# Patient Record
Sex: Female | Born: 1985 | Race: White | Hispanic: No | Marital: Married | Smoking: Never smoker
Health system: Southern US, Community
[De-identification: ages and names within clinical notes are randomized; demographics above are authoritative.]

## PROBLEM LIST (undated history)

## (undated) HISTORY — PX: URETHRA SURGERY: SHX824

## (undated) HISTORY — PX: WISDOM TOOTH EXTRACTION: SHX21

## (undated) HISTORY — PX: OVARY SURGERY: SHX727

---
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
## 2008-01-03 ENCOUNTER — Emergency Department (HOSPITAL_COMMUNITY): Admission: EM | Admit: 2008-01-03 | Discharge: 2008-01-04 | Payer: Self-pay | Admitting: Emergency Medicine

## 2010-05-11 ENCOUNTER — Ambulatory Visit (HOSPITAL_COMMUNITY): Admission: RE | Admit: 2010-05-11 | Discharge: 2010-05-11 | Payer: Self-pay | Admitting: Orthopedic Surgery

## 2010-08-19 ENCOUNTER — Other Ambulatory Visit: Payer: Self-pay | Admitting: Obstetrics & Gynecology

## 2010-08-19 DIAGNOSIS — N9489 Other specified conditions associated with female genital organs and menstrual cycle: Secondary | ICD-10-CM

## 2010-08-19 DIAGNOSIS — R102 Pelvic and perineal pain: Secondary | ICD-10-CM

## 2010-08-20 ENCOUNTER — Ambulatory Visit
Admission: RE | Admit: 2010-08-20 | Discharge: 2010-08-20 | Disposition: A | Discharge status: Home / Self Care | Payer: BC Managed Care – PPO | Source: Ambulatory Visit | Attending: Obstetrics & Gynecology | Admitting: Obstetrics & Gynecology

## 2010-08-20 DIAGNOSIS — R102 Pelvic and perineal pain: Secondary | ICD-10-CM

## 2010-08-20 DIAGNOSIS — N9489 Other specified conditions associated with female genital organs and menstrual cycle: Secondary | ICD-10-CM

## 2010-08-20 MED ORDER — GADOBENATE DIMEGLUMINE 529 MG/ML IV SOLN
20.0000 mL | Freq: Once | INTRAVENOUS | Status: AC | PRN
  Administered 2010-08-20: 20 mL via INTRAVENOUS

## 2010-09-17 ENCOUNTER — Other Ambulatory Visit (HOSPITAL_COMMUNITY): Payer: Self-pay | Admitting: Anesthesiology

## 2010-09-17 ENCOUNTER — Encounter (HOSPITAL_COMMUNITY)
Admission: RE | Admit: 2010-09-17 | Discharge: 2010-09-17 | Disposition: A | Discharge status: Home / Self Care | Payer: BC Managed Care – PPO | Source: Ambulatory Visit | Attending: Obstetrics & Gynecology | Admitting: Obstetrics & Gynecology

## 2010-09-17 DIAGNOSIS — N838 Other noninflammatory disorders of ovary, fallopian tube and broad ligament: Secondary | ICD-10-CM

## 2010-09-17 LAB — SURGICAL PCR SCREEN
MRSA, PCR: NEGATIVE
Staphylococcus aureus: NEGATIVE

## 2010-09-22 ENCOUNTER — Ambulatory Visit (HOSPITAL_COMMUNITY)
Admission: RE | Admit: 2010-09-22 | Discharge: 2010-09-22 | Disposition: A | Discharge status: Home / Self Care | Payer: BC Managed Care – PPO | Source: Ambulatory Visit | Attending: Anesthesiology | Admitting: Anesthesiology

## 2010-09-22 ENCOUNTER — Other Ambulatory Visit (HOSPITAL_COMMUNITY): Payer: Self-pay | Admitting: Anesthesiology

## 2010-09-22 DIAGNOSIS — Z01818 Encounter for other preprocedural examination: Secondary | ICD-10-CM | POA: Insufficient documentation

## 2010-09-22 DIAGNOSIS — N838 Other noninflammatory disorders of ovary, fallopian tube and broad ligament: Secondary | ICD-10-CM

## 2010-09-22 DIAGNOSIS — N839 Noninflammatory disorder of ovary, fallopian tube and broad ligament, unspecified: Secondary | ICD-10-CM | POA: Insufficient documentation

## 2010-09-24 ENCOUNTER — Other Ambulatory Visit: Payer: Self-pay | Admitting: Obstetrics & Gynecology

## 2010-09-24 ENCOUNTER — Observation Stay (HOSPITAL_COMMUNITY)
Admission: RE | Admit: 2010-09-24 | Discharge: 2010-09-24 | Disposition: A | Discharge status: Home / Self Care | Payer: BC Managed Care – PPO | Source: Ambulatory Visit | Attending: Obstetrics & Gynecology | Admitting: Obstetrics & Gynecology

## 2010-09-24 DIAGNOSIS — D279 Benign neoplasm of unspecified ovary: Principal | ICD-10-CM | POA: Insufficient documentation

## 2010-09-24 LAB — CBC
HCT: 41.5 % (ref 36.0–46.0)
Hemoglobin: 13.7 g/dL (ref 12.0–15.0)
MCV: 83.5 fL (ref 78.0–100.0)
RDW: 12.9 % (ref 11.5–15.5)
WBC: 7.8 10*3/uL (ref 4.0–10.5)

## 2010-09-24 LAB — TYPE AND SCREEN
ABO/RH(D): B POS
Antibody Screen: NEGATIVE

## 2010-09-24 LAB — ABO/RH: ABO/RH(D): B POS

## 2010-09-26 ENCOUNTER — Inpatient Hospital Stay (HOSPITAL_COMMUNITY)
Admission: AD | Admit: 2010-09-26 | Discharge: 2010-09-26 | Disposition: A | Discharge status: Home / Self Care | Payer: BC Managed Care – PPO | Source: Ambulatory Visit | Attending: Obstetrics & Gynecology | Admitting: Obstetrics & Gynecology

## 2010-09-26 DIAGNOSIS — J069 Acute upper respiratory infection, unspecified: Secondary | ICD-10-CM | POA: Insufficient documentation

## 2010-09-26 DIAGNOSIS — R5082 Postprocedural fever: Secondary | ICD-10-CM | POA: Insufficient documentation

## 2010-09-26 LAB — URINALYSIS, ROUTINE W REFLEX MICROSCOPIC
Ketones, ur: NEGATIVE mg/dL
Leukocytes, UA: NEGATIVE
Nitrite: NEGATIVE
Protein, ur: NEGATIVE mg/dL
Urobilinogen, UA: 0.2 mg/dL (ref 0.0–1.0)

## 2010-09-26 LAB — CBC
MCH: 27.6 pg (ref 26.0–34.0)
MCHC: 32.2 g/dL (ref 30.0–36.0)
MCV: 85.7 fL (ref 78.0–100.0)
Platelets: 374 10*3/uL (ref 150–400)
RBC: 4.56 MIL/uL (ref 3.87–5.11)

## 2010-09-26 LAB — DIFFERENTIAL
Basophils Relative: 1 % (ref 0–1)
Eosinophils Absolute: 0.5 10*3/uL (ref 0.0–0.7)
Lymphs Abs: 4.2 10*3/uL — ABNORMAL HIGH (ref 0.7–4.0)
Monocytes Absolute: 0.9 10*3/uL (ref 0.1–1.0)
Monocytes Relative: 9 % (ref 3–12)
Neutrophils Relative %: 46 % (ref 43–77)

## 2010-09-26 LAB — URINE MICROSCOPIC-ADD ON

## 2010-10-05 ENCOUNTER — Other Ambulatory Visit: Payer: Self-pay | Admitting: Family Medicine

## 2010-10-05 DIAGNOSIS — R1900 Intra-abdominal and pelvic swelling, mass and lump, unspecified site: Secondary | ICD-10-CM

## 2010-10-06 ENCOUNTER — Ambulatory Visit
Admission: RE | Admit: 2010-10-06 | Discharge: 2010-10-06 | Disposition: A | Discharge status: Home / Self Care | Payer: BC Managed Care – PPO | Source: Ambulatory Visit | Attending: Family Medicine | Admitting: Family Medicine

## 2010-10-06 DIAGNOSIS — R1900 Intra-abdominal and pelvic swelling, mass and lump, unspecified site: Secondary | ICD-10-CM

## 2010-10-06 MED ORDER — GADOBENATE DIMEGLUMINE 529 MG/ML IV SOLN
17.0000 mL | Freq: Once | INTRAVENOUS | Status: AC | PRN
  Administered 2010-10-06: 17 mL via INTRAVENOUS

## 2010-11-11 NOTE — Op Note (Signed)
[name], [name]              ACCOUNT NO.:  [id number]  MEDICAL RECORD NO.:  [id number]           PATIENT TYPE:  O  LOCATION:  9316                          FACILITY:  [facility]  PHYSICIAN:  Genia Del, M.D.DATE OF BIRTH:  09-21-1985  DATE OF PROCEDURE:  09/24/2010 DATE OF DISCHARGE:  09/24/2010                              OPERATIVE REPORT   PREOPERATIVE DIAGNOSIS:  Left solid ovarian mass by MRI (2.9 cm).  POSTOPERATIVE DIAGNOSIS:  Absent left ovary and tube, pelvic adhesions between uterus and bladder and left colon and left abdominopelvic wall and right small bowel and right abdominal wall.  No left pelvic mass found.  PROCEDURE:  Laparoscopy with lysis of adhesions, right ovarian biopsy, left pelvic wall peritoneal biopsy assisted with da Vinci robot.  SURGEON:  Genia Del, MD  ASSISTANT:  Arlan Organ, MD  ANESTHESIOLOGIST:  Dana Allan, MD  PROCEDURE:  Under general anesthesia with endotracheal intubation, the patient was in lithotomy position.  She was prepped with Betadine on the abdominal, suprapubic, vulvar, and vaginal areas and draped as usual. The Foley was put in place in the bladder.  We did a vaginal exam under general anesthesia.  The uterus was anteverted, normal volume, no adnexal mass felt.  We did a hysterometry which is at 7 cm.  We used a #6 Rumi with a small coring.  This was inserted in the uterus without difficulty.  We then went to the abdomen.  We infiltrated the subcutaneous tissue with Marcaine 0.25 plain at the supraumbilical location.  We made a 1.5-cm incision with a scalpel at that level.  We then opened the aponeurosis with Mayo scissors under direct vision using the Kochers to grasp the aponeurosis.  We then opened the parietal peritoneum bluntly with the finger.  We put a pursestring stitch of Vicryl 0 at the aponeurosis.  We then inserted the Hasson and the camera at that level.  Pneumoperitoneum was created with CO2.  We  visualized the pelvic cavity.  The uterus appeared normal.  The adnexa were not visible at this point.  We saw no adhesion with the anterior wall.  We started with the left upper robotic port.  We marked the skin.  We infiltrated Marcaine 0.25 plain and made an 8-mm incision with the scalpel.  We inserted the robotic trocars under direct vision.  We inserted the Nezhat at that level to irrigate, suction, and do washings. This was sent to cytology.  We then put the right upper robotic port in. We marked the skin, infiltrated with the Marcaine 0.25 plain and made an 8-mm incision with a scalpel and inserted the robotic trocars under direct vision.  A probe was used at that level.  We inspected the pelvic cavity.  We noted a normal uterus, although the anterior wall of the uterus was adherent to the bladder over the whole width of the uterus. On the left side, the round ligament was present.  The left colon was adherent to the left abdominopelvic wall.  We were not able to see the left ovary and tube.  On the right side, the right ovary was slightly  prominent, but no lesion was seen.  No evidence of cyst.  No adhesion was present.  We also noted a normal right tube with fimbria.  Some adhesions were present on the right side between the small bowel and the right lateral abdominal wall.  At this point, it was decided to add a 5- mm assistant port and proceed with lysis of adhesions assisted with da Vinci robot and further evaluate the left pelvic area.  We infiltrated the subcutaneous tissue before the lower right assistant ports.  We made a 5-mm incision with the scalpel and inserted the 5-mm assistant ports under direct vision.  We then docked the robot on the left side as usual without difficulty.  We put the EndoShear scissor in the first arm and the PK in the second arm.  We went to the console.  We started with lysis of adhesions between the anterior uterus and the bladder.  We released  those adhesions completely.  We then started lysis of adhesion on the left pelvic wall and left abdominal wall between the colon and the wall.  We completed that lysis of adhesion without difficulty and completely freed the left pelvis even reaching above the pelvic rim and to our surprise no left adnexa was present.  Both the left ovary and the left tube were absent.  No mass was present in the left pelvic area. After putting that in evidence, we decided to call the radiologist at [facility] to review the pelvic MRI which concluded to left ovarian mass of 2.9 cm that was solid.  That MRI was reviewed and the radiologist confirmed a normal, but prominent right ovary and a 2.9 solid, fibrous mass on the left pelvic area.  Given that this description does not correspond to the anatomy found during surgery, we decided to take a biopsy of the right ovary.  This was done using the EndoShear scissors and the P K.  The biopsy was sent to pathology.  We then took a biopsy on the left abdominopelvic wall at site where a scar which could represent an old adhesion of endometriosis was present.  We took that biopsy and sent it to pathology.  We then further explored the right abdominal wall and noted that the small bowel was adherent on that side.  Lysis of adhesion was performed to release the small bowel.  The appendix was not visualized.  The liver appeared normal.  No adhesion was present at that level.  Irrigation was done.  Hemostasis was is adequate at all levels.  We therefore removed all instruments.  We undocked the robot and go by laparoscopy.  We irrigated and suctioned the abdominopelvic cavities.  We did note very small bleeders where the adhesions were released on the anterior uterus.  We used the Kleppinger to control those successfully.  We then used Interceed to prevent recurrence of adhesions on the left abdominopelvic wall.  We also used Interceed between the anterior uterus  and the bladder.  Hemostasis was adequate at all levels.  The instruments were therefore removed.  The CO2 was evacuated.  We closed the supraumbilical incision by attaching the pursestring stitch.  We then put 2 figure-of-eights with Vicryl 0 at the level of the adipose tissue at the supraumbilical incision to complete hemostasis.  We used electrocautery to complete hemostasis on all incisions.  We then closed the skin on all incisions with a subcuticular stitch of Vicryl 4-0 and used Dermabond on top.  The coring and Rumi  were removed.  The estimated blood loss was minimal.  No complications occurred and the patient was brought to recovery room in good stable status.     Genia Del, M.D.     ML/MEDQ  D:  09/24/2010  T:  09/25/2010  Job:  [id number]  Electronically Signed by Genia Del M.D. on 11/11/2010 05:03:14 PM

## 2011-03-12 LAB — PREGNANCY, URINE: Preg Test, Ur: NEGATIVE

## 2011-03-12 LAB — URINALYSIS, ROUTINE W REFLEX MICROSCOPIC
Bilirubin Urine: NEGATIVE
Glucose, UA: NEGATIVE
Nitrite: NEGATIVE
Protein, ur: NEGATIVE
pH: 6

## 2011-03-12 LAB — URINE MICROSCOPIC-ADD ON

## 2012-05-16 ENCOUNTER — Encounter (HOSPITAL_COMMUNITY): Payer: Self-pay | Admitting: Emergency Medicine

## 2012-05-16 ENCOUNTER — Emergency Department (HOSPITAL_COMMUNITY)
Admission: EM | Admit: 2012-05-16 | Discharge: 2012-05-16 | Disposition: A | Discharge status: Home / Self Care | Payer: BC Managed Care – PPO | Attending: Emergency Medicine | Admitting: Emergency Medicine

## 2012-05-16 DIAGNOSIS — Z77098 Contact with and (suspected) exposure to other hazardous, chiefly nonmedicinal, chemicals: Secondary | ICD-10-CM | POA: Insufficient documentation

## 2012-05-16 DIAGNOSIS — Z79899 Other long term (current) drug therapy: Secondary | ICD-10-CM | POA: Insufficient documentation

## 2012-05-16 DIAGNOSIS — R51 Headache: Secondary | ICD-10-CM | POA: Insufficient documentation

## 2012-05-16 DIAGNOSIS — R5381 Other malaise: Secondary | ICD-10-CM | POA: Insufficient documentation

## 2012-05-16 DIAGNOSIS — J00 Acute nasopharyngitis [common cold]: Secondary | ICD-10-CM | POA: Insufficient documentation

## 2012-05-16 DIAGNOSIS — J3489 Other specified disorders of nose and nasal sinuses: Secondary | ICD-10-CM | POA: Insufficient documentation

## 2012-05-16 DIAGNOSIS — R110 Nausea: Secondary | ICD-10-CM | POA: Insufficient documentation

## 2012-05-16 MED ORDER — ACETAMINOPHEN 325 MG PO TABS
650.0000 mg | ORAL_TABLET | Freq: Once | ORAL | Status: AC
  Administered 2012-05-16: 650 mg via ORAL
  Filled 2012-05-16: qty 2

## 2012-05-16 MED ORDER — ONDANSETRON 4 MG PO TBDP
4.0000 mg | ORAL_TABLET | Freq: Once | ORAL | Status: AC
  Administered 2012-05-16: 4 mg via ORAL
  Filled 2012-05-16: qty 1

## 2012-05-16 NOTE — ED Provider Notes (Signed)
History     CSN: [id number]  Arrival date & time 05/16/12  1639   First MD Initiated Contact with Patient 05/16/12 1955      Chief Complaint  Patient presents with  . Chemical Exposure    (Consider location/radiation/quality/duration/timing/severity/associated sxs/prior treatment) HPI Comments: Patient had exposure to Meth while cleaning out a home where it was "cooked" about 1500 today since than has had HA, runny nose, nausea and generally felling weak.   The history is provided by the patient.    History reviewed. No pertinent past medical history.  Past Surgical History  Procedure Date  . Urethra surgery   . Wisdom tooth extraction   . Ovary surgery     History reviewed. No pertinent family history.  History  Substance Use Topics  . Smoking status: Never Smoker   . Smokeless tobacco: Not on file  . Alcohol Use: No    OB History    Grav Para Term Preterm Abortions TAB SAB Ect Mult Living                  Review of Systems  Constitutional: Negative for fever and chills.  HENT: Positive for rhinorrhea.   Eyes: Negative for visual disturbance.  Gastrointestinal: Positive for nausea. Negative for vomiting.  Skin: Negative for rash.  Neurological: Positive for weakness and headaches. Negative for dizziness.    Allergies  Keflex; Prednisone; Codeine; Percocet; and Vicodin  Home Medications   Current Outpatient Rx  Name  Route  Sig  Dispense  Refill  . BUPROPION HCL ER (XL) 300 MG PO TB24   Oral   Take 300 mg by mouth daily.         Marland Kitchen FLUOXETINE HCL 20 MG PO CAPS   Oral   Take 20 mg by mouth daily.           BP 146/85  Pulse 97  Temp 99.2 F (37.3 C) (Oral)  Resp 18  SpO2 100%  LMP 05/09/2012  Physical Exam  Constitutional: She is oriented to person, place, and time. She appears well-developed and well-nourished.  HENT:  Head: Normocephalic.  Eyes: Pupils are equal, round, and reactive to light.  Neck: Normal range of motion.   Cardiovascular: Normal rate.   Pulmonary/Chest: Effort normal and breath sounds normal.  Abdominal: Soft.  Musculoskeletal: Normal range of motion.  Neurological: She is alert and oriented to person, place, and time.  Skin: Skin is warm. No rash noted.       Cheeks flushed    ED Course  Procedures (including critical care time)  Labs Reviewed - No data to display No results found.   1. Exposure to chemical inhalation       MDM   Will treat nausea   After Zofran for nausea, tylenol for the Ha and time patient back to base line       Arman Filter, NP 05/16/12 2238

## 2012-05-16 NOTE — ED Notes (Signed)
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  States that she was exposed to the fumes and now she is feeling nauseous, has a headache, runny nose, and feels weak.  Came into contact with the fumes at around 1500 today.

## 2012-05-18 NOTE — ED Provider Notes (Signed)
Medical screening examination/treatment/procedure(s) were performed by non-physician practitioner and as supervising physician I was immediately available for consultation/collaboration.   Marija Calamari M Shanquita Ronning, DO 05/18/12 1536 

## 2014-07-30 DIAGNOSIS — G5751 Tarsal tunnel syndrome, right lower limb: Secondary | ICD-10-CM | POA: Insufficient documentation

## 2014-09-17 DIAGNOSIS — K839 Disease of biliary tract, unspecified: Secondary | ICD-10-CM | POA: Insufficient documentation

## 2014-09-17 DIAGNOSIS — R1013 Epigastric pain: Secondary | ICD-10-CM | POA: Insufficient documentation

## 2015-01-06 DIAGNOSIS — M2550 Pain in unspecified joint: Secondary | ICD-10-CM | POA: Insufficient documentation

## 2015-01-06 DIAGNOSIS — E559 Vitamin D deficiency, unspecified: Secondary | ICD-10-CM | POA: Insufficient documentation

## 2015-01-06 DIAGNOSIS — D509 Iron deficiency anemia, unspecified: Secondary | ICD-10-CM | POA: Insufficient documentation

## 2015-01-06 DIAGNOSIS — M25572 Pain in left ankle and joints of left foot: Secondary | ICD-10-CM | POA: Insufficient documentation

## 2015-06-06 DIAGNOSIS — H6192 Disorder of left external ear, unspecified: Secondary | ICD-10-CM | POA: Insufficient documentation

## 2015-06-24 DIAGNOSIS — M654 Radial styloid tenosynovitis [de Quervain]: Secondary | ICD-10-CM | POA: Insufficient documentation

## 2015-06-24 DIAGNOSIS — G5621 Lesion of ulnar nerve, right upper limb: Secondary | ICD-10-CM | POA: Insufficient documentation

## 2016-07-07 DIAGNOSIS — F419 Anxiety disorder, unspecified: Secondary | ICD-10-CM | POA: Insufficient documentation

## 2017-03-28 DIAGNOSIS — Z8249 Family history of ischemic heart disease and other diseases of the circulatory system: Secondary | ICD-10-CM | POA: Insufficient documentation

## 2017-12-19 DIAGNOSIS — Z91030 Bee allergy status: Secondary | ICD-10-CM | POA: Insufficient documentation

## 2017-12-19 DIAGNOSIS — R7989 Other specified abnormal findings of blood chemistry: Secondary | ICD-10-CM | POA: Insufficient documentation

## 2018-05-15 DIAGNOSIS — M9904 Segmental and somatic dysfunction of sacral region: Secondary | ICD-10-CM | POA: Diagnosis not present

## 2018-05-15 DIAGNOSIS — M9903 Segmental and somatic dysfunction of lumbar region: Secondary | ICD-10-CM | POA: Diagnosis not present

## 2018-05-15 DIAGNOSIS — M545 Low back pain: Secondary | ICD-10-CM | POA: Diagnosis not present

## 2018-05-15 DIAGNOSIS — M9902 Segmental and somatic dysfunction of thoracic region: Secondary | ICD-10-CM | POA: Diagnosis not present

## 2018-05-15 DIAGNOSIS — M5417 Radiculopathy, lumbosacral region: Secondary | ICD-10-CM | POA: Diagnosis not present

## 2018-05-15 DIAGNOSIS — S336XXA Sprain of sacroiliac joint, initial encounter: Secondary | ICD-10-CM | POA: Diagnosis not present

## 2018-05-16 DIAGNOSIS — K7689 Other specified diseases of liver: Secondary | ICD-10-CM | POA: Diagnosis not present

## 2018-05-16 DIAGNOSIS — R932 Abnormal findings on diagnostic imaging of liver and biliary tract: Secondary | ICD-10-CM | POA: Diagnosis not present

## 2018-05-16 DIAGNOSIS — K769 Liver disease, unspecified: Secondary | ICD-10-CM | POA: Diagnosis not present

## 2018-05-17 DIAGNOSIS — E611 Iron deficiency: Secondary | ICD-10-CM | POA: Diagnosis not present

## 2018-05-17 DIAGNOSIS — D473 Essential (hemorrhagic) thrombocythemia: Secondary | ICD-10-CM | POA: Diagnosis not present

## 2018-05-19 DIAGNOSIS — N83201 Unspecified ovarian cyst, right side: Secondary | ICD-10-CM | POA: Diagnosis not present

## 2018-05-19 DIAGNOSIS — Z8742 Personal history of other diseases of the female genital tract: Secondary | ICD-10-CM | POA: Diagnosis not present

## 2018-05-22 DIAGNOSIS — Z8379 Family history of other diseases of the digestive system: Secondary | ICD-10-CM | POA: Diagnosis not present

## 2018-05-22 DIAGNOSIS — Z8742 Personal history of other diseases of the female genital tract: Secondary | ICD-10-CM | POA: Diagnosis not present

## 2018-05-22 DIAGNOSIS — K769 Liver disease, unspecified: Secondary | ICD-10-CM | POA: Diagnosis not present

## 2018-05-22 DIAGNOSIS — R932 Abnormal findings on diagnostic imaging of liver and biliary tract: Secondary | ICD-10-CM | POA: Diagnosis not present

## 2018-05-24 DIAGNOSIS — D473 Essential (hemorrhagic) thrombocythemia: Secondary | ICD-10-CM | POA: Diagnosis not present

## 2018-05-24 DIAGNOSIS — E611 Iron deficiency: Secondary | ICD-10-CM | POA: Diagnosis not present

## 2018-05-29 DIAGNOSIS — R110 Nausea: Secondary | ICD-10-CM | POA: Diagnosis not present

## 2018-05-29 DIAGNOSIS — Z885 Allergy status to narcotic agent status: Secondary | ICD-10-CM | POA: Diagnosis not present

## 2018-05-29 DIAGNOSIS — F419 Anxiety disorder, unspecified: Secondary | ICD-10-CM | POA: Diagnosis not present

## 2018-05-29 DIAGNOSIS — K219 Gastro-esophageal reflux disease without esophagitis: Secondary | ICD-10-CM | POA: Diagnosis not present

## 2018-05-29 DIAGNOSIS — Z791 Long term (current) use of non-steroidal anti-inflammatories (NSAID): Secondary | ICD-10-CM | POA: Diagnosis not present

## 2018-05-29 DIAGNOSIS — R140 Abdominal distension (gaseous): Secondary | ICD-10-CM | POA: Diagnosis not present

## 2018-05-29 DIAGNOSIS — Z881 Allergy status to other antibiotic agents status: Secondary | ICD-10-CM | POA: Diagnosis not present

## 2018-05-29 DIAGNOSIS — Z888 Allergy status to other drugs, medicaments and biological substances status: Secondary | ICD-10-CM | POA: Diagnosis not present

## 2018-05-29 DIAGNOSIS — Z79899 Other long term (current) drug therapy: Secondary | ICD-10-CM | POA: Diagnosis not present

## 2018-06-09 DIAGNOSIS — M9903 Segmental and somatic dysfunction of lumbar region: Secondary | ICD-10-CM | POA: Diagnosis not present

## 2018-06-09 DIAGNOSIS — M5417 Radiculopathy, lumbosacral region: Secondary | ICD-10-CM | POA: Diagnosis not present

## 2018-06-09 DIAGNOSIS — S336XXA Sprain of sacroiliac joint, initial encounter: Secondary | ICD-10-CM | POA: Diagnosis not present

## 2018-06-09 DIAGNOSIS — M9904 Segmental and somatic dysfunction of sacral region: Secondary | ICD-10-CM | POA: Diagnosis not present

## 2018-06-09 DIAGNOSIS — M9902 Segmental and somatic dysfunction of thoracic region: Secondary | ICD-10-CM | POA: Diagnosis not present

## 2018-06-09 DIAGNOSIS — M545 Low back pain: Secondary | ICD-10-CM | POA: Diagnosis not present

## 2018-06-12 DIAGNOSIS — R6881 Early satiety: Secondary | ICD-10-CM | POA: Diagnosis not present

## 2018-06-12 DIAGNOSIS — R140 Abdominal distension (gaseous): Secondary | ICD-10-CM | POA: Diagnosis not present

## 2018-06-12 DIAGNOSIS — R110 Nausea: Secondary | ICD-10-CM | POA: Diagnosis not present

## 2018-06-13 DIAGNOSIS — M5417 Radiculopathy, lumbosacral region: Secondary | ICD-10-CM | POA: Diagnosis not present

## 2018-06-13 DIAGNOSIS — M25571 Pain in right ankle and joints of right foot: Secondary | ICD-10-CM | POA: Diagnosis not present

## 2018-06-13 DIAGNOSIS — M545 Low back pain: Secondary | ICD-10-CM | POA: Diagnosis not present

## 2018-06-13 DIAGNOSIS — M9903 Segmental and somatic dysfunction of lumbar region: Secondary | ICD-10-CM | POA: Diagnosis not present

## 2018-06-13 DIAGNOSIS — S336XXA Sprain of sacroiliac joint, initial encounter: Secondary | ICD-10-CM | POA: Diagnosis not present

## 2018-06-13 DIAGNOSIS — M9902 Segmental and somatic dysfunction of thoracic region: Secondary | ICD-10-CM | POA: Diagnosis not present

## 2018-06-13 DIAGNOSIS — M9904 Segmental and somatic dysfunction of sacral region: Secondary | ICD-10-CM | POA: Diagnosis not present

## 2018-06-13 DIAGNOSIS — M25572 Pain in left ankle and joints of left foot: Secondary | ICD-10-CM | POA: Diagnosis not present

## 2018-06-23 DIAGNOSIS — M545 Low back pain: Secondary | ICD-10-CM | POA: Diagnosis not present

## 2018-06-23 DIAGNOSIS — M5417 Radiculopathy, lumbosacral region: Secondary | ICD-10-CM | POA: Diagnosis not present

## 2018-06-23 DIAGNOSIS — M9904 Segmental and somatic dysfunction of sacral region: Secondary | ICD-10-CM | POA: Diagnosis not present

## 2018-06-23 DIAGNOSIS — M9903 Segmental and somatic dysfunction of lumbar region: Secondary | ICD-10-CM | POA: Diagnosis not present

## 2018-06-23 DIAGNOSIS — M25572 Pain in left ankle and joints of left foot: Secondary | ICD-10-CM | POA: Diagnosis not present

## 2018-06-23 DIAGNOSIS — S336XXA Sprain of sacroiliac joint, initial encounter: Secondary | ICD-10-CM | POA: Diagnosis not present

## 2018-06-23 DIAGNOSIS — M25571 Pain in right ankle and joints of right foot: Secondary | ICD-10-CM | POA: Diagnosis not present

## 2018-06-23 DIAGNOSIS — M9902 Segmental and somatic dysfunction of thoracic region: Secondary | ICD-10-CM | POA: Diagnosis not present

## 2018-06-30 DIAGNOSIS — M5417 Radiculopathy, lumbosacral region: Secondary | ICD-10-CM | POA: Diagnosis not present

## 2018-06-30 DIAGNOSIS — M79671 Pain in right foot: Secondary | ICD-10-CM | POA: Diagnosis not present

## 2018-06-30 DIAGNOSIS — M9903 Segmental and somatic dysfunction of lumbar region: Secondary | ICD-10-CM | POA: Diagnosis not present

## 2018-06-30 DIAGNOSIS — M722 Plantar fascial fibromatosis: Secondary | ICD-10-CM | POA: Diagnosis not present

## 2018-06-30 DIAGNOSIS — M9901 Segmental and somatic dysfunction of cervical region: Secondary | ICD-10-CM | POA: Diagnosis not present

## 2018-06-30 DIAGNOSIS — M545 Low back pain: Secondary | ICD-10-CM | POA: Diagnosis not present

## 2018-06-30 DIAGNOSIS — M79672 Pain in left foot: Secondary | ICD-10-CM | POA: Diagnosis not present

## 2018-06-30 DIAGNOSIS — M9906 Segmental and somatic dysfunction of lower extremity: Secondary | ICD-10-CM | POA: Diagnosis not present

## 2018-07-06 DIAGNOSIS — M9906 Segmental and somatic dysfunction of lower extremity: Secondary | ICD-10-CM | POA: Diagnosis not present

## 2018-07-06 DIAGNOSIS — M79671 Pain in right foot: Secondary | ICD-10-CM | POA: Diagnosis not present

## 2018-07-06 DIAGNOSIS — M25571 Pain in right ankle and joints of right foot: Secondary | ICD-10-CM | POA: Diagnosis not present

## 2018-07-06 DIAGNOSIS — M9904 Segmental and somatic dysfunction of sacral region: Secondary | ICD-10-CM | POA: Diagnosis not present

## 2018-07-06 DIAGNOSIS — M5417 Radiculopathy, lumbosacral region: Secondary | ICD-10-CM | POA: Diagnosis not present

## 2018-07-06 DIAGNOSIS — M9901 Segmental and somatic dysfunction of cervical region: Secondary | ICD-10-CM | POA: Diagnosis not present

## 2018-07-06 DIAGNOSIS — M545 Low back pain: Secondary | ICD-10-CM | POA: Diagnosis not present

## 2018-07-06 DIAGNOSIS — M722 Plantar fascial fibromatosis: Secondary | ICD-10-CM | POA: Diagnosis not present

## 2018-07-06 DIAGNOSIS — M9903 Segmental and somatic dysfunction of lumbar region: Secondary | ICD-10-CM | POA: Diagnosis not present

## 2018-07-06 DIAGNOSIS — M25572 Pain in left ankle and joints of left foot: Secondary | ICD-10-CM | POA: Diagnosis not present

## 2018-07-12 DIAGNOSIS — M5417 Radiculopathy, lumbosacral region: Secondary | ICD-10-CM | POA: Diagnosis not present

## 2018-07-12 DIAGNOSIS — M545 Low back pain: Secondary | ICD-10-CM | POA: Diagnosis not present

## 2018-07-12 DIAGNOSIS — M79671 Pain in right foot: Secondary | ICD-10-CM | POA: Diagnosis not present

## 2018-07-12 DIAGNOSIS — M722 Plantar fascial fibromatosis: Secondary | ICD-10-CM | POA: Diagnosis not present

## 2018-07-12 DIAGNOSIS — M9901 Segmental and somatic dysfunction of cervical region: Secondary | ICD-10-CM | POA: Diagnosis not present

## 2018-07-12 DIAGNOSIS — M9906 Segmental and somatic dysfunction of lower extremity: Secondary | ICD-10-CM | POA: Diagnosis not present

## 2018-07-12 DIAGNOSIS — M9903 Segmental and somatic dysfunction of lumbar region: Secondary | ICD-10-CM | POA: Diagnosis not present

## 2018-07-14 DIAGNOSIS — M9901 Segmental and somatic dysfunction of cervical region: Secondary | ICD-10-CM | POA: Diagnosis not present

## 2018-07-14 DIAGNOSIS — M545 Low back pain: Secondary | ICD-10-CM | POA: Diagnosis not present

## 2018-07-14 DIAGNOSIS — M9906 Segmental and somatic dysfunction of lower extremity: Secondary | ICD-10-CM | POA: Diagnosis not present

## 2018-07-14 DIAGNOSIS — M9903 Segmental and somatic dysfunction of lumbar region: Secondary | ICD-10-CM | POA: Diagnosis not present

## 2018-07-14 DIAGNOSIS — M722 Plantar fascial fibromatosis: Secondary | ICD-10-CM | POA: Diagnosis not present

## 2018-07-14 DIAGNOSIS — M79671 Pain in right foot: Secondary | ICD-10-CM | POA: Diagnosis not present

## 2018-07-14 DIAGNOSIS — M5417 Radiculopathy, lumbosacral region: Secondary | ICD-10-CM | POA: Diagnosis not present

## 2018-07-21 DIAGNOSIS — M9901 Segmental and somatic dysfunction of cervical region: Secondary | ICD-10-CM | POA: Diagnosis not present

## 2018-07-21 DIAGNOSIS — M722 Plantar fascial fibromatosis: Secondary | ICD-10-CM | POA: Diagnosis not present

## 2018-07-21 DIAGNOSIS — M9903 Segmental and somatic dysfunction of lumbar region: Secondary | ICD-10-CM | POA: Diagnosis not present

## 2018-07-21 DIAGNOSIS — M9906 Segmental and somatic dysfunction of lower extremity: Secondary | ICD-10-CM | POA: Diagnosis not present

## 2018-07-21 DIAGNOSIS — M545 Low back pain: Secondary | ICD-10-CM | POA: Diagnosis not present

## 2018-07-21 DIAGNOSIS — M79671 Pain in right foot: Secondary | ICD-10-CM | POA: Diagnosis not present

## 2018-07-21 DIAGNOSIS — M5417 Radiculopathy, lumbosacral region: Secondary | ICD-10-CM | POA: Diagnosis not present

## 2018-07-26 DIAGNOSIS — M722 Plantar fascial fibromatosis: Secondary | ICD-10-CM | POA: Diagnosis not present

## 2018-07-26 DIAGNOSIS — M9903 Segmental and somatic dysfunction of lumbar region: Secondary | ICD-10-CM | POA: Diagnosis not present

## 2018-07-26 DIAGNOSIS — M9901 Segmental and somatic dysfunction of cervical region: Secondary | ICD-10-CM | POA: Diagnosis not present

## 2018-07-26 DIAGNOSIS — M9906 Segmental and somatic dysfunction of lower extremity: Secondary | ICD-10-CM | POA: Diagnosis not present

## 2018-07-26 DIAGNOSIS — M545 Low back pain: Secondary | ICD-10-CM | POA: Diagnosis not present

## 2018-07-26 DIAGNOSIS — M79671 Pain in right foot: Secondary | ICD-10-CM | POA: Diagnosis not present

## 2018-07-26 DIAGNOSIS — M5417 Radiculopathy, lumbosacral region: Secondary | ICD-10-CM | POA: Diagnosis not present

## 2018-08-04 DIAGNOSIS — L989 Disorder of the skin and subcutaneous tissue, unspecified: Secondary | ICD-10-CM | POA: Diagnosis not present

## 2018-08-04 DIAGNOSIS — D539 Nutritional anemia, unspecified: Secondary | ICD-10-CM | POA: Diagnosis not present

## 2018-08-04 DIAGNOSIS — L821 Other seborrheic keratosis: Secondary | ICD-10-CM | POA: Diagnosis not present

## 2018-08-04 DIAGNOSIS — D509 Iron deficiency anemia, unspecified: Secondary | ICD-10-CM | POA: Diagnosis not present

## 2018-08-04 DIAGNOSIS — E559 Vitamin D deficiency, unspecified: Secondary | ICD-10-CM | POA: Diagnosis not present

## 2018-08-04 DIAGNOSIS — D473 Essential (hemorrhagic) thrombocythemia: Secondary | ICD-10-CM | POA: Diagnosis not present

## 2018-08-04 DIAGNOSIS — L820 Inflamed seborrheic keratosis: Secondary | ICD-10-CM | POA: Diagnosis not present

## 2018-08-11 DIAGNOSIS — M26623 Arthralgia of bilateral temporomandibular joint: Secondary | ICD-10-CM | POA: Diagnosis not present

## 2018-08-11 DIAGNOSIS — M9903 Segmental and somatic dysfunction of lumbar region: Secondary | ICD-10-CM | POA: Diagnosis not present

## 2018-08-11 DIAGNOSIS — M9907 Segmental and somatic dysfunction of upper extremity: Secondary | ICD-10-CM | POA: Diagnosis not present

## 2018-08-11 DIAGNOSIS — M5417 Radiculopathy, lumbosacral region: Secondary | ICD-10-CM | POA: Diagnosis not present

## 2018-08-11 DIAGNOSIS — M9901 Segmental and somatic dysfunction of cervical region: Secondary | ICD-10-CM | POA: Diagnosis not present

## 2018-08-11 DIAGNOSIS — M9902 Segmental and somatic dysfunction of thoracic region: Secondary | ICD-10-CM | POA: Diagnosis not present

## 2018-08-11 DIAGNOSIS — M545 Low back pain: Secondary | ICD-10-CM | POA: Diagnosis not present

## 2018-08-18 DIAGNOSIS — D72829 Elevated white blood cell count, unspecified: Secondary | ICD-10-CM | POA: Diagnosis not present

## 2018-08-19 DIAGNOSIS — M9901 Segmental and somatic dysfunction of cervical region: Secondary | ICD-10-CM | POA: Diagnosis not present

## 2018-08-19 DIAGNOSIS — M9904 Segmental and somatic dysfunction of sacral region: Secondary | ICD-10-CM | POA: Diagnosis not present

## 2018-08-19 DIAGNOSIS — M545 Low back pain: Secondary | ICD-10-CM | POA: Diagnosis not present

## 2018-08-19 DIAGNOSIS — M5417 Radiculopathy, lumbosacral region: Secondary | ICD-10-CM | POA: Diagnosis not present

## 2018-08-19 DIAGNOSIS — M9903 Segmental and somatic dysfunction of lumbar region: Secondary | ICD-10-CM | POA: Diagnosis not present

## 2018-08-19 DIAGNOSIS — M26623 Arthralgia of bilateral temporomandibular joint: Secondary | ICD-10-CM | POA: Diagnosis not present

## 2018-08-19 DIAGNOSIS — M9907 Segmental and somatic dysfunction of upper extremity: Secondary | ICD-10-CM | POA: Diagnosis not present

## 2018-09-20 DIAGNOSIS — L255 Unspecified contact dermatitis due to plants, except food: Secondary | ICD-10-CM | POA: Diagnosis not present

## 2018-09-29 DIAGNOSIS — M5417 Radiculopathy, lumbosacral region: Secondary | ICD-10-CM | POA: Diagnosis not present

## 2018-09-29 DIAGNOSIS — M9906 Segmental and somatic dysfunction of lower extremity: Secondary | ICD-10-CM | POA: Diagnosis not present

## 2018-09-29 DIAGNOSIS — M722 Plantar fascial fibromatosis: Secondary | ICD-10-CM | POA: Diagnosis not present

## 2018-09-29 DIAGNOSIS — M79671 Pain in right foot: Secondary | ICD-10-CM | POA: Diagnosis not present

## 2018-09-29 DIAGNOSIS — M9903 Segmental and somatic dysfunction of lumbar region: Secondary | ICD-10-CM | POA: Diagnosis not present

## 2018-09-29 DIAGNOSIS — M545 Low back pain: Secondary | ICD-10-CM | POA: Diagnosis not present

## 2018-09-29 DIAGNOSIS — M9901 Segmental and somatic dysfunction of cervical region: Secondary | ICD-10-CM | POA: Diagnosis not present

## 2018-10-20 DIAGNOSIS — M9901 Segmental and somatic dysfunction of cervical region: Secondary | ICD-10-CM | POA: Diagnosis not present

## 2018-10-20 DIAGNOSIS — M5417 Radiculopathy, lumbosacral region: Secondary | ICD-10-CM | POA: Diagnosis not present

## 2018-10-20 DIAGNOSIS — M722 Plantar fascial fibromatosis: Secondary | ICD-10-CM | POA: Diagnosis not present

## 2018-10-20 DIAGNOSIS — M9903 Segmental and somatic dysfunction of lumbar region: Secondary | ICD-10-CM | POA: Diagnosis not present

## 2018-10-20 DIAGNOSIS — M79671 Pain in right foot: Secondary | ICD-10-CM | POA: Diagnosis not present

## 2018-10-20 DIAGNOSIS — M545 Low back pain: Secondary | ICD-10-CM | POA: Diagnosis not present

## 2018-10-24 DIAGNOSIS — M5417 Radiculopathy, lumbosacral region: Secondary | ICD-10-CM | POA: Diagnosis not present

## 2018-10-24 DIAGNOSIS — M9901 Segmental and somatic dysfunction of cervical region: Secondary | ICD-10-CM | POA: Diagnosis not present

## 2018-10-24 DIAGNOSIS — M722 Plantar fascial fibromatosis: Secondary | ICD-10-CM | POA: Diagnosis not present

## 2018-10-24 DIAGNOSIS — M9903 Segmental and somatic dysfunction of lumbar region: Secondary | ICD-10-CM | POA: Diagnosis not present

## 2018-10-24 DIAGNOSIS — M545 Low back pain: Secondary | ICD-10-CM | POA: Diagnosis not present

## 2018-10-24 DIAGNOSIS — M79671 Pain in right foot: Secondary | ICD-10-CM | POA: Diagnosis not present

## 2018-10-27 DIAGNOSIS — M9903 Segmental and somatic dysfunction of lumbar region: Secondary | ICD-10-CM | POA: Diagnosis not present

## 2018-10-27 DIAGNOSIS — M9906 Segmental and somatic dysfunction of lower extremity: Secondary | ICD-10-CM | POA: Diagnosis not present

## 2018-10-27 DIAGNOSIS — M545 Low back pain: Secondary | ICD-10-CM | POA: Diagnosis not present

## 2018-10-27 DIAGNOSIS — M722 Plantar fascial fibromatosis: Secondary | ICD-10-CM | POA: Diagnosis not present

## 2018-10-27 DIAGNOSIS — M79671 Pain in right foot: Secondary | ICD-10-CM | POA: Diagnosis not present

## 2018-10-27 DIAGNOSIS — M5417 Radiculopathy, lumbosacral region: Secondary | ICD-10-CM | POA: Diagnosis not present

## 2018-10-27 DIAGNOSIS — M9901 Segmental and somatic dysfunction of cervical region: Secondary | ICD-10-CM | POA: Diagnosis not present

## 2018-10-30 DIAGNOSIS — J209 Acute bronchitis, unspecified: Secondary | ICD-10-CM | POA: Diagnosis not present

## 2018-10-30 DIAGNOSIS — R062 Wheezing: Secondary | ICD-10-CM | POA: Diagnosis not present

## 2018-11-08 DIAGNOSIS — M9903 Segmental and somatic dysfunction of lumbar region: Secondary | ICD-10-CM | POA: Diagnosis not present

## 2018-11-08 DIAGNOSIS — M9901 Segmental and somatic dysfunction of cervical region: Secondary | ICD-10-CM | POA: Diagnosis not present

## 2018-11-08 DIAGNOSIS — M9906 Segmental and somatic dysfunction of lower extremity: Secondary | ICD-10-CM | POA: Diagnosis not present

## 2018-11-08 DIAGNOSIS — M79671 Pain in right foot: Secondary | ICD-10-CM | POA: Diagnosis not present

## 2018-11-08 DIAGNOSIS — M5417 Radiculopathy, lumbosacral region: Secondary | ICD-10-CM | POA: Diagnosis not present

## 2018-11-08 DIAGNOSIS — M545 Low back pain: Secondary | ICD-10-CM | POA: Diagnosis not present

## 2018-11-08 DIAGNOSIS — M722 Plantar fascial fibromatosis: Secondary | ICD-10-CM | POA: Diagnosis not present

## 2018-11-22 DIAGNOSIS — M5417 Radiculopathy, lumbosacral region: Secondary | ICD-10-CM | POA: Diagnosis not present

## 2018-11-22 DIAGNOSIS — M9904 Segmental and somatic dysfunction of sacral region: Secondary | ICD-10-CM | POA: Diagnosis not present

## 2018-11-22 DIAGNOSIS — M545 Low back pain: Secondary | ICD-10-CM | POA: Diagnosis not present

## 2018-11-22 DIAGNOSIS — M9903 Segmental and somatic dysfunction of lumbar region: Secondary | ICD-10-CM | POA: Diagnosis not present

## 2018-11-22 DIAGNOSIS — W57XXXA Bitten or stung by nonvenomous insect and other nonvenomous arthropods, initial encounter: Secondary | ICD-10-CM | POA: Insufficient documentation

## 2018-11-22 DIAGNOSIS — M25511 Pain in right shoulder: Secondary | ICD-10-CM | POA: Diagnosis not present

## 2018-11-22 DIAGNOSIS — M9907 Segmental and somatic dysfunction of upper extremity: Secondary | ICD-10-CM | POA: Diagnosis not present

## 2018-11-22 DIAGNOSIS — M9901 Segmental and somatic dysfunction of cervical region: Secondary | ICD-10-CM | POA: Diagnosis not present

## 2018-11-24 DIAGNOSIS — M5417 Radiculopathy, lumbosacral region: Secondary | ICD-10-CM | POA: Diagnosis not present

## 2018-11-24 DIAGNOSIS — M25511 Pain in right shoulder: Secondary | ICD-10-CM | POA: Diagnosis not present

## 2018-11-24 DIAGNOSIS — M545 Low back pain: Secondary | ICD-10-CM | POA: Diagnosis not present

## 2018-11-24 DIAGNOSIS — M9907 Segmental and somatic dysfunction of upper extremity: Secondary | ICD-10-CM | POA: Diagnosis not present

## 2018-11-24 DIAGNOSIS — M9903 Segmental and somatic dysfunction of lumbar region: Secondary | ICD-10-CM | POA: Diagnosis not present

## 2018-11-24 DIAGNOSIS — M9901 Segmental and somatic dysfunction of cervical region: Secondary | ICD-10-CM | POA: Diagnosis not present

## 2018-12-06 DIAGNOSIS — E559 Vitamin D deficiency, unspecified: Secondary | ICD-10-CM | POA: Diagnosis not present

## 2018-12-06 DIAGNOSIS — D473 Essential (hemorrhagic) thrombocythemia: Secondary | ICD-10-CM | POA: Diagnosis not present

## 2018-12-08 DIAGNOSIS — M25511 Pain in right shoulder: Secondary | ICD-10-CM | POA: Diagnosis not present

## 2018-12-08 DIAGNOSIS — M5417 Radiculopathy, lumbosacral region: Secondary | ICD-10-CM | POA: Diagnosis not present

## 2018-12-08 DIAGNOSIS — M79671 Pain in right foot: Secondary | ICD-10-CM | POA: Diagnosis not present

## 2018-12-08 DIAGNOSIS — M545 Low back pain: Secondary | ICD-10-CM | POA: Diagnosis not present

## 2018-12-08 DIAGNOSIS — M9901 Segmental and somatic dysfunction of cervical region: Secondary | ICD-10-CM | POA: Diagnosis not present

## 2018-12-08 DIAGNOSIS — M722 Plantar fascial fibromatosis: Secondary | ICD-10-CM | POA: Diagnosis not present

## 2018-12-08 DIAGNOSIS — M9903 Segmental and somatic dysfunction of lumbar region: Secondary | ICD-10-CM | POA: Diagnosis not present

## 2018-12-08 DIAGNOSIS — M9906 Segmental and somatic dysfunction of lower extremity: Secondary | ICD-10-CM | POA: Diagnosis not present

## 2018-12-08 DIAGNOSIS — M9907 Segmental and somatic dysfunction of upper extremity: Secondary | ICD-10-CM | POA: Diagnosis not present

## 2018-12-19 DIAGNOSIS — M5417 Radiculopathy, lumbosacral region: Secondary | ICD-10-CM | POA: Diagnosis not present

## 2018-12-19 DIAGNOSIS — M9906 Segmental and somatic dysfunction of lower extremity: Secondary | ICD-10-CM | POA: Diagnosis not present

## 2018-12-19 DIAGNOSIS — M79671 Pain in right foot: Secondary | ICD-10-CM | POA: Diagnosis not present

## 2018-12-19 DIAGNOSIS — M25511 Pain in right shoulder: Secondary | ICD-10-CM | POA: Diagnosis not present

## 2018-12-19 DIAGNOSIS — M9901 Segmental and somatic dysfunction of cervical region: Secondary | ICD-10-CM | POA: Diagnosis not present

## 2018-12-19 DIAGNOSIS — M545 Low back pain: Secondary | ICD-10-CM | POA: Diagnosis not present

## 2018-12-19 DIAGNOSIS — M9907 Segmental and somatic dysfunction of upper extremity: Secondary | ICD-10-CM | POA: Diagnosis not present

## 2018-12-19 DIAGNOSIS — M9903 Segmental and somatic dysfunction of lumbar region: Secondary | ICD-10-CM | POA: Diagnosis not present

## 2018-12-29 DIAGNOSIS — M9901 Segmental and somatic dysfunction of cervical region: Secondary | ICD-10-CM | POA: Diagnosis not present

## 2018-12-29 DIAGNOSIS — M545 Low back pain: Secondary | ICD-10-CM | POA: Diagnosis not present

## 2018-12-29 DIAGNOSIS — M5417 Radiculopathy, lumbosacral region: Secondary | ICD-10-CM | POA: Diagnosis not present

## 2018-12-29 DIAGNOSIS — M79671 Pain in right foot: Secondary | ICD-10-CM | POA: Diagnosis not present

## 2018-12-29 DIAGNOSIS — M9907 Segmental and somatic dysfunction of upper extremity: Secondary | ICD-10-CM | POA: Diagnosis not present

## 2018-12-29 DIAGNOSIS — M940 Chondrocostal junction syndrome [Tietze]: Secondary | ICD-10-CM | POA: Diagnosis not present

## 2018-12-29 DIAGNOSIS — M9903 Segmental and somatic dysfunction of lumbar region: Secondary | ICD-10-CM | POA: Diagnosis not present

## 2019-01-02 DIAGNOSIS — M722 Plantar fascial fibromatosis: Secondary | ICD-10-CM | POA: Diagnosis not present

## 2019-01-02 DIAGNOSIS — M9903 Segmental and somatic dysfunction of lumbar region: Secondary | ICD-10-CM | POA: Diagnosis not present

## 2019-01-02 DIAGNOSIS — N390 Urinary tract infection, site not specified: Secondary | ICD-10-CM | POA: Diagnosis not present

## 2019-01-02 DIAGNOSIS — M545 Low back pain: Secondary | ICD-10-CM | POA: Diagnosis not present

## 2019-01-02 DIAGNOSIS — M9901 Segmental and somatic dysfunction of cervical region: Secondary | ICD-10-CM | POA: Diagnosis not present

## 2019-01-02 DIAGNOSIS — M5417 Radiculopathy, lumbosacral region: Secondary | ICD-10-CM | POA: Diagnosis not present

## 2019-01-02 DIAGNOSIS — M79671 Pain in right foot: Secondary | ICD-10-CM | POA: Diagnosis not present

## 2019-01-02 DIAGNOSIS — M9906 Segmental and somatic dysfunction of lower extremity: Secondary | ICD-10-CM | POA: Diagnosis not present

## 2019-01-03 DIAGNOSIS — Z90721 Acquired absence of ovaries, unilateral: Secondary | ICD-10-CM | POA: Diagnosis not present

## 2019-01-03 DIAGNOSIS — N83201 Unspecified ovarian cyst, right side: Secondary | ICD-10-CM | POA: Diagnosis not present

## 2019-01-12 DIAGNOSIS — M940 Chondrocostal junction syndrome [Tietze]: Secondary | ICD-10-CM | POA: Diagnosis not present

## 2019-01-12 DIAGNOSIS — M9903 Segmental and somatic dysfunction of lumbar region: Secondary | ICD-10-CM | POA: Diagnosis not present

## 2019-01-12 DIAGNOSIS — M5417 Radiculopathy, lumbosacral region: Secondary | ICD-10-CM | POA: Diagnosis not present

## 2019-01-12 DIAGNOSIS — M79671 Pain in right foot: Secondary | ICD-10-CM | POA: Diagnosis not present

## 2019-01-12 DIAGNOSIS — M722 Plantar fascial fibromatosis: Secondary | ICD-10-CM | POA: Diagnosis not present

## 2019-01-12 DIAGNOSIS — M9901 Segmental and somatic dysfunction of cervical region: Secondary | ICD-10-CM | POA: Diagnosis not present

## 2019-01-12 DIAGNOSIS — M9907 Segmental and somatic dysfunction of upper extremity: Secondary | ICD-10-CM | POA: Diagnosis not present

## 2019-01-12 DIAGNOSIS — M545 Low back pain: Secondary | ICD-10-CM | POA: Diagnosis not present

## 2019-01-16 DIAGNOSIS — M9901 Segmental and somatic dysfunction of cervical region: Secondary | ICD-10-CM | POA: Diagnosis not present

## 2019-01-16 DIAGNOSIS — M545 Low back pain: Secondary | ICD-10-CM | POA: Diagnosis not present

## 2019-01-16 DIAGNOSIS — M9903 Segmental and somatic dysfunction of lumbar region: Secondary | ICD-10-CM | POA: Diagnosis not present

## 2019-01-16 DIAGNOSIS — M9907 Segmental and somatic dysfunction of upper extremity: Secondary | ICD-10-CM | POA: Diagnosis not present

## 2019-01-16 DIAGNOSIS — M5417 Radiculopathy, lumbosacral region: Secondary | ICD-10-CM | POA: Diagnosis not present

## 2019-01-16 DIAGNOSIS — M940 Chondrocostal junction syndrome [Tietze]: Secondary | ICD-10-CM | POA: Diagnosis not present

## 2019-01-19 DIAGNOSIS — M9901 Segmental and somatic dysfunction of cervical region: Secondary | ICD-10-CM | POA: Diagnosis not present

## 2019-01-19 DIAGNOSIS — M722 Plantar fascial fibromatosis: Secondary | ICD-10-CM | POA: Diagnosis not present

## 2019-01-19 DIAGNOSIS — M5417 Radiculopathy, lumbosacral region: Secondary | ICD-10-CM | POA: Diagnosis not present

## 2019-01-19 DIAGNOSIS — M79671 Pain in right foot: Secondary | ICD-10-CM | POA: Diagnosis not present

## 2019-01-19 DIAGNOSIS — M9903 Segmental and somatic dysfunction of lumbar region: Secondary | ICD-10-CM | POA: Diagnosis not present

## 2019-01-19 DIAGNOSIS — M9906 Segmental and somatic dysfunction of lower extremity: Secondary | ICD-10-CM | POA: Diagnosis not present

## 2019-01-19 DIAGNOSIS — M545 Low back pain: Secondary | ICD-10-CM | POA: Diagnosis not present

## 2019-02-07 DIAGNOSIS — M79671 Pain in right foot: Secondary | ICD-10-CM | POA: Diagnosis not present

## 2019-02-07 DIAGNOSIS — M9902 Segmental and somatic dysfunction of thoracic region: Secondary | ICD-10-CM | POA: Diagnosis not present

## 2019-02-07 DIAGNOSIS — M9901 Segmental and somatic dysfunction of cervical region: Secondary | ICD-10-CM | POA: Diagnosis not present

## 2019-02-07 DIAGNOSIS — M25511 Pain in right shoulder: Secondary | ICD-10-CM | POA: Diagnosis not present

## 2019-02-07 DIAGNOSIS — M722 Plantar fascial fibromatosis: Secondary | ICD-10-CM | POA: Diagnosis not present

## 2019-02-07 DIAGNOSIS — M9904 Segmental and somatic dysfunction of sacral region: Secondary | ICD-10-CM | POA: Diagnosis not present

## 2019-02-14 DIAGNOSIS — M79672 Pain in left foot: Secondary | ICD-10-CM | POA: Diagnosis not present

## 2019-02-14 DIAGNOSIS — M9902 Segmental and somatic dysfunction of thoracic region: Secondary | ICD-10-CM | POA: Diagnosis not present

## 2019-02-14 DIAGNOSIS — M5417 Radiculopathy, lumbosacral region: Secondary | ICD-10-CM | POA: Diagnosis not present

## 2019-02-14 DIAGNOSIS — M25511 Pain in right shoulder: Secondary | ICD-10-CM | POA: Diagnosis not present

## 2019-02-14 DIAGNOSIS — M9901 Segmental and somatic dysfunction of cervical region: Secondary | ICD-10-CM | POA: Diagnosis not present

## 2019-02-14 DIAGNOSIS — M79671 Pain in right foot: Secondary | ICD-10-CM | POA: Diagnosis not present

## 2019-02-14 DIAGNOSIS — M722 Plantar fascial fibromatosis: Secondary | ICD-10-CM | POA: Diagnosis not present

## 2019-03-05 DIAGNOSIS — Z20828 Contact with and (suspected) exposure to other viral communicable diseases: Secondary | ICD-10-CM | POA: Diagnosis not present

## 2019-03-06 DIAGNOSIS — D539 Nutritional anemia, unspecified: Secondary | ICD-10-CM | POA: Diagnosis not present

## 2019-03-06 DIAGNOSIS — E559 Vitamin D deficiency, unspecified: Secondary | ICD-10-CM | POA: Diagnosis not present

## 2019-03-07 DIAGNOSIS — M9904 Segmental and somatic dysfunction of sacral region: Secondary | ICD-10-CM | POA: Diagnosis not present

## 2019-03-07 DIAGNOSIS — M9902 Segmental and somatic dysfunction of thoracic region: Secondary | ICD-10-CM | POA: Diagnosis not present

## 2019-03-07 DIAGNOSIS — M9903 Segmental and somatic dysfunction of lumbar region: Secondary | ICD-10-CM | POA: Diagnosis not present

## 2019-03-07 DIAGNOSIS — M9901 Segmental and somatic dysfunction of cervical region: Secondary | ICD-10-CM | POA: Diagnosis not present

## 2019-03-07 DIAGNOSIS — M545 Low back pain: Secondary | ICD-10-CM | POA: Diagnosis not present

## 2019-03-07 DIAGNOSIS — M62830 Muscle spasm of back: Secondary | ICD-10-CM | POA: Diagnosis not present

## 2019-03-16 DIAGNOSIS — M79671 Pain in right foot: Secondary | ICD-10-CM | POA: Diagnosis not present

## 2019-03-16 DIAGNOSIS — M9901 Segmental and somatic dysfunction of cervical region: Secondary | ICD-10-CM | POA: Diagnosis not present

## 2019-03-16 DIAGNOSIS — M79672 Pain in left foot: Secondary | ICD-10-CM | POA: Diagnosis not present

## 2019-03-16 DIAGNOSIS — M5417 Radiculopathy, lumbosacral region: Secondary | ICD-10-CM | POA: Diagnosis not present

## 2019-03-16 DIAGNOSIS — M9902 Segmental and somatic dysfunction of thoracic region: Secondary | ICD-10-CM | POA: Diagnosis not present

## 2019-03-16 DIAGNOSIS — M722 Plantar fascial fibromatosis: Secondary | ICD-10-CM | POA: Diagnosis not present

## 2019-03-16 DIAGNOSIS — M25511 Pain in right shoulder: Secondary | ICD-10-CM | POA: Diagnosis not present

## 2019-04-13 DIAGNOSIS — M722 Plantar fascial fibromatosis: Secondary | ICD-10-CM | POA: Diagnosis not present

## 2019-04-13 DIAGNOSIS — M79671 Pain in right foot: Secondary | ICD-10-CM | POA: Diagnosis not present

## 2019-04-13 DIAGNOSIS — M5417 Radiculopathy, lumbosacral region: Secondary | ICD-10-CM | POA: Diagnosis not present

## 2019-04-13 DIAGNOSIS — M79672 Pain in left foot: Secondary | ICD-10-CM | POA: Diagnosis not present

## 2019-04-13 DIAGNOSIS — M25511 Pain in right shoulder: Secondary | ICD-10-CM | POA: Diagnosis not present

## 2019-04-13 DIAGNOSIS — M9901 Segmental and somatic dysfunction of cervical region: Secondary | ICD-10-CM | POA: Diagnosis not present

## 2019-04-13 DIAGNOSIS — M9902 Segmental and somatic dysfunction of thoracic region: Secondary | ICD-10-CM | POA: Diagnosis not present

## 2019-04-16 DIAGNOSIS — M79672 Pain in left foot: Secondary | ICD-10-CM | POA: Diagnosis not present

## 2019-04-16 DIAGNOSIS — M79671 Pain in right foot: Secondary | ICD-10-CM | POA: Diagnosis not present

## 2019-04-16 DIAGNOSIS — M722 Plantar fascial fibromatosis: Secondary | ICD-10-CM | POA: Diagnosis not present

## 2019-04-16 DIAGNOSIS — M25511 Pain in right shoulder: Secondary | ICD-10-CM | POA: Diagnosis not present

## 2019-04-30 DIAGNOSIS — H52203 Unspecified astigmatism, bilateral: Secondary | ICD-10-CM | POA: Diagnosis not present

## 2019-04-30 DIAGNOSIS — Z83511 Family history of glaucoma: Secondary | ICD-10-CM | POA: Diagnosis not present

## 2019-04-30 DIAGNOSIS — Z83518 Family history of other specified eye disorder: Secondary | ICD-10-CM | POA: Diagnosis not present

## 2019-05-04 DIAGNOSIS — M545 Low back pain: Secondary | ICD-10-CM | POA: Diagnosis not present

## 2019-05-04 DIAGNOSIS — M9904 Segmental and somatic dysfunction of sacral region: Secondary | ICD-10-CM | POA: Diagnosis not present

## 2019-05-04 DIAGNOSIS — M9907 Segmental and somatic dysfunction of upper extremity: Secondary | ICD-10-CM | POA: Diagnosis not present

## 2019-05-04 DIAGNOSIS — M25511 Pain in right shoulder: Secondary | ICD-10-CM | POA: Diagnosis not present

## 2019-05-04 DIAGNOSIS — M5417 Radiculopathy, lumbosacral region: Secondary | ICD-10-CM | POA: Diagnosis not present

## 2019-05-04 DIAGNOSIS — M9903 Segmental and somatic dysfunction of lumbar region: Secondary | ICD-10-CM | POA: Diagnosis not present

## 2019-05-29 DIAGNOSIS — M9904 Segmental and somatic dysfunction of sacral region: Secondary | ICD-10-CM | POA: Diagnosis not present

## 2019-05-29 DIAGNOSIS — M62830 Muscle spasm of back: Secondary | ICD-10-CM | POA: Diagnosis not present

## 2019-05-29 DIAGNOSIS — M9903 Segmental and somatic dysfunction of lumbar region: Secondary | ICD-10-CM | POA: Diagnosis not present

## 2019-05-29 DIAGNOSIS — M9907 Segmental and somatic dysfunction of upper extremity: Secondary | ICD-10-CM | POA: Diagnosis not present

## 2019-05-29 DIAGNOSIS — M5417 Radiculopathy, lumbosacral region: Secondary | ICD-10-CM | POA: Diagnosis not present

## 2019-05-29 DIAGNOSIS — M545 Low back pain: Secondary | ICD-10-CM | POA: Diagnosis not present

## 2019-05-29 DIAGNOSIS — M9902 Segmental and somatic dysfunction of thoracic region: Secondary | ICD-10-CM | POA: Diagnosis not present

## 2019-06-05 DIAGNOSIS — M9903 Segmental and somatic dysfunction of lumbar region: Secondary | ICD-10-CM | POA: Diagnosis not present

## 2019-06-05 DIAGNOSIS — M9904 Segmental and somatic dysfunction of sacral region: Secondary | ICD-10-CM | POA: Diagnosis not present

## 2019-06-05 DIAGNOSIS — M62830 Muscle spasm of back: Secondary | ICD-10-CM | POA: Diagnosis not present

## 2019-06-05 DIAGNOSIS — M5417 Radiculopathy, lumbosacral region: Secondary | ICD-10-CM | POA: Diagnosis not present

## 2019-06-05 DIAGNOSIS — M9907 Segmental and somatic dysfunction of upper extremity: Secondary | ICD-10-CM | POA: Diagnosis not present

## 2019-06-05 DIAGNOSIS — M545 Low back pain: Secondary | ICD-10-CM | POA: Diagnosis not present

## 2019-06-05 DIAGNOSIS — M9902 Segmental and somatic dysfunction of thoracic region: Secondary | ICD-10-CM | POA: Diagnosis not present

## 2019-06-13 DIAGNOSIS — M62830 Muscle spasm of back: Secondary | ICD-10-CM | POA: Diagnosis not present

## 2019-06-13 DIAGNOSIS — M9907 Segmental and somatic dysfunction of upper extremity: Secondary | ICD-10-CM | POA: Diagnosis not present

## 2019-06-13 DIAGNOSIS — M9903 Segmental and somatic dysfunction of lumbar region: Secondary | ICD-10-CM | POA: Diagnosis not present

## 2019-06-13 DIAGNOSIS — M9904 Segmental and somatic dysfunction of sacral region: Secondary | ICD-10-CM | POA: Diagnosis not present

## 2019-06-13 DIAGNOSIS — M545 Low back pain: Secondary | ICD-10-CM | POA: Diagnosis not present

## 2019-06-13 DIAGNOSIS — M9902 Segmental and somatic dysfunction of thoracic region: Secondary | ICD-10-CM | POA: Diagnosis not present

## 2019-06-13 DIAGNOSIS — M5417 Radiculopathy, lumbosacral region: Secondary | ICD-10-CM | POA: Diagnosis not present

## 2019-06-22 DIAGNOSIS — M5417 Radiculopathy, lumbosacral region: Secondary | ICD-10-CM | POA: Diagnosis not present

## 2019-06-22 DIAGNOSIS — M545 Low back pain: Secondary | ICD-10-CM | POA: Diagnosis not present

## 2019-06-22 DIAGNOSIS — M9904 Segmental and somatic dysfunction of sacral region: Secondary | ICD-10-CM | POA: Diagnosis not present

## 2019-06-22 DIAGNOSIS — M9902 Segmental and somatic dysfunction of thoracic region: Secondary | ICD-10-CM | POA: Diagnosis not present

## 2019-06-22 DIAGNOSIS — M546 Pain in thoracic spine: Secondary | ICD-10-CM | POA: Diagnosis not present

## 2019-06-22 DIAGNOSIS — M9908 Segmental and somatic dysfunction of rib cage: Secondary | ICD-10-CM | POA: Diagnosis not present

## 2019-06-22 DIAGNOSIS — M62830 Muscle spasm of back: Secondary | ICD-10-CM | POA: Diagnosis not present

## 2019-06-22 DIAGNOSIS — M9903 Segmental and somatic dysfunction of lumbar region: Secondary | ICD-10-CM | POA: Diagnosis not present

## 2019-06-26 DIAGNOSIS — M62830 Muscle spasm of back: Secondary | ICD-10-CM | POA: Diagnosis not present

## 2019-06-26 DIAGNOSIS — M9902 Segmental and somatic dysfunction of thoracic region: Secondary | ICD-10-CM | POA: Diagnosis not present

## 2019-06-26 DIAGNOSIS — M9904 Segmental and somatic dysfunction of sacral region: Secondary | ICD-10-CM | POA: Diagnosis not present

## 2019-06-26 DIAGNOSIS — R110 Nausea: Secondary | ICD-10-CM | POA: Insufficient documentation

## 2019-06-26 DIAGNOSIS — M5417 Radiculopathy, lumbosacral region: Secondary | ICD-10-CM | POA: Diagnosis not present

## 2019-06-26 DIAGNOSIS — M9903 Segmental and somatic dysfunction of lumbar region: Secondary | ICD-10-CM | POA: Diagnosis not present

## 2019-06-26 DIAGNOSIS — M9907 Segmental and somatic dysfunction of upper extremity: Secondary | ICD-10-CM | POA: Diagnosis not present

## 2019-06-26 DIAGNOSIS — M545 Low back pain: Secondary | ICD-10-CM | POA: Diagnosis not present

## 2019-06-26 DIAGNOSIS — R112 Nausea with vomiting, unspecified: Secondary | ICD-10-CM | POA: Insufficient documentation

## 2019-06-26 DIAGNOSIS — R1011 Right upper quadrant pain: Secondary | ICD-10-CM | POA: Diagnosis not present

## 2019-06-26 DIAGNOSIS — D508 Other iron deficiency anemias: Secondary | ICD-10-CM | POA: Diagnosis not present

## 2019-06-28 DIAGNOSIS — R1011 Right upper quadrant pain: Secondary | ICD-10-CM | POA: Diagnosis not present

## 2019-06-28 DIAGNOSIS — D508 Other iron deficiency anemias: Secondary | ICD-10-CM | POA: Diagnosis not present

## 2019-06-28 DIAGNOSIS — R110 Nausea: Secondary | ICD-10-CM | POA: Diagnosis not present

## 2019-07-03 DIAGNOSIS — Z8742 Personal history of other diseases of the female genital tract: Secondary | ICD-10-CM | POA: Diagnosis not present

## 2019-07-03 DIAGNOSIS — R110 Nausea: Secondary | ICD-10-CM | POA: Diagnosis not present

## 2019-07-03 DIAGNOSIS — R1011 Right upper quadrant pain: Secondary | ICD-10-CM | POA: Diagnosis not present

## 2019-07-03 DIAGNOSIS — N6310 Unspecified lump in the right breast, unspecified quadrant: Secondary | ICD-10-CM | POA: Diagnosis not present

## 2019-07-09 DIAGNOSIS — R1011 Right upper quadrant pain: Secondary | ICD-10-CM | POA: Diagnosis not present

## 2019-07-20 DIAGNOSIS — Z8742 Personal history of other diseases of the female genital tract: Secondary | ICD-10-CM | POA: Diagnosis not present

## 2019-07-20 DIAGNOSIS — N83201 Unspecified ovarian cyst, right side: Secondary | ICD-10-CM | POA: Diagnosis not present

## 2019-07-20 DIAGNOSIS — R1011 Right upper quadrant pain: Secondary | ICD-10-CM | POA: Diagnosis not present

## 2019-07-20 DIAGNOSIS — K219 Gastro-esophageal reflux disease without esophagitis: Secondary | ICD-10-CM | POA: Diagnosis not present

## 2019-07-23 DIAGNOSIS — R110 Nausea: Secondary | ICD-10-CM | POA: Diagnosis not present

## 2019-07-23 DIAGNOSIS — R1011 Right upper quadrant pain: Secondary | ICD-10-CM | POA: Diagnosis not present

## 2019-07-25 DIAGNOSIS — M5417 Radiculopathy, lumbosacral region: Secondary | ICD-10-CM | POA: Diagnosis not present

## 2019-07-25 DIAGNOSIS — M9904 Segmental and somatic dysfunction of sacral region: Secondary | ICD-10-CM | POA: Diagnosis not present

## 2019-07-25 DIAGNOSIS — M62830 Muscle spasm of back: Secondary | ICD-10-CM | POA: Diagnosis not present

## 2019-07-25 DIAGNOSIS — M9907 Segmental and somatic dysfunction of upper extremity: Secondary | ICD-10-CM | POA: Diagnosis not present

## 2019-07-25 DIAGNOSIS — R0782 Intercostal pain: Secondary | ICD-10-CM | POA: Diagnosis not present

## 2019-07-25 DIAGNOSIS — M9903 Segmental and somatic dysfunction of lumbar region: Secondary | ICD-10-CM | POA: Diagnosis not present

## 2019-07-25 DIAGNOSIS — M545 Low back pain: Secondary | ICD-10-CM | POA: Diagnosis not present

## 2019-08-01 DIAGNOSIS — M62830 Muscle spasm of back: Secondary | ICD-10-CM | POA: Diagnosis not present

## 2019-08-01 DIAGNOSIS — M545 Low back pain: Secondary | ICD-10-CM | POA: Diagnosis not present

## 2019-08-01 DIAGNOSIS — M9907 Segmental and somatic dysfunction of upper extremity: Secondary | ICD-10-CM | POA: Diagnosis not present

## 2019-08-01 DIAGNOSIS — M9903 Segmental and somatic dysfunction of lumbar region: Secondary | ICD-10-CM | POA: Diagnosis not present

## 2019-08-01 DIAGNOSIS — M9904 Segmental and somatic dysfunction of sacral region: Secondary | ICD-10-CM | POA: Diagnosis not present

## 2019-08-01 DIAGNOSIS — R0782 Intercostal pain: Secondary | ICD-10-CM | POA: Diagnosis not present

## 2019-08-01 DIAGNOSIS — M5417 Radiculopathy, lumbosacral region: Secondary | ICD-10-CM | POA: Diagnosis not present

## 2019-08-29 DIAGNOSIS — R0782 Intercostal pain: Secondary | ICD-10-CM | POA: Diagnosis not present

## 2019-08-29 DIAGNOSIS — M545 Low back pain: Secondary | ICD-10-CM | POA: Diagnosis not present

## 2019-08-29 DIAGNOSIS — M79671 Pain in right foot: Secondary | ICD-10-CM | POA: Diagnosis not present

## 2019-08-29 DIAGNOSIS — M722 Plantar fascial fibromatosis: Secondary | ICD-10-CM | POA: Diagnosis not present

## 2019-08-29 DIAGNOSIS — M62830 Muscle spasm of back: Secondary | ICD-10-CM | POA: Diagnosis not present

## 2019-08-29 DIAGNOSIS — M9907 Segmental and somatic dysfunction of upper extremity: Secondary | ICD-10-CM | POA: Diagnosis not present

## 2019-08-29 DIAGNOSIS — M9904 Segmental and somatic dysfunction of sacral region: Secondary | ICD-10-CM | POA: Diagnosis not present

## 2019-08-29 DIAGNOSIS — M9903 Segmental and somatic dysfunction of lumbar region: Secondary | ICD-10-CM | POA: Diagnosis not present

## 2019-08-29 DIAGNOSIS — M5417 Radiculopathy, lumbosacral region: Secondary | ICD-10-CM | POA: Diagnosis not present

## 2019-09-12 DIAGNOSIS — M545 Low back pain: Secondary | ICD-10-CM | POA: Diagnosis not present

## 2019-09-12 DIAGNOSIS — M9907 Segmental and somatic dysfunction of upper extremity: Secondary | ICD-10-CM | POA: Diagnosis not present

## 2019-09-12 DIAGNOSIS — M722 Plantar fascial fibromatosis: Secondary | ICD-10-CM | POA: Diagnosis not present

## 2019-09-12 DIAGNOSIS — R0782 Intercostal pain: Secondary | ICD-10-CM | POA: Diagnosis not present

## 2019-09-12 DIAGNOSIS — M9903 Segmental and somatic dysfunction of lumbar region: Secondary | ICD-10-CM | POA: Diagnosis not present

## 2019-09-12 DIAGNOSIS — M62830 Muscle spasm of back: Secondary | ICD-10-CM | POA: Diagnosis not present

## 2019-09-12 DIAGNOSIS — M9904 Segmental and somatic dysfunction of sacral region: Secondary | ICD-10-CM | POA: Diagnosis not present

## 2019-09-12 DIAGNOSIS — M5417 Radiculopathy, lumbosacral region: Secondary | ICD-10-CM | POA: Diagnosis not present

## 2019-09-12 DIAGNOSIS — M79671 Pain in right foot: Secondary | ICD-10-CM | POA: Diagnosis not present

## 2019-09-18 DIAGNOSIS — M9908 Segmental and somatic dysfunction of rib cage: Secondary | ICD-10-CM | POA: Diagnosis not present

## 2019-09-18 DIAGNOSIS — M9904 Segmental and somatic dysfunction of sacral region: Secondary | ICD-10-CM | POA: Diagnosis not present

## 2019-09-18 DIAGNOSIS — M9903 Segmental and somatic dysfunction of lumbar region: Secondary | ICD-10-CM | POA: Diagnosis not present

## 2019-09-18 DIAGNOSIS — M5417 Radiculopathy, lumbosacral region: Secondary | ICD-10-CM | POA: Diagnosis not present

## 2019-09-18 DIAGNOSIS — M62830 Muscle spasm of back: Secondary | ICD-10-CM | POA: Diagnosis not present

## 2019-09-18 DIAGNOSIS — M545 Low back pain: Secondary | ICD-10-CM | POA: Diagnosis not present

## 2019-09-18 DIAGNOSIS — R0782 Intercostal pain: Secondary | ICD-10-CM | POA: Diagnosis not present

## 2019-10-12 ENCOUNTER — Other Ambulatory Visit: Payer: Self-pay

## 2019-10-12 ENCOUNTER — Encounter: Payer: Self-pay | Admitting: Adult Health

## 2019-10-12 ENCOUNTER — Ambulatory Visit (INDEPENDENT_AMBULATORY_CARE_PROVIDER_SITE_OTHER): Payer: BC Managed Care – PPO | Admitting: Adult Health

## 2019-10-12 DIAGNOSIS — F419 Anxiety disorder, unspecified: Secondary | ICD-10-CM

## 2019-10-12 DIAGNOSIS — R1011 Right upper quadrant pain: Secondary | ICD-10-CM

## 2019-10-12 DIAGNOSIS — Z862 Personal history of diseases of the blood and blood-forming organs and certain disorders involving the immune mechanism: Secondary | ICD-10-CM

## 2019-10-12 DIAGNOSIS — Z8249 Family history of ischemic heart disease and other diseases of the circulatory system: Secondary | ICD-10-CM

## 2019-10-12 DIAGNOSIS — N6310 Unspecified lump in the right breast, unspecified quadrant: Secondary | ICD-10-CM

## 2019-10-12 DIAGNOSIS — Z1329 Encounter for screening for other suspected endocrine disorder: Secondary | ICD-10-CM

## 2019-10-12 DIAGNOSIS — R0782 Intercostal pain: Secondary | ICD-10-CM

## 2019-10-12 DIAGNOSIS — R140 Abdominal distension (gaseous): Secondary | ICD-10-CM

## 2019-10-12 DIAGNOSIS — R1013 Epigastric pain: Secondary | ICD-10-CM

## 2019-10-12 DIAGNOSIS — R0789 Other chest pain: Secondary | ICD-10-CM | POA: Diagnosis not present

## 2019-10-12 DIAGNOSIS — Z1389 Encounter for screening for other disorder: Secondary | ICD-10-CM | POA: Insufficient documentation

## 2019-10-12 DIAGNOSIS — R110 Nausea: Secondary | ICD-10-CM

## 2019-10-12 DIAGNOSIS — D473 Essential (hemorrhagic) thrombocythemia: Secondary | ICD-10-CM

## 2019-10-12 DIAGNOSIS — Z13220 Encounter for screening for lipoid disorders: Secondary | ICD-10-CM

## 2019-10-12 DIAGNOSIS — R0602 Shortness of breath: Secondary | ICD-10-CM

## 2019-10-12 DIAGNOSIS — E678 Other specified hyperalimentation: Secondary | ICD-10-CM | POA: Insufficient documentation

## 2019-10-12 DIAGNOSIS — R935 Abnormal findings on diagnostic imaging of other abdominal regions, including retroperitoneum: Secondary | ICD-10-CM

## 2019-10-12 DIAGNOSIS — E559 Vitamin D deficiency, unspecified: Secondary | ICD-10-CM

## 2019-10-12 DIAGNOSIS — K219 Gastro-esophageal reflux disease without esophagitis: Secondary | ICD-10-CM

## 2019-10-12 DIAGNOSIS — R062 Wheezing: Secondary | ICD-10-CM

## 2019-10-12 DIAGNOSIS — D75839 Thrombocytosis, unspecified: Secondary | ICD-10-CM

## 2019-10-12 DIAGNOSIS — R112 Nausea with vomiting, unspecified: Secondary | ICD-10-CM

## 2019-10-12 DIAGNOSIS — G4700 Insomnia, unspecified: Secondary | ICD-10-CM | POA: Insufficient documentation

## 2019-10-12 DIAGNOSIS — Z77120 Contact with and (suspected) exposure to mold (toxic): Secondary | ICD-10-CM

## 2019-10-12 MED ORDER — ALPRAZOLAM 0.5 MG PO TABS: ORAL_TABLET | ORAL | 0 refills | Status: DC

## 2019-10-12 NOTE — Patient Instructions (Signed)
Breast Self-Awareness Breast self-awareness is knowing how your breasts look and feel. Doing breast self-awareness is important. It allows you to catch a breast problem early while it is still small and can be treated. All women should do breast self-awareness, including women who have had breast implants. Tell your doctor if you notice a change in your breasts. What you need:  A mirror.  A well-lit room. How to do a breast self-exam A breast self-exam is one way to learn what is normal for your breasts and to check for changes. To do a breast self-exam: Look for changes  1. Take off all the clothes above your waist. 2. Stand in front of a mirror in a room with good lighting. 3. Put your hands on your hips. 4. Push your hands down. 5. Look at your breasts and nipples in the mirror to see if one breast or nipple looks different from the other. Check to see if: ? The shape of one breast is different. ? The size of one breast is different. ? There are wrinkles, dips, and bumps in one breast and not the other. 6. Look at each breast for changes in the skin, such as: ? Redness. ? Scaly areas. 7. Look for changes in your nipples, such as: ? Liquid around the nipples. ? Bleeding. ? Dimpling. ? Redness. ? A change in where the nipples are. Feel for changes  1. Lie on your back on the floor. 2. Feel each breast. To do this, follow these steps: ? Pick a breast to feel. ? Put the arm closest to that breast above your head. ? Use your other arm to feel the nipple area of your breast. Feel the area with the pads of your three middle fingers by making small circles with your fingers. For the first circle, press lightly. For the second circle, press harder. For the third circle, press even harder. ? Keep making circles with your fingers at the different pressures as you move down your breast. Stop when you feel your ribs. ? Move your fingers a little toward the center of your body. ? Start  making circles with your fingers again, this time going up until you reach your collarbone. ? Keep making up-and-down circles until you reach your armpit. Remember to keep using the three pressures. ? Feel the other breast in the same way. 3. Sit or stand in the tub or shower. 4. With soapy water on your skin, feel each breast the same way you did in step 2 when you were lying on the floor. Write down what you find Writing down what you find can help you remember what to tell your doctor. Write down:  What is normal for each breast.  Any changes you find in each breast, including: ? The kind of changes you find. ? Whether you have pain. ? Size and location of any lumps.  When you last had your menstrual period. General tips  Check your breasts every month.  If you are breastfeeding, the best time to check your breasts is after you feed your baby or after you use a breast pump.  If you get menstrual periods, the best time to check your breasts is 5-7 days after your menstrual period is over.  With time, you will become comfortable with the self-exam, and you will begin to know if there are changes in your breasts. Contact a doctor if you:  See a change in the shape or size of your breasts or  nipples.  See a change in the skin of your breast or nipples, such as red or scaly skin.  Have fluid coming from your nipples that is not normal.  Find a lump or thick area that was not there before.  Have pain in your breasts.  Have any concerns about your breast health. Summary  Breast self-awareness includes looking for changes in your breasts, as well as feeling for changes within your breasts.  Breast self-awareness should be done in front of a mirror in a well-lit room.  You should check your breasts every month. If you get menstrual periods, the best time to check your breasts is 5-7 days after your menstrual period is over.  Let your doctor know of any changes you see in your  breasts, including changes in size, changes on the skin, pain or tenderness, or fluid from your nipples that is not normal. This information is not intended to replace advice given to you by your health care provider. Make sure you discuss any questions you have with your health care provider. Document Revised: 01/17/2018 Document Reviewed: 01/17/2018 Elsevier Patient Education  [place] is a feeling of pain, discomfort, burning, or fullness in the upper part of your belly (abdomen). It can come and go. It may occur often or rarely. Indigestion tends to happen while you are eating or right after you have finished eating. Indigestion may be a symptom of another condition. It may be worse:  At night.  When bending over.  While lying down. Follow these instructions at home: Eating and drinking   Follow an eating plan as told by your doctor.  You may need to avoid foods and drinks such as: ? Chocolate and cocoa. ? Peppermint and mint flavorings. ? Garlic and onions. ? Horseradish. ? Spicy and acidic foods, such as:  Peppers.  Chili powder and curry powder.  Vinegar.  Hot sauces and BBQ sauce. ? Citrus fruits, such as:  Oranges.  Lemons.  Limes. ? Tomato-based foods, such as:  Red sauce and pizza with red sauce.  Chili.  Salsa. ? Fried and fatty foods, such as:  Donuts.  [place] fries and potato chips.  High-fat dressings. ? High-fat meats, such as:  Hot dogs and sausage.  Rib eye steak.  Ham and bacon. ? High-fat dairy items, such as:  Whole milk.  Butter.  Cream cheese. ? Coffee and tea (with or without caffeine). ? Drinks that contain alcohol. ? Energy drinks and sports drinks. ? Carbonated drinks or sodas. ? Citrus fruit juices.  Eat small meals often. Avoid eating large meals.  Avoid drinking large amounts of liquid with your meals.  Avoid eating meals during the 2-3 hours before bedtime.  Avoid lying  down right after you eat.  Avoid exercise for 2 hours after you eat. Lifestyle      Maintain a healthy weight. Ask your doctor what weight is healthy for you. If you need to lose weight, work with your doctor.  Exercise for at least 30 minutes on 5 or more days each week, or as told by your doctor. ? Avoid exercises that include bending forward. This can make your symptoms worse.  Wear loose clothes. Do not wear anything tight around your waist.  Do not use any products that contain nicotine or tobacco, including cigarettes, e-cigarettes, and chewing tobacco. These can make your symptoms worse. If you need help quitting, ask your doctor.  Raise (elevate) the head of your bed about 6  inches (15 cm) when you sleep.  Try to lower your stress. If you need help doing this, ask your doctor. General instructions  Take over-the-counter and prescription medicines only as told by your doctor. ? Do not take aspirin, ibuprofen, or other NSAIDs unless your doctor says it is okay.  Pay attention to any changes in your symptoms.  Keep all follow-up visits as told by your doctor. This is important. Contact a doctor if:  You have new symptoms.  You lose weight and you do not know why it is happening.  You have trouble swallowing, or it hurts to swallow.  Your symptoms do not get better with treatment.  Your symptoms last for more than 2 days.  You have a fever.  You throw up (vomit). Get help right away if:  You have pain in your arms, neck, jaw, teeth, or back.  You feel sweaty, dizzy, or light-headed.  You pass out (faint).  You have chest pain or shortness of breath.  You cannot stop throwing up, or you throw up blood.  Your poop (stool) is bloody or black.  You have very bad pain in your belly. These symptoms may represent a serious problem that is an emergency. Do not wait to see if the symptoms will go away. Get medical help right away. Call your local emergency  services (911 in the [place] Do not drive yourself to the hospital. Summary  Indigestion is a feeling of pain, discomfort, burning, or fullness in the upper part of your belly. It tends to happen while you are eating or right after you have finished eating.  Follow an eating plan and other lifestyle changes as told by your doctor.  Take over-the-counter and prescription medicines only as told by your doctor. Do not take aspirin, ibuprofen, or other NSAIDs unless your doctor says it is okay.  Contact your doctor if your symptoms do not get better or they get worse.  Some symptoms may represent a serious problem that is an emergency. Do not wait to see if the symptoms will go away. Get medical help right away. This information is not intended to replace advice given to you by your health care provider. Make sure you discuss any questions you have with your health care provider. Document Revised: 10/31/2017 Document Reviewed: 10/31/2017 Elsevier Patient Education  [place]. Abdominal Pain, Adult Many things can cause belly (abdominal) pain. Most times, belly pain is not dangerous. Many cases of belly pain can be watched and treated at home. Sometimes, though, belly pain is serious. Your doctor will try to find the cause of your belly pain. Follow these instructions at home:  Medicines  Take over-the-counter and prescription medicines only as told by your doctor.  Do not take medicines that help you poop (laxatives) unless told by your doctor. General instructions  Watch your belly pain for any changes.  Drink enough fluid to keep your pee (urine) pale yellow.  Keep all follow-up visits as told by your doctor. This is important. Contact a doctor if:  Your belly pain changes or gets worse.  You are not hungry, or you lose weight without trying.  You are having trouble pooping (constipated) or have watery poop (diarrhea) for more than 2-3 days.  You have pain when you pee or  poop.  Your belly pain wakes you up at night.  Your pain gets worse with meals, after eating, or with certain foods.  You are vomiting and cannot keep anything down.  You have a fever.  You have blood in your pee. Get help right away if:  Your pain does not go away as soon as your doctor says it should.  You cannot stop vomiting.  Your pain is only in areas of your belly, such as the right side or the left lower part of the belly.  You have bloody or black poop, or poop that looks like tar.  You have very bad pain, cramping, or bloating in your belly.  You have signs of not having enough fluid or water in your body (dehydration), such as: ? Dark pee, very little pee, or no pee. ? Cracked lips. ? Dry mouth. ? Sunken eyes. ? Sleepiness. ? Weakness.  You have trouble breathing or chest pain. Summary  Many cases of belly pain can be watched and treated at home.  Watch your belly pain for any changes.  Take over-the-counter and prescription medicines only as told by your doctor.  Contact a doctor if your belly pain changes or gets worse.  Get help right away if you have very bad pain, cramping, or bloating in your belly. This information is not intended to replace advice given to you by your health care provider. Make sure you discuss any questions you have with your health care provider. Document Revised: 10/09/2018 Document Reviewed: 10/09/2018 Elsevier Patient Education  [place].

## 2019-10-12 NOTE — Progress Notes (Addendum)
Virtual telephone visit    Virtual Visit via Telephone Note   This visit type was conducted due to national recommendations for restrictions regarding the COVID-19 Pandemic (e.g. social distancing) in an effort to limit this patient's exposure and mitigate transmission in our community. Due to her co-morbid illnesses, this patient is at least at moderate risk for complications without adequate follow up. This format is felt to be most appropriate for this patient at this time. The patient did not have access to video technology or had technical difficulties with video requiring transitioning to audio format only (telephone). Physical exam was limited to content and character of the telephone converstion.    Patient location: in her residence.  Provider location: Provider: Provider's office at  [facility], [place].      Patient: [name]   DOB: Nov 16, 1985   34 y.o. Female  MRN: [id number] Visit Date: 10/12/2019  Today's healthcare provider: Marcille Buffy, FNP   No chief complaint on file.  Subjective    HPI Patient presents today as a new patient to establish care, patient states that she feels fairly well today but would like to discuss changes to her breast.   Patient reports in December she noticed a "knot" on her right breast that she describes as hard and moving. She reports it may have slightly increased in size.  Denies any imaging or screening. No previous masses/ biopsies.  She has been refereed for imaging by her previous PCP and has not heard anything regarding going.  Denies any nipple discharge or dimpling.  She denies  any lymphadenopathy in axillary area.   Last labs were 12.02/2018.   She takes Xanax occasionally for sleep. Needs refill.Last refilled  She requested 90-day refill, this with her insurance she reports is usually cheaper.  PMP aware was checked and last refill was as below.  He is aware of side effects. 06/11/2019  Lorazepam 0.5 MG Tablet 90.00 45 She Bea Walgre 0 45.00    Patient has tried Bentyl without any relief of her right upper quadrant pain.  She is unsure if she has been tested for H. pylori in the past.. Patient has pain in right upper quadrant sometimes into the right upper chest wall.  Under the rib cage she reports pain. She has been given Protonix 40 mg po BID reports this may help some with her GERD.  She still has dyspepsia.  Has been seen by gastroenterologist at [facility]. She has had HIDA scan in past x 2 and one was [place].  Gallbladder surgery was not performed.  Chest x-ray was in 2014 formed at [facility] and was within normal limits, she has a history of wheezing, she thinks that possibly mold exposure in the past has triggered that.  She continues to have wheezing sometimes at night when she lies down.  Denies any orthopnea.  No extensive edema.She does have  Chest wall pain she has had for " years". Feels short of breath at times.  Last chest x ray was within normal limits.  She denies any muscle pain or palpitation.   She has had H Pylori testing she believes in the past however I will review/review chart and do not see any H. pylori testing will order lab.  Provider reviewed 07/20/2019 ultrasound of the right upper quadrant that was performed at [facility].  Did have MRI in April 03, 2018 of the liver.  She also had a CT of abdomen  and pelvis May 16, 2018. FINDINGS: Liver: Normal echogenicity. There is an anechoic mass in the left lobe with posterior through transmission measuring 1.3 x 1.1 x 0.9 cm, likely the cyst seen on prior MRI. 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 Bile Ducts: No intrahepatic or extrahepatic ductal dilation. Common bile duct diameter: 5 mm. Pancreas: Not well evaluated due to overlying bowel gas. IMPRESSION: 1.  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 2. Small 1.3 cm anechoic mass in the left lobe, probable cyst seen on prior MRI in the same location. 3. No cholelithiasis or acute cholecystitis.  Electronically Signed by: Caprice Kluver, MD, [facility] Electronically Signed on: 07/20/2019 12:50 PM  No LMP recorded. last week was normal.  Denies any chance of pregnancy.  She denies dizziness lightheadedness or presyncopal syncopal episodes. Patient  denies any fever, body aches,chills, rash, denies any active chest pain, shortness of breath, nausea, vomiting, or diarrhea.        Patient Active Problem List   Diagnosis Date Noted  . Mass of breast, right- noted since December 2019  10/12/2019  . Intercostal pain 10/12/2019  . Screening cholesterol level 10/12/2019  . Insomnia 10/12/2019  . Abdominal distension (gaseous) 10/12/2019  . History of iron deficiency anemia 10/12/2019  . Excessive vitamin B12 intake 10/12/2019  . Nausea and vomiting 06/26/2019  . Allergy to bee sting 12/19/2017  . Thrombocytosis ([place]) 12/19/2017  . Family history of aortic aneurysm 03/28/2017  . Anxiety 07/07/2016  . Cubital tunnel syndrome on right 06/24/2015  . De Quervain's disease (radial styloid tenosynovitis) 06/24/2015  . Disorder of left external ear 06/06/2015  . Nutritional anemia 01/06/2015  . Vitamin D deficiency 01/06/2015  . Pain in left ankle and joints of left foot 01/06/2015  . 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 Dyspepsia 09/17/2014  . Epigastric pain 09/17/2014  . Tarsal tunnel syndrome of right side 07/30/2014  . Abdominal pain 12/05/2012     Past Surgical History:  Procedure Laterality Date  . OVARY SURGERY    . URETHRA SURGERY    . WISDOM TOOTH EXTRACTION      Current Outpatient Medications:  .  ALPRAZolam (XANAX) 0.5 MG tablet, Take 0.5 to 1 tab po qhs prn for insomnia, Disp: 90 tablet, Rfl: 0 .  EPINEPHrine 0.3 mg/0.3 mL IJ SOAJ injection, Inject  into the muscle., Disp: , Rfl:  .  hydrochlorothiazide (HYDRODIURIL) 25 MG tablet, TAKE 1/2 TO 1 TABLET BY MOUTH DAILY AS NEEDED FOR FLUID RETENTION, Disp: , Rfl:  .  methocarbamol (ROBAXIN) 500 MG tablet, TAKE 1 TABLET(500 MG) BY MOUTH THREE TIMES DAILY, Disp: , Rfl:  .  montelukast (SINGULAIR) 10 MG tablet, TAKE 1 TABLET(10 MG) BY MOUTH DAILY, Disp: , Rfl:  .  SUMAtriptan (IMITREX) 50 MG tablet, TAKE 1 TABLET BY MOUTH AS NEEDED AT ONSET OF SYMPTOMS. MAY REPEAT IN 2 HOURS IF NEEDED. NO MORE THAN 3 DOSES IN 24 HOURS, Disp: , Rfl:  .  albuterol (VENTOLIN HFA) 108 (90 Base) MCG/ACT inhaler, SMARTSIG:2 Puff(s) By Mouth Every 6 Hours PRN, Disp: , Rfl:  .  ascorbic acid (VITAMIN C) 500 MG tablet, Take by mouth., Disp: , Rfl:  .  buPROPion (WELLBUTRIN XL) 300 MG 24 hr tablet, Take 300 mg by mouth daily., Disp: , Rfl:  .  Cholecalciferol 125 MCG (5000 UT)  capsule, Take by mouth., Disp: , Rfl:  .  cyanocobalamin 1000 MCG tablet, Take by mouth., Disp: , Rfl:  .  FLUoxetine (PROZAC) 20 MG capsule, Take 20 mg by mouth daily., Disp: , Rfl:  .  ketorolac (TORADOL) 10 MG tablet, , Disp: , Rfl:  .  ondansetron (ZOFRAN-ODT) 8 MG disintegrating tablet, Take 8 mg by mouth., Disp: , Rfl:  .  pantoprazole (PROTONIX) 40 MG tablet, Take 40 mg by mouth 2 (two) times daily., Disp: , Rfl:  .  SYMBICORT 80-4.5 MCG/ACT inhaler, Inhale 2 puffs into the lungs 2 (two) times daily., Disp: , Rfl:     Review of Systems  Constitutional: Positive for fatigue. Negative for activity change, appetite change, chills, diaphoresis, fever and unexpected weight change.  Respiratory: Positive for wheezing (chronic has inhalers - triggered possibly by mold exposure she reports. ). Negative for apnea, cough, choking, chest tightness, shortness of breath and stridor.   Cardiovascular: Positive for chest pain (chest wall right side ). Negative for palpitations and leg swelling.  Gastrointestinal: Positive for abdominal distention, abdominal  pain and nausea. Negative for anal bleeding and blood in stool.       Denies blood or dark color stools   Endocrine: Negative for polydipsia, polyphagia and polyuria.  Genitourinary: Negative for difficulty urinating, dysuria, flank pain, frequency and genital sores.  Musculoskeletal: Positive for arthralgias.  Skin: Negative for rash.       Right breast lump present since 2019 December   Allergic/Immunologic: Positive for environmental allergies. Negative for food allergies.  Neurological: Positive for headaches.  Psychiatric/Behavioral: Positive for sleep disturbance. Negative for agitation, behavioral problems, confusion, decreased concentration, dysphoric mood, hallucinations, self-injury and suicidal ideas. The patient is not hyperactive.     Last CBC Lab Results  Component Value Date   WBC 10.3 09/26/2010   HGB 12.6 09/26/2010   HCT 39.1 09/26/2010   MCV 85.7 09/26/2010   MCH 27.6 09/26/2010   RDW 13.0 09/26/2010   PLT 374 26/20/3559   Last metabolic panel No results found for: GLUCOSE, NA, K, CL, CO2, BUN, CREATININE, GFRNONAA, GFRAA, CALCIUM, PHOS, PROT, ALBUMIN, LABGLOB, AGRATIO, BILITOT, ALKPHOS, AST, ALT, ANIONGAP Last lipids No results found for: CHOL, HDL, LDLCALC, LDLDIRECT, TRIG, CHOLHDL Last hemoglobin A1c No results found for: HGBA1C Last thyroid functions No results found for: TSH, T3TOTAL, T4TOTAL, THYROIDAB Last vitamin D No results found for: 25OHVITD2, 25OHVITD3, VD25OH Last vitamin B12 and Folate No results found for: VITAMINB12, FOLATE    Objective    There were no vitals taken for this visit. BP Readings from Last 3 Encounters:  05/16/12 115/79    No vital signs.  This was an audio visit only, no video capabilities on patient side.   Patient is alert and oriented and responsive to questions Engages in conversation with provider. Speaks in full sentences without any pauses without any shortness of breath or distress.   Patient chose video visit  due to COVID-19 pandemic.    Assessment & Plan     Right upper quadrant abdominal pain - Plan: CT Abdomen Pelvis Wo Contrast, Amylase, Lipase, Hepatitis panel, acute, Sed Rate (ESR), CRP High sensitivity, H Pylori, IGM, IGG, IGA AB, H. pylori breath test, PT and PTT, US BREAST LTD UNI RIGHT INC AXILLA  Mass of breast, right- noted since December 2019  - Plan: CT Chest Wo Contrast, US BREAST LTD UNI RIGHT INC AXILLA, CANCELED: US BREAST COMPLETE UNI RIGHT INC AXILLA  Right-sided chest wall pain - Plan: CT  Chest Wo Contrast, H Pylori, IGM, IGG, IGA AB, H. pylori breath test  Dyspepsia - Plan: CT Chest Wo Contrast, CBC with Differential/Platelet  Screening cholesterol level  Vitamin D deficiency - Plan: VITAMIN D 25 Hydroxy (Vit-D Deficiency, Fractures)  Family history of aortic aneurysm - Plan: CT Abdomen Pelvis Wo Contrast, CT Chest Wo Contrast  Chronic nausea - Plan: CT Chest Wo Contrast, Comprehensive Metabolic Panel (CMET), CBC with Differential/Platelet  Anxiety - Plan: DISCONTINUED: ALPRAZolam (XANAX) 0.5 MG tablet  Insomnia, unspecified type - Plan: DISCONTINUED: ALPRAZolam (XANAX) 0.5 MG tablet  Abdominal distension (gaseous) - Plan: CT Abdomen Pelvis Wo Contrast  Nausea and vomiting, intractability of vomiting not specified, unspecified vomiting type - Plan: CT Abdomen Pelvis Wo Contrast, CBC with Differential/Platelet  Intercostal pain - Plan: CT Chest Wo Contrast  History of iron deficiency anemia - Plan: Iron and TIBC, B12 and Folate Panel  Excessive vitamin B12 intake  Abnormal CT of the abdomen  Abnormal CT scan, pelvis - Plan: CT Abdomen Pelvis Wo Contrast  Thyroid disorder screening - Plan: TSH  Gastroesophageal reflux disease, unspecified whether esophagitis present - Plan: H Pylori, IGM, IGG, IGA AB, H. pylori breath test  Thrombocytosis (HCC), Chronic  Wheezing  Exposure to mold  Shortness of breath    Meds ordered this encounter  Medications    . ALPRAZolam (XANAX) 0.5 MG tablet    Sig: Take 0.5 to 1 tab po qhs prn for insomnia    Dispense:  90 tablet    Refill:  0   Orders Placed This Encounter  Procedures  . CT Abdomen Pelvis Wo Contrast    Standing Status:   Future    Standing Expiration Date:   10/23/2019    Scheduling Instructions:     Would like scheduled within 1 - 2 weeks please.    Order Specific Question:   ** REASON FOR EXAM (FREE TEXT)    Answer:   RUQ pain - abdominal bloating, dyspesia- pain in abdomen, history of liver lesioin 2014. Nausea    Order Specific Question:   Is patient pregnant?    Answer:   No    Comments:   lmp 10/07/2019    Order Specific Question:   Preferred imaging location?    Answer:   [facility]    Order Specific Question:   Is Oral Contrast requested for this exam?    Answer:   Yes, Per Radiology protocol    Order Specific Question:   Call Results- Best Contact Number?    Answer:   [phone]    Order Specific Question:   Radiology Contrast Protocol - do NOT remove file path    Answer:   \\[email or web address]\Radiant\CTProtocols.pdf  . CT Chest Wo Contrast    Standing Status:   Future    Standing Expiration Date:   10/22/2020    Scheduling Instructions:     Would like scheduled within 2 weeks.    Order Specific Question:   ** REASON FOR EXAM (FREE TEXT)    Answer:   normal chest x ray 2014, history wheezing and shortness of breath, mold exposure reported.    Order Specific Question:   Is patient pregnant?    Answer:   No    Comments:   lmp 10/07/2019    Order Specific Question:   Preferred imaging location?    Answer:   [facility]    Order Specific Question:   Call Results- Best Contact Number?    Answer:   [phone]  Order Specific Question:   Radiology Contrast Protocol - do NOT remove file path    Answer:   \\[email or web address]\CTProtocols.pdf  . US BREAST COMPLETE UNI RIGHT INC AXILLA    Standing Status:   Future    Standing Expiration Date:    10/24/2020    Scheduling Instructions:     Patient declines mammogram until age 48. She wants to proceed with ultrasound. Please schedule.      She would like to go to [address]      [place] [address]       [phone]    Order Specific Question:   Reason for Exam (SYMPTOM  OR DIAGNOSIS REQUIRED)    Answer:   small breast cyst/mass approximately 10' oclock right breast since december 2019, declined mammogram, may be increasing in size. needs ultrasound. no previous imaging or breast history.    Order Specific Question:   Preferred imaging location?    Answer:   External    Comments:   [facility]     Order Specific Question:   Call Results- Best Contact Number?    Answer:   [phone] fax to office [phone]  . Comprehensive Metabolic Panel (CMET)  . TSH  . CBC with Differential/Platelet  . Iron and TIBC  . Amylase  . Lipase  . Hepatitis panel, acute  . Sed Rate (ESR)  . CRP High sensitivity  . H Pylori, IGM, IGG, IGA AB  . H. pylori breath test  . PT and PTT  . VITAMIN D 25 Hydroxy (Vit-D Deficiency, Fractures)  . B12 and Folate Panel   She does not want to have a mammogram done, patient is aware of protocols for mammograms, however she prefers to only have a ultrasound done at this time of her right breast and axillary.  Declines mammogram at this time.  Given history of abnormal scans in the past will scan her chest and abdomen pelvis and patient has had persistent symptoms of right upper quadrant pain and swelling as well as dyspepsia that has not resolved.  Pancreas is not well evaluated due to over lying bowel gas at the time.  History of liver lesions on previous reports.  She has a history of wheezing to will rule out any chest etiology.  Last chest x-ray was 2014 and was normal.  She does have inhalers for wheezing.  Should this worsen at any time she will follow-up with the office.  Need to consider a pulmonary consult after  reviewing further work-up and imaging with labs.  Consult with GI if needed. Low likelihood of suspicion for clot given her long-term history of the wheezing and occasional shortness of breath however imaging is needed since all other testing has been negative and symptoms are persisitent. Aorta should be viewed by CT.   Inhalers do not seem to help with the wheezing.  She feels this is from a previous exposure to mold.  I do feel that a CT of her chest would be necessary to rule out any other etiology given that her chest x-ray has been normal in the past.  She will go to [place] to have her labs drawn.  I discussed the assessment and treatment plan with the patient. The patient was provided an opportunity to ask questions and all were answered. The patient agreed with the plan and demonstrated an understanding of the instructions.   The patient was advised to call back or seek  an in-person evaluation if the symptoms worsen or if the condition fails to improve as anticipated.  Advised patient call the office or your primary care doctor for an appointment if no improvement within 72 hours or if any symptoms change or worsen at any time  Advised ER or urgent Care if after hours or on weekend. Call 911 for emergency symptoms at any time.Patinet verbalized understanding of all instructions given/reviewed and treatment plan and has no further questions or concerns at this time.    Please schedule a follow up appointment in 1 month.  I provided 35 minutes of non-face-to-face time during this encounter.Addressed acute and or chronic medical problems today requiring 35  minutes reviewing patients medical record,labs, counseling patient regarding patient's conditions, any medications, answering questions regarding health, and coordination of care as needed. After visit summary patient given copy and reviewed.   IWellington Hampshire Kemper Heupel, FNP, have reviewed all documentation for this visit. The documentation  on 10/12/19 for the exam, diagnosis, procedures, and orders are all accurate and complete.  Marcille Buffy, [place] [phone] (phone) [phone] (fax)  [place]

## 2019-10-15 ENCOUNTER — Telehealth: Payer: Self-pay | Admitting: Adult Health

## 2019-10-15 NOTE — Telephone Encounter (Signed)
Insurance company will need to speak peer to peer to get auth for CT of Chest and Ct of ABD/PELVIS.Phone [phone]. Member # [id number]

## 2019-10-17 ENCOUNTER — Encounter: Payer: Self-pay | Admitting: Adult Health

## 2019-10-17 ENCOUNTER — Other Ambulatory Visit: Payer: Self-pay | Admitting: Adult Health

## 2019-10-17 MED ORDER — LORAZEPAM 0.5 MG PO TABS: 0.5000 mg | ORAL_TABLET | Freq: Two times a day (BID) | ORAL | 0 refills | Status: DC | PRN

## 2019-10-17 NOTE — Progress Notes (Signed)
Meds ordered this encounter  Medications  . LORazepam (ATIVAN) 0.5 MG tablet    Sig: Take 1 tablet (0.5 mg total) by mouth 2 (two) times daily as needed for anxiety.    Dispense:  90 tablet    Refill:  0   See note by Jennings Books CMA, previous script for Xanax was not picked up and canceled with pharmacy. She says she was on Ativan and no longer taking Xanax. PMP aware verified as well.

## 2019-10-17 NOTE — Addendum Note (Signed)
Addended by: Doreen Beam on: 10/17/2019 08:37 AM   Modules accepted: Orders

## 2019-10-18 ENCOUNTER — Encounter: Payer: Self-pay | Admitting: Adult Health

## 2019-10-18 DIAGNOSIS — R062 Wheezing: Secondary | ICD-10-CM | POA: Insufficient documentation

## 2019-10-18 DIAGNOSIS — Z77120 Contact with and (suspected) exposure to mold (toxic): Secondary | ICD-10-CM | POA: Insufficient documentation

## 2019-10-18 NOTE — Telephone Encounter (Signed)
Judson Roch  I spoke to insurance company this morning 10/18/2019 CT abdomen and pelvis were approved approval number [id number] Insurance is asking that we refax note with addendum to attention appeals department at [phone] fax  for approval of the CT chest. She wants this imaging done at [facility] once approved.   I changed the breast/ axillary urgent order at [facility] to limited so hopefully they will schedule her.

## 2019-10-18 NOTE — Telephone Encounter (Signed)
Patient called to get an update on the referral.  She stated she is returning Sarah's call. CB# [phone]

## 2019-10-18 NOTE — Addendum Note (Signed)
Addended by: Doreen Beam on: 10/18/2019 10:58 AM   Modules accepted: Orders

## 2019-10-18 NOTE — Telephone Encounter (Signed)
Sarah in referrals spoke with patient.

## 2019-10-19 DIAGNOSIS — Z13220 Encounter for screening for lipoid disorders: Secondary | ICD-10-CM | POA: Diagnosis not present

## 2019-10-19 DIAGNOSIS — R7401 Elevation of levels of liver transaminase levels: Secondary | ICD-10-CM | POA: Diagnosis not present

## 2019-10-19 DIAGNOSIS — R1011 Right upper quadrant pain: Secondary | ICD-10-CM | POA: Diagnosis not present

## 2019-10-19 DIAGNOSIS — R8290 Unspecified abnormal findings in urine: Secondary | ICD-10-CM | POA: Diagnosis not present

## 2019-10-19 DIAGNOSIS — R110 Nausea: Secondary | ICD-10-CM | POA: Diagnosis not present

## 2019-10-19 DIAGNOSIS — R7989 Other specified abnormal findings of blood chemistry: Secondary | ICD-10-CM | POA: Diagnosis not present

## 2019-10-19 DIAGNOSIS — Z1329 Encounter for screening for other suspected endocrine disorder: Secondary | ICD-10-CM | POA: Diagnosis not present

## 2019-10-22 ENCOUNTER — Telehealth: Payer: Self-pay | Admitting: Adult Health

## 2019-10-22 ENCOUNTER — Other Ambulatory Visit: Payer: Self-pay | Admitting: Adult Health

## 2019-10-22 ENCOUNTER — Encounter: Payer: Self-pay | Admitting: Adult Health

## 2019-10-22 DIAGNOSIS — R1011 Right upper quadrant pain: Secondary | ICD-10-CM | POA: Diagnosis not present

## 2019-10-22 DIAGNOSIS — K219 Gastro-esophageal reflux disease without esophagitis: Secondary | ICD-10-CM | POA: Diagnosis not present

## 2019-10-22 DIAGNOSIS — D473 Essential (hemorrhagic) thrombocythemia: Secondary | ICD-10-CM

## 2019-10-22 DIAGNOSIS — R0789 Other chest pain: Secondary | ICD-10-CM | POA: Diagnosis not present

## 2019-10-22 DIAGNOSIS — D75839 Thrombocytosis, unspecified: Secondary | ICD-10-CM

## 2019-10-22 DIAGNOSIS — R7401 Elevation of levels of liver transaminase levels: Secondary | ICD-10-CM

## 2019-10-22 DIAGNOSIS — E559 Vitamin D deficiency, unspecified: Secondary | ICD-10-CM

## 2019-10-22 MED ORDER — VITAMIN D (ERGOCALCIFEROL) 1.25 MG (50000 UNIT) PO CAPS: 50000.0000 [IU] | ORAL_CAPSULE | ORAL | 0 refills | Status: DC

## 2019-10-22 NOTE — Progress Notes (Signed)
Add on labs with [facility] :  1.lipid panel for screening for cholesterol  2.GGT, Nucleotidase 5' Blood to existing labs for elevated ALT diagnosis.  3.MPL/ CALR mutation panel for diagnosis of chronic thrombocytosis ( elevated platelets)  -------------------------------------------------------------------------------------------------- CMP- shows normal glucose and electrolytes, ALT is mildly elevated , however alk phos and AST is within normal and this is reassuring - avoid Tylenol when able. Allergy medications can sometimes raise. Increase hydration.  Albumin is slightly elevated- however still within normal range for standard deviation range.   TSH- shows thyroid level within normal range.   CBC- shows no signs of infection, or anemia,  chronic history of elevated platelets,  avoid NSAID's, aspirin, Pamprin, Excedrin, BC powders etc.  I also had Dr. Rogue Bussing in hematology review her chart and he feels i reveiwed the chart- Elevated platelets likely benign problem; however if patient needs reassurance would be happy to see her. Thanks GB  Iron panel is within normal limits.  Amylase and lipase are within normal limits reassuring for no significant gallbladder/ pancreas concerns.   Sed rate and CRP are  normal reassuring for no significant inflammation.   H pylori serum still pending results.   INR, PT, aPTT clotting times are all within normal limits.  B12 and folate within normal limits.   Vitamin D level is low, will send prescription Vitamin D to take 50,000 units to take ONCE by mouth ONCE weekly for 12 weeks. Let me know if patient is in agreement and which pharmacy to send script to. Should NOT  take over the counter while taking prescription Vitamin D.   Recheck Vitamin D lab and CBC, and hepatic panel  in three months.   Add future labs recheck orders 3 months or after  CBC, hepatic function panel and vitamin D level .

## 2019-10-22 NOTE — Progress Notes (Signed)
Meds ordered this encounter  Medications  . Vitamin D, Ergocalciferol, (DRISDOL) 1.25 MG (50000 UNIT) CAPS capsule    Sig: Take 1 capsule (50,000 Units total) by mouth every 7 (seven) days. Only taking ONCE weekly as above NOT everyday.    Dispense:  12 capsule    Refill:  0   Sent above script to [facility] in [place].  Recheck following labs on or after 3 months - recheck 01/22/2020  I have reviewed all the lab results. Consulted Hematology as well regarding chronic thrombocytosis.   Orders Placed This Encounter  Procedures  . CBC with Differential/Platelet    Standing Status:   Future    Standing Expiration Date:   04/23/2020  . Hepatic function panel    Standing Status:   Future    Standing Expiration Date:   04/23/2020  . VITAMIN D 25 Hydroxy (Vit-D Deficiency, Fractures)    Standing Status:   Future    Standing Expiration Date:   04/23/2020   I note some minor lab abnormalities that have been stable over time, these are of doubtful clinical significance; and have been reviewed by hematology in consult as well with provider.

## 2019-10-23 ENCOUNTER — Other Ambulatory Visit: Payer: Self-pay | Admitting: Adult Health

## 2019-10-23 LAB — PT AND PTT
INR: 1 (ref 0.9–1.2)
Prothrombin Time: 10.6 s (ref 9.1–12.0)
aPTT: 29 s (ref 24–33)

## 2019-10-23 LAB — CBC WITH DIFFERENTIAL/PLATELET
Basophils Absolute: 0.1 10*3/uL (ref 0.0–0.2)
Basos: 1 %
EOS (ABSOLUTE): 0.3 10*3/uL (ref 0.0–0.4)
Eos: 3 %
Hematocrit: 41.4 % (ref 34.0–46.6)
Hemoglobin: 13.7 g/dL (ref 11.1–15.9)
Immature Grans (Abs): 0 10*3/uL (ref 0.0–0.1)
Immature Granulocytes: 0 %
Lymphocytes Absolute: 2.4 10*3/uL (ref 0.7–3.1)
Lymphs: 28 %
MCH: 28.3 pg (ref 26.6–33.0)
MCHC: 33.1 g/dL (ref 31.5–35.7)
MCV: 86 fL (ref 79–97)
Monocytes Absolute: 0.6 10*3/uL (ref 0.1–0.9)
Monocytes: 7 %
Neutrophils Absolute: 5.3 10*3/uL (ref 1.4–7.0)
Neutrophils: 61 %
Platelets: 510 10*3/uL — ABNORMAL HIGH (ref 150–450)
RBC: 4.84 x10E6/uL (ref 3.77–5.28)
RDW: 12.4 % (ref 11.7–15.4)
WBC: 8.7 10*3/uL (ref 3.4–10.8)

## 2019-10-23 LAB — HEPATITIS PANEL, ACUTE
Hep A IgM: NEGATIVE
Hep B C IgM: NEGATIVE
Hep C Virus Ab: <0.1 {s_co_ratio} (ref 0.0–0.9)
Hepatitis B Surface Ag: NEGATIVE

## 2019-10-23 LAB — COMPREHENSIVE METABOLIC PANEL WITH GFR
ALT: 35 [IU]/L — ABNORMAL HIGH (ref 0–32)
AST: 23 [IU]/L (ref 0–40)
Albumin/Globulin Ratio: 1.9 (ref 1.2–2.2)
Albumin: 4.9 g/dL — ABNORMAL HIGH (ref 3.8–4.8)
Alkaline Phosphatase: 83 [IU]/L (ref 39–117)
BUN/Creatinine Ratio: 15 (ref 9–23)
BUN: 12 mg/dL (ref 6–20)
Bilirubin Total: 0.6 mg/dL (ref 0.0–1.2)
CO2: 24 mmol/L (ref 20–29)
Calcium: 9.8 mg/dL (ref 8.7–10.2)
Chloride: 97 mmol/L (ref 96–106)
Creatinine, Ser: 0.82 mg/dL (ref 0.57–1.00)
GFR calc Af Amer: 109 mL/min/{1.73_m2}
GFR calc non Af Amer: 94 mL/min/{1.73_m2}
Globulin, Total: 2.6 g/dL (ref 1.5–4.5)
Glucose: 84 mg/dL (ref 65–99)
Potassium: 4.5 mmol/L (ref 3.5–5.2)
Sodium: 137 mmol/L (ref 134–144)
Total Protein: 7.5 g/dL (ref 6.0–8.5)

## 2019-10-23 LAB — H PYLORI, IGM, IGG, IGA AB
H pylori, IgM Abs: <9 units (ref 0.0–8.9)
H. pylori, IgA Abs: <9 units (ref 0.0–8.9)
H. pylori, IgG AbS: 0.19 Index Value (ref 0.00–0.79)

## 2019-10-23 LAB — H. PYLORI BREATH TEST: H pylori Breath Test: NEGATIVE

## 2019-10-23 LAB — IRON AND TIBC
Iron Saturation: 36 % (ref 15–55)
Iron: 129 ug/dL (ref 27–159)
Total Iron Binding Capacity: 357 ug/dL (ref 250–450)
UIBC: 228 ug/dL (ref 131–425)

## 2019-10-23 LAB — B12 AND FOLATE PANEL
Folate: 9.6 ng/mL
Vitamin B-12: 413 pg/mL (ref 232–1245)

## 2019-10-23 LAB — HIGH SENSITIVITY CRP: CRP, High Sensitivity: 2.78 mg/L (ref 0.00–3.00)

## 2019-10-23 LAB — AMYLASE: Amylase: 60 U/L (ref 31–110)

## 2019-10-23 LAB — LIPASE: Lipase: 40 U/L (ref 14–72)

## 2019-10-23 LAB — SEDIMENTATION RATE: Sed Rate: 2 mm/hr (ref 0–32)

## 2019-10-23 LAB — VITAMIN D 25 HYDROXY (VIT D DEFICIENCY, FRACTURES): Vit D, 25-Hydroxy: 21.5 ng/mL — ABNORMAL LOW (ref 30.0–100.0)

## 2019-10-23 LAB — TSH: TSH: 1.47 u[IU]/mL (ref 0.450–4.500)

## 2019-10-24 ENCOUNTER — Telehealth: Payer: Self-pay

## 2019-10-24 DIAGNOSIS — R1011 Right upper quadrant pain: Secondary | ICD-10-CM | POA: Diagnosis not present

## 2019-10-24 DIAGNOSIS — N6310 Unspecified lump in the right breast, unspecified quadrant: Secondary | ICD-10-CM | POA: Diagnosis not present

## 2019-10-24 NOTE — Telephone Encounter (Signed)
Patient refused mammogram. [name] in referrals had spoken with [facility].  Can you call them and verify that information with them ?

## 2019-10-24 NOTE — Telephone Encounter (Signed)
I contacted [facility] and spoke with Collie Siad, she said she did not know where Ree was at the moment. When she reviewed over patients orders with me she said in order for patient to have ultrasound patient needed to have mammogram as well since they was a questionable mass. I informed her that patient had declined mammogram and referral for ultrasound was placed back in and corrected on 10/17/19, she states that patient came in this afternoon at one and from what she saw on her end is that patient had mammogram and ultrasound today.KW

## 2019-10-24 NOTE — Telephone Encounter (Signed)
Ree, with [facility] radiology and mammograghy called stating that they need an order for a mammogram and ultrasound. They received an order for just a breast US for evaluation of  Right upper quadrant abdominal pain  N63.10 (ICD-10-CM) - Mass of breast, right   but they will need both the Mammo and [place]. Patient is waiting now and they cant proceed until they have this order faxed to them.   Fax number is [phone]

## 2019-10-24 NOTE — Progress Notes (Signed)
  Results released to [facility]  H pylori breath test is negative. H Pylori antibodies for current and past infection were also negative.  Total cholesterol and LDL elevated.  Discuss lifestyle modification with patient e.g. increase exercise, fiber, fruits, vegetables, lean meat, and omega 3/fish intake and decrease saturated fat.  If patient following strict diet and exercise program already please schedule follow up appointment with primary care physician. Adding a mercury free fish oil would benefit triglycerides.    GGT is within normal, meaning ALT elevation likely due to medications.  MPL mutation analysis for elevated platelets is  still pending.

## 2019-10-25 ENCOUNTER — Other Ambulatory Visit: Payer: Self-pay | Admitting: Adult Health

## 2019-10-25 ENCOUNTER — Telehealth: Payer: Self-pay

## 2019-10-25 ENCOUNTER — Encounter: Payer: Self-pay | Admitting: Adult Health

## 2019-10-25 DIAGNOSIS — R0782 Intercostal pain: Secondary | ICD-10-CM

## 2019-10-25 DIAGNOSIS — N6310 Unspecified lump in the right breast, unspecified quadrant: Secondary | ICD-10-CM

## 2019-10-25 DIAGNOSIS — R7401 Elevation of levels of liver transaminase levels: Secondary | ICD-10-CM

## 2019-10-25 DIAGNOSIS — D75839 Thrombocytosis, unspecified: Secondary | ICD-10-CM

## 2019-10-25 DIAGNOSIS — R0602 Shortness of breath: Secondary | ICD-10-CM

## 2019-10-25 NOTE — Telephone Encounter (Signed)
I placed one this morning in case they needed, however I know the person you spoke with yesterday told you patient had  mammogram and ultrasound of right breast done already at 1 pm on 10/24/19

## 2019-10-25 NOTE — Telephone Encounter (Signed)
Done and thank you

## 2019-10-25 NOTE — Telephone Encounter (Signed)
Im confused on this message for order because  when I called yesterday and spoke with tech I was told patient had ultrasound and mammogram and nothing was needed. I have no idea what order there referring to because it looks to me that it is in there correctly. KW

## 2019-10-25 NOTE — Progress Notes (Signed)
Ultrasound of right breast and diagnostic mammogram was not diagnostic of area palpated and reported by patient. She has shortness of breath and wheezing at times not relieved by inhalers.   Will CT angiogram to rule out Pulmonary embolism, soft tissue mass versus metastasis or other lung disease. Previous chest x ray was negative.   Shortness of breath - Plan: CT Angio Chest W/Cm &/Or Wo Cm  Thrombocytosis (HCC)  Elevated alanine aminotransferase (ALT) level  Intercostal pain - Plan: CT Angio Chest W/Cm &/Or Wo Cm   Orders Placed This Encounter  Procedures  . CT Angio Chest W/Cm &/Or Wo Cm    Scheduling Instructions:     She has CT abdomen and pelvis scheduled next week if can get together, do not cancel CT abdomen and pelvis says will not be covered if canceled. Can be done at separate time.    Order Specific Question:   ** REASON FOR EXAM (FREE TEXT)    Answer:   right sided palpated mass chest/ [place] of breast was non diagnostic as well as mammogram , has enlarged in size, shortness of breath rule out  pulmonary embolism ,  chest wall pain, intermittent    Order Specific Question:   If indicated for the ordered procedure, I authorize the administration of contrast media per Radiology protocol    Answer:   Yes    Order Specific Question:   Is patient pregnant?    Answer:   No    Order Specific Question:   Preferred imaging location?    Answer:   [facility]    Order Specific Question:   Call Results- Best Contact Number?    Answer:   [id number]    Order Specific Question:   Radiology Contrast Protocol - do NOT remove file path    Answer:   [email or web address]

## 2019-10-25 NOTE — Telephone Encounter (Signed)
Copied from [place] [phone]. Topic: General - Other >> Oct 25, 2019 12:28 PM Leward Quan A wrote: Reason for CRM: Bebe with [facility] mammography called to speak to a nurse regarding an order that they are waiting on for the patient. They spoke with someone on 10/24/19 but don't remember the name but states that it is imperative that this order is sent. Please call Ph# [phone] speak to any nano tech that answers...Marland KitchenMarland Kitchen

## 2019-10-25 NOTE — Telephone Encounter (Signed)
Spoke with another tech since Bebe had left for the evening they she said that they still; dont have order for mammogram, she was asking that it be faxed to [phone], I will print off mamogram order and have you sign and fax that over. KW

## 2019-10-26 ENCOUNTER — Telehealth: Payer: Self-pay

## 2019-10-26 NOTE — Telephone Encounter (Signed)
Copied from [place] [id number]. Topic: General - Inquiry >> Oct 26, 2019  9:34 AM Greggory Keen D wrote: Reason for CRM: Peggy with centralized scheduling at [facility] saying the outpatinet imaging wants to kow if the order for the CT Abd/pel can be with contrast.  That is what the imaging tech recommends.  Can the order be changed.

## 2019-10-26 NOTE — Telephone Encounter (Signed)
Order can be changed to contrast abdominal CT/ pelvis- and I will sign.  Are they saying IV contrast - she was picking up oral contrast ?

## 2019-10-26 NOTE — Telephone Encounter (Signed)
Left detailed message for Gaetano Hawthorne at radiology, if radiologist calls back it is okay for [facility] triage nurse to advise of message below. KW

## 2019-10-26 NOTE — Progress Notes (Signed)
GGT and 5 nucleotidase is within normal limits. Indicates liver likely not cause of liver enzyme elevation and likely medication induced. Recheck as directed of liver enzymes in future.  Panel lab still pending.

## 2019-10-29 ENCOUNTER — Other Ambulatory Visit: Payer: Self-pay | Admitting: Adult Health

## 2019-10-29 ENCOUNTER — Ambulatory Visit
Admission: RE | Admit: 2019-10-29 | Discharge: 2019-10-29 | Disposition: A | Discharge status: Home / Self Care | Payer: BC Managed Care – PPO | Source: Ambulatory Visit | Attending: Adult Health | Admitting: Adult Health

## 2019-10-29 ENCOUNTER — Other Ambulatory Visit: Payer: Self-pay

## 2019-10-29 DIAGNOSIS — R1011 Right upper quadrant pain: Secondary | ICD-10-CM | POA: Insufficient documentation

## 2019-10-29 DIAGNOSIS — R140 Abdominal distension (gaseous): Secondary | ICD-10-CM | POA: Insufficient documentation

## 2019-10-29 DIAGNOSIS — Z8249 Family history of ischemic heart disease and other diseases of the circulatory system: Secondary | ICD-10-CM | POA: Insufficient documentation

## 2019-10-29 DIAGNOSIS — R0782 Intercostal pain: Secondary | ICD-10-CM | POA: Insufficient documentation

## 2019-10-29 DIAGNOSIS — R05 Cough: Secondary | ICD-10-CM | POA: Diagnosis not present

## 2019-10-29 DIAGNOSIS — R935 Abnormal findings on diagnostic imaging of other abdominal regions, including retroperitoneum: Secondary | ICD-10-CM | POA: Insufficient documentation

## 2019-10-29 DIAGNOSIS — R112 Nausea with vomiting, unspecified: Secondary | ICD-10-CM | POA: Insufficient documentation

## 2019-10-29 DIAGNOSIS — R0602 Shortness of breath: Secondary | ICD-10-CM

## 2019-10-29 DIAGNOSIS — R1013 Epigastric pain: Secondary | ICD-10-CM | POA: Diagnosis not present

## 2019-10-29 DIAGNOSIS — R110 Nausea: Secondary | ICD-10-CM | POA: Diagnosis not present

## 2019-10-29 LAB — MPL MUTATION ANALYSIS

## 2019-10-29 LAB — LIPID PANEL
Chol/HDL Ratio: 4.5 ratio — ABNORMAL HIGH (ref 0.0–4.4)
Cholesterol, Total: 233 mg/dL — ABNORMAL HIGH (ref 100–199)
HDL: 52 mg/dL (ref >39)
LDL Chol Calc (NIH): 147 mg/dL — ABNORMAL HIGH (ref 0–99)
Triglycerides: 186 mg/dL — ABNORMAL HIGH (ref 0–149)
VLDL Cholesterol Cal: 34 mg/dL (ref 5–40)

## 2019-10-29 LAB — SPECIMEN STATUS REPORT

## 2019-10-29 LAB — NUCLEOTIDASE, 5', BLOOD: 5-Nucleotidase: 5 IU/L (ref 0–10)

## 2019-10-29 LAB — GAMMA GT: GGT: 27 IU/L (ref 0–60)

## 2019-10-29 MED ORDER — IOHEXOL 300 MG/ML  SOLN
100.0000 mL | Freq: Once | INTRAMUSCULAR | Status: AC | PRN
  Administered 2019-10-29: 100 mL via INTRAVENOUS

## 2019-10-30 ENCOUNTER — Other Ambulatory Visit: Payer: Self-pay | Admitting: Adult Health

## 2019-10-30 ENCOUNTER — Telehealth: Payer: Self-pay | Admitting: Adult Health

## 2019-10-30 DIAGNOSIS — K769 Liver disease, unspecified: Secondary | ICD-10-CM

## 2019-10-30 NOTE — Progress Notes (Signed)
IMPRESSION: 1. No definite acute or inflammatory process identified in the abdomen or pelvis. Gallbladder and appendix appear normal.  2. Nonspecific but benign appearing dense 3.3 cm calcification of the posterior right lobe of the liver. And a few other scattered small nonspecific hypodense areas in the liver whih are also favored to be benign. As necessary, an Abdomen MRI with liver protocol (without and with contrast) might characterize further.  3. 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   Electronically Signed   By: Genevie Ann M.D.   On: 10/29/2019 15:27

## 2019-10-30 NOTE — Telephone Encounter (Signed)
Pt's insurance company would like to speak peer to peer to get both CTA and MRI approved.Phone [phone]. Member # [id number]

## 2019-10-30 NOTE — Telephone Encounter (Signed)
Peer to peer completed by provider.  [id number] MRI abdomen with liver protocol   [id number] CT angiogram chest   Approval numbers as above and sent to referrals.

## 2019-10-30 NOTE — Progress Notes (Signed)
MRI abdomen  w and wo contrast with liver profile order placed 10/30/19 1:10pm  Patient denies any metal, implants, devices or eye implants. 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

## 2019-10-31 ENCOUNTER — Other Ambulatory Visit: Payer: Self-pay | Admitting: Adult Health

## 2019-10-31 ENCOUNTER — Ambulatory Visit
Admission: RE | Admit: 2019-10-31 | Discharge: 2019-10-31 | Disposition: A | Discharge status: Home / Self Care | Payer: BC Managed Care – PPO | Source: Ambulatory Visit | Attending: Adult Health | Admitting: Adult Health

## 2019-10-31 ENCOUNTER — Other Ambulatory Visit: Payer: Self-pay

## 2019-10-31 DIAGNOSIS — K769 Liver disease, unspecified: Secondary | ICD-10-CM | POA: Insufficient documentation

## 2019-10-31 DIAGNOSIS — R0782 Intercostal pain: Secondary | ICD-10-CM | POA: Diagnosis not present

## 2019-10-31 DIAGNOSIS — R058 Other specified cough: Secondary | ICD-10-CM

## 2019-10-31 DIAGNOSIS — R1011 Right upper quadrant pain: Secondary | ICD-10-CM | POA: Insufficient documentation

## 2019-10-31 DIAGNOSIS — R062 Wheezing: Secondary | ICD-10-CM

## 2019-10-31 DIAGNOSIS — Z8742 Personal history of other diseases of the female genital tract: Secondary | ICD-10-CM

## 2019-10-31 DIAGNOSIS — R0602 Shortness of breath: Secondary | ICD-10-CM | POA: Diagnosis not present

## 2019-10-31 DIAGNOSIS — Q766 Other congenital malformations of ribs: Secondary | ICD-10-CM

## 2019-10-31 DIAGNOSIS — K7689 Other specified diseases of liver: Secondary | ICD-10-CM | POA: Diagnosis not present

## 2019-10-31 DIAGNOSIS — Z90721 Acquired absence of ovaries, unilateral: Secondary | ICD-10-CM

## 2019-10-31 DIAGNOSIS — Z9189 Other specified personal risk factors, not elsewhere classified: Secondary | ICD-10-CM

## 2019-10-31 DIAGNOSIS — Z08 Encounter for follow-up examination after completed treatment for malignant neoplasm: Secondary | ICD-10-CM

## 2019-10-31 MED ORDER — IOHEXOL 350 MG/ML SOLN
75.0000 mL | Freq: Once | INTRAVENOUS | Status: AC | PRN
  Administered 2019-10-31: 75 mL via INTRAVENOUS

## 2019-10-31 MED ORDER — GADOBUTROL 1 MMOL/ML IV SOLN
7.0000 mL | Freq: Once | INTRAVENOUS | Status: AC | PRN
  Administered 2019-10-31: 7 mL via INTRAVENOUS

## 2019-11-01 ENCOUNTER — Other Ambulatory Visit: Payer: Self-pay | Admitting: Adult Health

## 2019-11-01 ENCOUNTER — Encounter: Payer: Self-pay | Admitting: Adult Health

## 2019-11-01 DIAGNOSIS — R0782 Intercostal pain: Secondary | ICD-10-CM

## 2019-11-01 DIAGNOSIS — R062 Wheezing: Secondary | ICD-10-CM

## 2019-11-01 DIAGNOSIS — R0602 Shortness of breath: Secondary | ICD-10-CM

## 2019-11-01 MED ORDER — DOXYCYCLINE HYCLATE 100 MG PO TABS: 100.0000 mg | ORAL_TABLET | Freq: Two times a day (BID) | ORAL | 0 refills | Status: DC

## 2019-11-01 NOTE — Progress Notes (Signed)
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 Will monitor for any increased weakness, or any trouble swallowing. Liver lesions appear to be stable.  Gatroenterology Dr. Jonathon Bellows consulted with provider and in agreement MRI findings on liver looks benign- could consider repeating MRI once more in 6-8 months to ensure stable. Patient is advised she can follow back up with gastric MD.  Patient was called with results, advised 3 month follow up appointment to discuss how she is feeling. She does have a productive cough. Will treat questionable infectious process with Doxycycline 100 mg BID for at least 10 days after lab work. Avoid sun while on antibiotic will burn easily.

## 2019-11-01 NOTE — Progress Notes (Signed)
CT shows no pulmonary nodules or pulmonary embolism.   Right middle lobe 45mm pulmonary nodule.   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 The rib spits into two ends on the sternum side.   Provider had Dr. Patsey Berthold in pulmonary review film as well and she personally viewed films and reported nodule is almost imperceptible.  I suspect is is an inflammatory nodule.  The patient is non-smoker -  as a precaution a chest CT without contrast could be repeated in a year.

## 2019-11-02 ENCOUNTER — Other Ambulatory Visit: Payer: Self-pay | Admitting: Adult Health

## 2019-11-02 DIAGNOSIS — R0782 Intercostal pain: Secondary | ICD-10-CM | POA: Diagnosis not present

## 2019-11-02 DIAGNOSIS — R8290 Unspecified abnormal findings in urine: Secondary | ICD-10-CM | POA: Insufficient documentation

## 2019-11-02 DIAGNOSIS — M25572 Pain in left ankle and joints of left foot: Secondary | ICD-10-CM | POA: Diagnosis not present

## 2019-11-02 DIAGNOSIS — R0602 Shortness of breath: Secondary | ICD-10-CM

## 2019-11-02 DIAGNOSIS — R062 Wheezing: Secondary | ICD-10-CM | POA: Diagnosis not present

## 2019-11-02 DIAGNOSIS — Z9189 Other specified personal risk factors, not elsewhere classified: Secondary | ICD-10-CM

## 2019-11-02 DIAGNOSIS — R1084 Generalized abdominal pain: Secondary | ICD-10-CM

## 2019-11-02 DIAGNOSIS — R05 Cough: Secondary | ICD-10-CM | POA: Diagnosis not present

## 2019-11-02 DIAGNOSIS — M2550 Pain in unspecified joint: Secondary | ICD-10-CM | POA: Diagnosis not present

## 2019-11-02 DIAGNOSIS — Z207 Contact with and (suspected) exposure to pediculosis, acariasis and other infestations: Secondary | ICD-10-CM | POA: Insufficient documentation

## 2019-11-02 DIAGNOSIS — Z1389 Encounter for screening for other disorder: Secondary | ICD-10-CM

## 2019-11-02 DIAGNOSIS — R399 Unspecified symptoms and signs involving the genitourinary system: Secondary | ICD-10-CM | POA: Diagnosis not present

## 2019-11-02 DIAGNOSIS — R1011 Right upper quadrant pain: Secondary | ICD-10-CM | POA: Diagnosis not present

## 2019-11-02 LAB — URINALYSIS, ROUTINE W REFLEX MICROSCOPIC
Bilirubin, UA: NEGATIVE
Glucose, UA: NEGATIVE
Ketones, UA: NEGATIVE
Leukocytes,UA: NEGATIVE
Nitrite, UA: NEGATIVE
Protein,UA: NEGATIVE
RBC, UA: NEGATIVE
Specific Gravity, UA: 1.014 (ref 1.005–1.030)
Urobilinogen, Ur: 0.2 mg/dL (ref 0.2–1.0)
pH, UA: 6.5 (ref 5.0–7.5)

## 2019-11-02 NOTE — Progress Notes (Signed)
Urinalysis is negative

## 2019-11-02 NOTE — Telephone Encounter (Signed)
These appear to be mucous plugs. She has evidence of small airways diz on CT. Ususally seen with asthma or reactive airways

## 2019-11-03 LAB — COMPREHENSIVE METABOLIC PANEL
ALT: 16 IU/L (ref 0–32)
AST: 15 IU/L (ref 0–40)
Albumin/Globulin Ratio: 1.7 (ref 1.2–2.2)
Albumin: 4.7 g/dL (ref 3.8–4.8)
Alkaline Phosphatase: 81 IU/L (ref 48–121)
BUN/Creatinine Ratio: 11 (ref 9–23)
BUN: 10 mg/dL (ref 6–20)
Bilirubin Total: <0.2 mg/dL (ref 0.0–1.2)
CO2: 20 mmol/L (ref 20–29)
Calcium: 9.5 mg/dL (ref 8.7–10.2)
Chloride: 100 mmol/L (ref 96–106)
Creatinine, Ser: 0.88 mg/dL (ref 0.57–1.00)
GFR calc Af Amer: 100 mL/min/{1.73_m2} (ref >59)
GFR calc non Af Amer: 87 mL/min/{1.73_m2} (ref >59)
Globulin, Total: 2.8 g/dL (ref 1.5–4.5)
Glucose: 90 mg/dL (ref 65–99)
Potassium: 4.7 mmol/L (ref 3.5–5.2)
Sodium: 137 mmol/L (ref 134–144)
Total Protein: 7.5 g/dL (ref 6.0–8.5)

## 2019-11-03 LAB — CBC WITH DIFFERENTIAL/PLATELET
Basophils Absolute: 0.1 10*3/uL (ref 0.0–0.2)
Basos: 1 %
EOS (ABSOLUTE): 0.5 10*3/uL — ABNORMAL HIGH (ref 0.0–0.4)
Eos: 4 %
Hematocrit: 42.5 % (ref 34.0–46.6)
Hemoglobin: 14 g/dL (ref 11.1–15.9)
Immature Grans (Abs): 0 10*3/uL (ref 0.0–0.1)
Immature Granulocytes: 0 %
Lymphocytes Absolute: 3 10*3/uL (ref 0.7–3.1)
Lymphs: 26 %
MCH: 27.9 pg (ref 26.6–33.0)
MCHC: 32.9 g/dL (ref 31.5–35.7)
MCV: 85 fL (ref 79–97)
Monocytes Absolute: 1 10*3/uL — ABNORMAL HIGH (ref 0.1–0.9)
Monocytes: 9 %
Neutrophils Absolute: 6.7 10*3/uL (ref 1.4–7.0)
Neutrophils: 60 %
Platelets: 557 10*3/uL — ABNORMAL HIGH (ref 150–450)
RBC: 5.02 x10E6/uL (ref 3.77–5.28)
RDW: 12.5 % (ref 11.7–15.4)
WBC: 11.2 10*3/uL — ABNORMAL HIGH (ref 3.4–10.8)

## 2019-11-05 ENCOUNTER — Other Ambulatory Visit: Payer: Self-pay | Admitting: Adult Health

## 2019-11-05 DIAGNOSIS — Z9189 Other specified personal risk factors, not elsewhere classified: Secondary | ICD-10-CM | POA: Diagnosis not present

## 2019-11-05 DIAGNOSIS — R062 Wheezing: Secondary | ICD-10-CM

## 2019-11-05 DIAGNOSIS — R0602 Shortness of breath: Secondary | ICD-10-CM | POA: Insufficient documentation

## 2019-11-05 DIAGNOSIS — J4540 Moderate persistent asthma, uncomplicated: Secondary | ICD-10-CM | POA: Insufficient documentation

## 2019-11-05 LAB — PARASITE EXAM, BLOOD

## 2019-11-05 NOTE — Progress Notes (Signed)
Would like patient be scheduled for a  follow up within the month to discuss further her shortness of breath and wheezing as well as discuss  recommenced ultrasound timing for ovarian cyst. All information  sent to Mychart as well.   Continue using Symbicort as directed. Use albuterol for any wheezing episodes.Continue allergy medication. Report any new or changing/ worsening symptoms.  If she continues coughing up mucous plugs, with the above, I can do a trial of increasing her Symbicort inhaler short term. Repeat imaging is recommended as discussed on phone call.   Please let patient know, urine with gram negative rods. Likely from not being clean catch. She is on Doxycyline currently and should cover respiratory and urine.  CBC wbc is scantly elevated with scant monocytes and eosinophils.  Tick borne illness and TB test all still pending.   CBC repeat in 3 months. an recheck it with vitamin D level that will be due simultaneously.

## 2019-11-06 LAB — STRIATED MUSCLE ANTIBODY: Anti-striation Abs: NEGATIVE

## 2019-11-06 LAB — ACETYLCHOLINE RECEPTOR, BINDING: AChR Binding Ab, Serum: <0.03 nmol/L (ref 0.00–0.24)

## 2019-11-06 NOTE — Progress Notes (Signed)
No change in therapy. Continue doxycycline.

## 2019-11-06 NOTE — Progress Notes (Signed)
CMP within normal limits kidney, liver function and electrolytes. E chaffeensis for [place] tick disease exposure in past (IgG) or current (IgM)   is negative.  HGE for tick borne illness exposure in past or current is negative. Other tests still pending.

## 2019-11-06 NOTE — Progress Notes (Signed)
Myasthenia gravis panel negative. Parasite infection malaria prep is negative.

## 2019-11-07 LAB — QUANTIFERON-TB GOLD PLUS
QuantiFERON Mitogen Value: >10 IU/mL
QuantiFERON Nil Value: 0.03 IU/mL
QuantiFERON TB1 Ag Value: 0.03 IU/mL
QuantiFERON TB2 Ag Value: 0.03 IU/mL
QuantiFERON-TB Gold Plus: NEGATIVE

## 2019-11-07 NOTE — Progress Notes (Signed)
[facility] Spotted Fever test for past or present infection is negative.

## 2019-11-08 LAB — OVA AND PARASITE EXAMINATION

## 2019-11-08 LAB — LYME AB/WESTERN BLOT REFLEX
LYME DISEASE AB, QUANT, IGM: <0.8 index (ref 0.00–0.79)
Lyme IgG/IgM Ab: <0.91 {ISR} (ref 0.00–0.90)

## 2019-11-08 LAB — ROCKY MTN SPOTTED FVR ABS PNL(IGG+IGM)
RMSF IgG: NEGATIVE
RMSF IgM: 0.41 index (ref 0.00–0.89)

## 2019-11-08 LAB — EHRLICHIA ANTIBODY PANEL
E. Chaffeensis (HME) IgM Titer: NEGATIVE
E.Chaffeensis (HME) IgG: NEGATIVE
HGE IgG Titer: NEGATIVE
HGE IgM Titer: NEGATIVE

## 2019-11-08 LAB — CULTURE, URINE COMPREHENSIVE

## 2019-11-08 NOTE — Progress Notes (Signed)
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 Return for repeat urine if any urinary symptoms.

## 2019-11-14 ENCOUNTER — Encounter: Payer: Self-pay | Admitting: Adult Health

## 2019-11-14 ENCOUNTER — Telehealth (INDEPENDENT_AMBULATORY_CARE_PROVIDER_SITE_OTHER): Payer: BC Managed Care – PPO | Admitting: Adult Health

## 2019-11-14 DIAGNOSIS — N83201 Unspecified ovarian cyst, right side: Secondary | ICD-10-CM

## 2019-11-14 DIAGNOSIS — R0602 Shortness of breath: Secondary | ICD-10-CM | POA: Diagnosis not present

## 2019-11-14 DIAGNOSIS — Z90721 Acquired absence of ovaries, unilateral: Secondary | ICD-10-CM

## 2019-11-14 DIAGNOSIS — D75839 Thrombocytosis, unspecified: Secondary | ICD-10-CM

## 2019-11-14 DIAGNOSIS — N838 Other noninflammatory disorders of ovary, fallopian tube and broad ligament: Secondary | ICD-10-CM

## 2019-11-14 DIAGNOSIS — R7401 Elevation of levels of liver transaminase levels: Secondary | ICD-10-CM

## 2019-11-14 DIAGNOSIS — Z8249 Family history of ischemic heart disease and other diseases of the circulatory system: Secondary | ICD-10-CM

## 2019-11-14 DIAGNOSIS — G4700 Insomnia, unspecified: Secondary | ICD-10-CM

## 2019-11-14 DIAGNOSIS — R0683 Snoring: Secondary | ICD-10-CM | POA: Diagnosis not present

## 2019-11-14 DIAGNOSIS — Z8241 Family history of sudden cardiac death: Secondary | ICD-10-CM

## 2019-11-14 DIAGNOSIS — R062 Wheezing: Secondary | ICD-10-CM

## 2019-11-14 DIAGNOSIS — E559 Vitamin D deficiency, unspecified: Secondary | ICD-10-CM

## 2019-11-14 DIAGNOSIS — F419 Anxiety disorder, unspecified: Secondary | ICD-10-CM

## 2019-11-14 DIAGNOSIS — R600 Localized edema: Secondary | ICD-10-CM

## 2019-11-14 DIAGNOSIS — Z820 Family history of epilepsy and other diseases of the nervous system: Secondary | ICD-10-CM

## 2019-11-14 DIAGNOSIS — N83291 Other ovarian cyst, right side: Secondary | ICD-10-CM | POA: Insufficient documentation

## 2019-11-14 DIAGNOSIS — D473 Essential (hemorrhagic) thrombocythemia: Secondary | ICD-10-CM

## 2019-11-14 DIAGNOSIS — R5383 Other fatigue: Secondary | ICD-10-CM

## 2019-11-14 DIAGNOSIS — J4540 Moderate persistent asthma, uncomplicated: Secondary | ICD-10-CM

## 2019-11-14 MED ORDER — IPRATROPIUM BROMIDE HFA 17 MCG/ACT IN AERS: 2.0000 | INHALATION_SPRAY | Freq: Four times a day (QID) | RESPIRATORY_TRACT | 2 refills | Status: DC | PRN

## 2019-11-14 MED ORDER — TRAZODONE HCL 50 MG PO TABS: 25.0000 mg | ORAL_TABLET | Freq: Every evening | ORAL | 0 refills | Status: DC | PRN

## 2019-11-14 NOTE — Patient Instructions (Signed)
Meds ordered this encounter  Medications  . traZODone (DESYREL) 50 MG tablet    Sig: Take 0.5-1 tablets (25-50 mg total) by mouth at bedtime as needed for sleep.    Dispense:  90 tablet    Refill:  0  . ipratropium (ATROVENT HFA) 17 MCG/ACT inhaler    Sig: Inhale 2 puffs into the lungs every 6 (six) hours as needed for wheezing.    Dispense:  1 Inhaler    Refill:  2   Orders Placed This Encounter  Procedures  . US Pelvic Complete With Transvaginal  . CBC with Differential/Platelet  . Comprehensive Metabolic Panel (CMET)  . VITAMIN D 25 Hydroxy (Vit-D Deficiency, Fractures)  . Ambulatory referral to Sleep Studies  . ECHOCARDIOGRAM COMPLETE  go for repeat labs the week after you finish your prescription vitamin D   Advised patient call the office or your primary care doctor for an appointment if no improvement within 72 hours or if any symptoms change or worsen at any time  Advised ER or urgent Care if after hours or on weekend. Call 911 for emergency symptoms at any time.Patinet verbalized understanding of all instructions given/reviewed and treatment plan and has no further questions or concerns at this time.        Trazodone tablets What is this medicine? TRAZODONE (TRAZ oh done) is used to treat depression. This medicine may be used for other purposes; ask your health care provider or pharmacist if you have questions. COMMON BRAND NAME(S): Desyrel What should I tell my health care provider before I take this medicine? They need to know if you have any of these conditions:  attempted suicide or thinking about it  bipolar disorder  bleeding problems  glaucoma  heart disease, or previous heart attack  irregular heart beat  kidney or liver disease  low levels of sodium in the blood  an unusual or allergic reaction to trazodone, other medicines, foods, dyes or preservatives  pregnant or trying to get pregnant  breast-feeding How should I use this medicine? Take  this medicine by mouth with a glass of water. Follow the directions on the prescription label. Take this medicine shortly after a meal or a light snack. Take your medicine at regular intervals. Do not take your medicine more often than directed. Do not stop taking this medicine suddenly except upon the advice of your doctor. Stopping this medicine too quickly may cause serious side effects or your condition may worsen. A special MedGuide will be given to you by the pharmacist with each prescription and refill. Be sure to read this information carefully each time. Talk to your pediatrician regarding the use of this medicine in children. Special care may be needed. Overdosage: If you think you have taken too much of this medicine contact a poison control center or emergency room at once. NOTE: This medicine is only for you. Do not share this medicine with others. What if I miss a dose? If you miss a dose, take it as soon as you can. If it is almost time for your next dose, take only that dose. Do not take double or extra doses. What may interact with this medicine? Do not take this medicine with any of the following medications:  certain medicines for fungal infections like fluconazole, itraconazole, ketoconazole, posaconazole, voriconazole  cisapride  dronedarone  linezolid  MAOIs like Carbex, Eldepryl, Marplan, Nardil, and Parnate  mesoridazine  methylene blue (injected into a vein)  pimozide  saquinavir  thioridazine This medicine  may also interact with the following medications:  alcohol  antiviral medicines for HIV or AIDS  aspirin and aspirin-like medicines  barbiturates like phenobarbital  certain medicines for blood pressure, heart disease, irregular heart beat  certain medicines for depression, anxiety, or psychotic disturbances  certain medicines for migraine headache like almotriptan, eletriptan, frovatriptan, naratriptan, rizatriptan, sumatriptan,  zolmitriptan  certain medicines for seizures like carbamazepine and phenytoin  certain medicines for sleep  certain medicines that treat or prevent blood clots like dalteparin, enoxaparin, warfarin  digoxin  fentanyl  lithium  NSAIDS, medicines for pain and inflammation, like ibuprofen or naproxen  other medicines that prolong the QT interval (cause an abnormal heart rhythm) like dofetilide  rasagiline  supplements like St. John's wort, kava kava, valerian  tramadol  tryptophan This list may not describe all possible interactions. Give your health care provider a list of all the medicines, herbs, non-prescription drugs, or dietary supplements you use. Also tell them if you smoke, drink alcohol, or use illegal drugs. Some items may interact with your medicine. What should I watch for while using this medicine? Tell your doctor if your symptoms do not get better or if they get worse. Visit your doctor or health care professional for regular checks on your progress. Because it may take several weeks to see the full effects of this medicine, it is important to continue your treatment as prescribed by your doctor. Patients and their families should watch out for new or worsening thoughts of suicide or depression. Also watch out for sudden changes in feelings such as feeling anxious, agitated, panicky, irritable, hostile, aggressive, impulsive, severely restless, overly excited and hyperactive, or not being able to sleep. If this happens, especially at the beginning of treatment or after a change in dose, call your health care professional. Dennis Bast may get drowsy or dizzy. Do not drive, use machinery, or do anything that needs mental alertness until you know how this medicine affects you. Do not stand or sit up quickly, especially if you are an older patient. This reduces the risk of dizzy or fainting spells. Alcohol may interfere with the effect of this medicine. Avoid alcoholic drinks. This  medicine may cause dry eyes and blurred vision. If you wear contact lenses you may feel some discomfort. Lubricating drops may help. See your eye doctor if the problem does not go away or is severe. Your mouth may get dry. Chewing sugarless gum, sucking hard candy and drinking plenty of water may help. Contact your doctor if the problem does not go away or is severe. What side effects may I notice from receiving this medicine? Side effects that you should report to your doctor or health care professional as soon as possible:  allergic reactions like skin rash, itching or hives, swelling of the face, lips, or tongue  elevated mood, decreased need for sleep, racing thoughts, impulsive behavior  confusion  fast, irregular heartbeat  feeling faint or lightheaded, falls  feeling agitated, angry, or irritable  loss of balance or coordination  painful or prolonged erections  restlessness, pacing, inability to keep still  suicidal thoughts or other mood changes  tremors  trouble sleeping  seizures  unusual bleeding or bruising Side effects that usually do not require medical attention (report to your doctor or health care professional if they continue or are bothersome):  change in sex drive or performance  change in appetite or weight  constipation  headache  muscle aches or pains  nausea This list may not describe  all possible side effects. Call your doctor for medical advice about side effects. You may report side effects to FDA at 1-800-FDA-1088. Where should I keep my medicine? Keep out of the reach of children. Store at room temperature between 15 and 30 degrees C (59 to 86 degrees F). Protect from light. Keep container tightly closed. Throw away any unused medicine after the expiration date. NOTE: This sheet is a summary. It may not cover all possible information. If you have questions about this medicine, talk to your doctor, pharmacist, or health care provider.   2020 Elsevier/Gold Standard (2018-05-23 11:46:46) Insomnia Insomnia is a sleep disorder that makes it difficult to fall asleep or stay asleep. Insomnia can cause fatigue, low energy, difficulty concentrating, mood swings, and poor performance at work or school. There are three different ways to classify insomnia:  Difficulty falling asleep.  Difficulty staying asleep.  Waking up too early in the morning. Any type of insomnia can be long-term (chronic) or short-term (acute). Both are common. Short-term insomnia usually lasts for three months or less. Chronic insomnia occurs at least three times a week for longer than three months. What are the causes? Insomnia may be caused by another condition, situation, or substance, such as:  Anxiety.  Certain medicines.  Gastroesophageal reflux disease (GERD) or other gastrointestinal conditions.  Asthma or other breathing conditions.  Restless legs syndrome, sleep apnea, or other sleep disorders.  Chronic pain.  Menopause.  Stroke.  Abuse of alcohol, tobacco, or illegal drugs.  Mental health conditions, such as depression.  Caffeine.  Neurological disorders, such as Alzheimer's disease.  An overactive thyroid (hyperthyroidism). Sometimes, the cause of insomnia may not be known. What increases the risk? Risk factors for insomnia include:  Gender. Women are affected more often than men.  Age. Insomnia is more common as you get older.  Stress.  Lack of exercise.  Irregular work schedule or working night shifts.  Traveling between different time zones.  Certain medical and mental health conditions. What are the signs or symptoms? If you have insomnia, the main symptom is having trouble falling asleep or having trouble staying asleep. This may lead to other symptoms, such as:  Feeling fatigued or having low energy.  Feeling nervous about going to sleep.  Not feeling rested in the morning.  Having trouble  concentrating.  Feeling irritable, anxious, or depressed. How is this diagnosed? This condition may be diagnosed based on:  Your symptoms and medical history. Your health care provider may ask about: ? Your sleep habits. ? Any medical conditions you have. ? Your mental health.  A physical exam. How is this treated? Treatment for insomnia depends on the cause. Treatment may focus on treating an underlying condition that is causing insomnia. Treatment may also include:  Medicines to help you sleep.  Counseling or therapy.  Lifestyle adjustments to help you sleep better. Follow these instructions at home: Eating and drinking   Limit or avoid alcohol, caffeinated beverages, and cigarettes, especially close to bedtime. These can disrupt your sleep.  Do not eat a large meal or eat spicy foods right before bedtime. This can lead to digestive discomfort that can make it hard for you to sleep. Sleep habits   Keep a sleep diary to help you and your health care provider figure out what could be causing your insomnia. Write down: ? When you sleep. ? When you wake up during the night. ? How well you sleep. ? How rested you feel the next day. ?  Any side effects of medicines you are taking. ? What you eat and drink.  Make your bedroom a dark, comfortable place where it is easy to fall asleep. ? Put up shades or blackout curtains to block light from outside. ? Use a white noise machine to block noise. ? Keep the temperature cool.  Limit screen use before bedtime. This includes: ? Watching TV. ? Using your smartphone, tablet, or computer.  Stick to a routine that includes going to bed and waking up at the same times every day and night. This can help you fall asleep faster. Consider making a quiet activity, such as reading, part of your nighttime routine.  Try to avoid taking naps during the day so that you sleep better at night.  Get out of bed if you are still awake after 15  minutes of trying to sleep. Keep the lights down, but try reading or doing a quiet activity. When you feel sleepy, go back to bed. General instructions  Take over-the-counter and prescription medicines only as told by your health care provider.  Exercise regularly, as told by your health care provider. Avoid exercise starting several hours before bedtime.  Use relaxation techniques to manage stress. Ask your health care provider to suggest some techniques that may work well for you. These may include: ? Breathing exercises. ? Routines to release muscle tension. ? Visualizing peaceful scenes.  Make sure that you drive carefully. Avoid driving if you feel very sleepy.  Keep all follow-up visits as told by your health care provider. This is important. Contact a health care provider if:  You are tired throughout the day.  You have trouble in your daily routine due to sleepiness.  You continue to have sleep problems, or your sleep problems get worse. Get help right away if:  You have serious thoughts about hurting yourself or someone else. If you ever feel like you may hurt yourself or others, or have thoughts about taking your own life, get help right away. You can go to your nearest emergency department or call:  Your local emergency services (911 in the [place]  A suicide crisis helpline, such as the [place] at 205-433-6193. This is open 24 hours a day. Summary  Insomnia is a sleep disorder that makes it difficult to fall asleep or stay asleep.  Insomnia can be long-term (chronic) or short-term (acute).  Treatment for insomnia depends on the cause. Treatment may focus on treating an underlying condition that is causing insomnia.  Keep a sleep diary to help you and your health care provider figure out what could be causing your insomnia. This information is not intended to replace advice given to you by your health care provider. Make sure you discuss  any questions you have with your health care provider. Document Revised: 05/13/2017 Document Reviewed: 03/10/2017 Elsevier Patient Education  [place]. Budesonide; Formoterol Inhalation What is this medicine? BUDESONIDE; FORMOTEROL (byoo DES oh nide; for [place] te [place]l) inhalation is a combination of 2 medicines that decrease inflammation and help to open up the airways in your lungs. It is used to treat asthma. It is also used to treat chronic obstructive pulmonary disease (COPD), including chronic bronchitis or emphysema. Do NOT use for an acute asthma or COPD attack. This medicine may be used for other purposes; ask your health care provider or pharmacist if you have questions. COMMON BRAND NAME(S): Symbicort What should I tell my health care provider before I take this medicine? They  need to know if you have any of these conditions:  bone problems  diabetes  eye disease, vision problems  heart disease  high blood pressure  history of irregular heartbeat  immune system problems  infection  liver disease  pheochromocytoma  seizures  thyroid disease  an unusual or allergic reaction to budesonide, formoterol, other medicines, foods, dyes, or preservatives  pregnant or trying to get pregnant  breast-feeding How should I use this medicine? This medicine is inhaled through the mouth. Rinse your mouth with water after use. Make sure not to swallow the water. Follow the directions on your prescription label. Do not use more often than directed. Do not stop taking except on your doctor's advice. Make sure that you are using your inhaler correctly. Ask your doctor or health care provider if you have any questions. A special MedGuide will be given to you by the pharmacist with each prescription and refill. Be sure to read this information carefully each time. Talk to your pediatrician regarding the use of this medicine in children. While this drug may be prescribed for  children as young as 72 years of age for selected conditions, precautions do apply. Overdosage: If you think you have taken too much of this medicine contact a poison control center or emergency room at once. NOTE: This medicine is only for you. Do not share this medicine with others. What if I miss a dose? If you miss a dose, use it as soon as you can. If it is almost time for your next dose, use only that dose. Do not use double or extra doses. What may interact with this medicine? Do not take the medicine with any of the following medications:  cisapride  dofetilide  dronedarone  MAOIs like Marplan, Nardil, and Parnate  other medicines that contain long-acting beta-2 agonists (LABAs) like arfomoterol, formoterol, indacaterol, olodaterol, salmeterol, vilanterol  pimozide  procarbazine  thioridazine This medicine may also interact with the following medications:  certain antibiotics like clarithromycin, telithromycin  certain antivirals for HIV or hepatitis  certain heart medicines like atenolol, metoprolol  certain medicines for blood pressure, heart disease, irregular heartbeat  certain medicines for depression, anxiety, or psychotic disturbances  certain medicines for fungal infections like ketoconazole, itraconazole  diuretics  grapefruit juice  mifepristone  other medicines that prolong the QT interval (an abnormal heart rhythm)  some vaccines  steroid medicines like prednisone or cortisone  stimulant medicines for attention disorders, weight loss, or to stay awake  theophylline This list may not describe all possible interactions. Give your health care provider a list of all the medicines, herbs, non-prescription drugs, or dietary supplements you use. Also tell them if you smoke, drink alcohol, or use illegal drugs. Some items may interact with your medicine. What should I watch for while using this medicine? Visit your health care professional for regular  checks on your progress. Tell your health care professional if your symptoms do not start to get better or if they get worse. If your symptoms get worse or if you need your short-acting inhalers more often, call your doctor right away. This medicine may increase your risk of getting an infection. Tell your doctor or health care professional if you are around anyone with measles or chickenpox, or if you develop sores or blisters that do not heal properly. What side effects may I notice from receiving this medicine? Side effects that you should report to your doctor or health care professional as soon as possible:  allergic  reactions like skin rash, itching or hives, swelling of the face, lips, or tongue  anxious  breathing problems  changes in vision, eye pain  muscle cramps or muscle pain  signs and symptoms of a dangerous change in heartbeat or heart rhythm like chest pain; dizziness; fast or irregular heartbeat; palpitations; feeling faint or lightheaded, falls; breathing problems  signs and symptoms of high blood sugar such as being more thirsty or hungry or having to urinate more than normal. You may also feel very tired or have blurry vision  signs and symptoms of infection like fever; chills; cough; sore throat; pain or trouble passing urine  tremors  unusually weak or tired  white patches in the mouth or mouth sores Side effects that usually do not require medical attention (report these to your doctor or health care professional if they continue or are bothersome):  back pain  changes in taste  cough  diarrhea  runny or stuffy nose  upset stomach This list may not describe all possible side effects. Call your doctor for medical advice about side effects. You may report side effects to FDA at 1-800-FDA-1088. Where should I keep my medicine? Keep out of the reach of children. Store in a dry place at room temperature between 20 and 25 degrees C (68 and 77 degrees F).  Do not get the inhaler wet. Keep track of the number of doses used. Throw away the inhaler after using the marked number of inhalations or after the expiration date, whichever comes first. Do not burn or puncture canister. NOTE: This sheet is a summary. It may not cover all possible information. If you have questions about this medicine, talk to your doctor, pharmacist, or health care provider.  2020 Elsevier/Gold Standard (2019-01-15 15:50:03) Ipratropium aerosol inhaler What is this medicine? IPRATROPIUM (i pra TROE pee um) is a bronchodilator. It helps open up the airways in your lungs to make it easier to breathe. This medicine is used to treat chronic obstructive pulmonary disease (COPD), including emphysema and chronic bronchitis. Do not use this medicine for an acute attack. This medicine may be used for other purposes; ask your health care provider or pharmacist if you have questions. COMMON BRAND NAME(S): Atrovent What should I tell my health care provider before I take this medicine? They need to know if you have any of the following conditions:  bladder problems or difficulty passing urine  glaucoma  heart disease or irregular heartbeat  prostate trouble  an unusual or allergic reaction to ipratropium, atropine, bromides, soya protein, peanut oil, soybeans or peanuts, other medicines, foods, dyes, or preservatives  pregnant or trying to get pregnant  breast-feeding How should I use this medicine? This medicine is inhaled through the mouth. Follow the directions on your prescription label. Do not use more often than directed. Do not stop taking except on your doctor's advice. Make sure that you are using your inhaler correctly. Ask you doctor or health care provider if you have any questions. Talk to your pediatrician regarding the use of this medicine in children. Special care may be needed. Overdosage: If you think you have taken too much of this medicine contact a poison  control center or emergency room at once. NOTE: This medicine is only for you. Do not share this medicine with others. What if I miss a dose? If you miss a dose, use it as soon as you can. If it is almost time for your next dose, use only that dose. Do not  use double or extra doses. What may interact with this medicine?  atropine, hyoscyamine, and related medications  medicines for motion sickness or dizziness  medicines for overactive bladder  some medicines for colds  some medicines for stomach problems This list may not describe all possible interactions. Give your health care provider a list of all the medicines, herbs, non-prescription drugs, or dietary supplements you use. Also tell them if you smoke, drink alcohol, or use illegal drugs. Some items may interact with your medicine. What should I watch for while using this medicine? Visit your doctor for regular checks on your progress. Tell your doctor or health care professional if your symptoms do not improve. Do not use extra medicine. If your breathing gets worse or if you need short acting inhalers more often, call your doctor right away. What side effects may I notice from receiving this medicine? Side effects that you should report to your doctor or health care professional as soon as possible:  allergic reactions like skin rash, itching or hives, swelling of the face, lips, or tongue  chest pain or fast heartbeat  difficulty breathing or wheezing that increases or does not go away  dizziness or fainting spell  eye pain or change in vision  low blood pressure Side effects that usually do not require medical attention (report to your doctor or health care professional if they continue or are bothersome):  cough  dry eyes, mouth or nose  headache  stomach upset  trouble passing urine  unusual taste or metallic taste in your mouth This list may not describe all possible side effects. Call your doctor for medical  advice about side effects. You may report side effects to FDA at 1-800-FDA-1088. Where should I keep my medicine? Keep out of the reach of children. Store at a room temperature between 15 and 30 degrees C (59 and 86 degrees F). Avoid excessive humidity. The contents may burst when exposed to heat or flame. Do not freeze. Throw away the canister after 200 uses or after the expiration date, whichever comes first. NOTE: This sheet is a summary. It may not cover all possible information. If you have questions about this medicine, talk to your doctor, pharmacist, or health care provider.  2020 Elsevier/Gold Standard (2014-03-13 13:08:05) Asthma, Adult  Asthma is a long-term (chronic) condition in which the airways get tight and narrow. The airways are the breathing passages that lead from the nose and mouth down into the lungs. A person with asthma will have times when symptoms get worse. These are called asthma attacks. They can cause coughing, whistling sounds when you breathe (wheezing), shortness of breath, and chest pain. They can make it hard to breathe. There is no cure for asthma, but medicines and lifestyle changes can help control it. There are many things that can bring on an asthma attack or make asthma symptoms worse (triggers). Common triggers include:  Mold.  Dust.  Cigarette smoke.  Cockroaches.  Things that can cause allergy symptoms (allergens). These include animal skin flakes (dander) and pollen from trees or grass.  Things that pollute the air. These may include household cleaners, wood smoke, smog, or chemical odors.  Cold air, weather changes, and wind.  Crying or laughing hard.  Stress.  Certain medicines or drugs.  Certain foods such as dried fruit, potato chips, and grape juice.  Infections, such as a cold or the flu.  Certain medical conditions or diseases.  Exercise or tiring activities. Asthma may be treated with  medicines and by staying away from the  things that cause asthma attacks. Types of medicines may include:  Controller medicines. These help prevent asthma symptoms. They are usually taken every day.  Fast-acting reliever or rescue medicines. These quickly relieve asthma symptoms. They are used as needed and provide short-term relief.  Allergy medicines if your attacks are brought on by allergens.  Medicines to help control the body's defense (immune) system. Follow these instructions at home: Avoiding triggers in your home  Change your heating and air conditioning filter often.  Limit your use of fireplaces and wood stoves.  Get rid of pests (such as roaches and mice) and their droppings.  Throw away plants if you see mold on them.  Clean your floors. Dust regularly. Use cleaning products that do not smell.  Have someone vacuum when you are not home. Use a vacuum cleaner with a HEPA filter if possible.  Replace carpet with wood, tile, or vinyl flooring. Carpet can trap animal skin flakes and dust.  Use allergy-proof pillows, mattress covers, and box spring covers.  Wash bed sheets and blankets every week in hot water. Dry them in a dryer.  Keep your bedroom free of any triggers.  Avoid pets and keep windows closed when things that cause allergy symptoms are in the air.  Use blankets that are made of polyester or cotton.  Clean bathrooms and kitchens with bleach. If possible, have someone repaint the walls in these rooms with mold-resistant paint. Keep out of the rooms that are being cleaned and painted.  Wash your hands often with soap and water. If soap and water are not available, use hand sanitizer.  Do not allow anyone to smoke in your home. General instructions  Take over-the-counter and prescription medicines only as told by your doctor. ? Talk with your doctor if you have questions about how or when to take your medicines. ? Make note if you need to use your medicines more often than usual.  Do not use  any products that contain nicotine or tobacco, such as cigarettes and e-cigarettes. If you need help quitting, ask your doctor.  Stay away from secondhand smoke.  Avoid doing things outdoors when allergen counts are high and when air quality is low.  Wear a ski mask when doing outdoor activities in the winter. The mask should cover your nose and mouth. Exercise indoors on cold days if you can.  Warm up before you exercise. Take time to cool down after exercise.  Use a peak flow meter as told by your doctor. A peak flow meter is a tool that measures how well the lungs are working.  Keep track of the peak flow meter's readings. Write them down.  Follow your asthma action plan. This is a written plan for taking care of your asthma and treating your attacks.  Make sure you get all the shots (vaccines) that your doctor recommends. Ask your doctor about a flu shot and a pneumonia shot.  Keep all follow-up visits as told by your doctor. This is important. Contact a doctor if:  You have wheezing, shortness of breath, or a cough even while taking medicine to prevent attacks.  The mucus you cough up (sputum) is thicker than usual.  The mucus you cough up changes from clear or white to yellow, green, gray, or bloody.  You have problems from the medicine you are taking, such as: ? A rash. ? Itching. ? Swelling. ? Trouble breathing.  You need reliever medicines more  than 2-3 times a week.  Your peak flow reading is still at 50-79% of your personal best after following the action plan for 1 hour.  You have a fever. Get help right away if:  You seem to be worse and are not responding to medicine during an asthma attack.  You are short of breath even at rest.  You get short of breath when doing very little activity.  You have trouble eating, drinking, or talking.  You have chest pain or tightness.  You have a fast heartbeat.  Your lips or fingernails start to turn blue.  You are  light-headed or dizzy, or you faint.  Your peak flow is less than 50% of your personal best.  You feel too tired to breathe normally. Summary  Asthma is a long-term (chronic) condition in which the airways get tight and narrow. An asthma attack can make it hard to breathe.  Asthma cannot be cured, but medicines and lifestyle changes can help control it.  Make sure you understand how to avoid triggers and how and when to use your medicines. This information is not intended to replace advice given to you by your health care provider. Make sure you discuss any questions you have with your health care provider. Document Revised: 08/03/2018 Document Reviewed: 07/05/2016 Elsevier Patient Education  [place]. Shortness of Breath, Adult Shortness of breath means you have trouble breathing. Shortness of breath could be a sign of a medical problem. Follow these instructions at home:   Watch for any changes in your symptoms.  Do not use any products that contain nicotine or tobacco, such as cigarettes, e-cigarettes, and chewing tobacco.  Do not smoke. Smoking can cause shortness of breath. If you need help to quit smoking, ask your doctor.  Avoid things that can make it harder to breathe, such as: ? Mold. ? Dust. ? Air pollution. ? Chemical smells. ? Things that can cause allergy symptoms (allergens), if you have allergies.  Keep your living space clean. Use products that help remove mold and dust.  Rest as needed. Slowly return to your normal activities.  Take over-the-counter and prescription medicines only as told by your doctor. This includes oxygen therapy and inhaled medicines.  Keep all follow-up visits as told by your doctor. This is important. Contact a doctor if:  Your condition does not get better as soon as expected.  You have a hard time doing your normal activities, even after you rest.  You have new symptoms. Get help right away if:  Your shortness of  breath gets worse.  You have trouble breathing when you are resting.  You feel light-headed or you pass out (faint).  You have a cough that is not helped by medicines.  You cough up blood.  You have pain with breathing.  You have pain in your chest, arms, shoulders, or belly (abdomen).  You have a fever.  You cannot walk up stairs.  You cannot exercise the way you normally do. These symptoms may represent a serious problem that is an emergency. Do not wait to see if the symptoms will go away. Get medical help right away. Call your local emergency services (911 in the [place] Do not drive yourself to the hospital. Summary  Shortness of breath is when you have trouble breathing enough air. It can be a sign of a medical problem.  Avoid things that make it hard for you to breathe, such as smoking, pollution, mold, and dust.  Watch for  any changes in your symptoms. Contact your doctor if you do not get better or you get worse. This information is not intended to replace advice given to you by your health care provider. Make sure you discuss any questions you have with your health care provider. Document Revised: 10/31/2017 Document Reviewed: 10/31/2017 Elsevier Patient Education  [place].

## 2019-11-14 NOTE — Progress Notes (Signed)
MyChart Video Visit    Virtual Visit via Video Note   This visit type was conducted due to national recommendations for restrictions regarding the COVID-19 Pandemic (e.g. social distancing) in an effort to limit this patient's exposure and mitigate transmission in our community. This patient is at least at moderate risk for complications without adequate follow up. This format is felt to be most appropriate for this patient at this time. Physical exam was limited by quality of the video and audio technology used for the visit.   Patient location: at home - patient prefers virtual visit due to Covid 19.  Provider location: Provider: Provider's office at  [facility], [place].     Patient: [name]   DOB: Oct 04, 1985   34 y.o. Female  MRN: [id number] Visit Date: 11/14/2019  Today's healthcare provider: Marcille Buffy, FNP   No chief complaint on file.  Subjective    Shortness of Breath This is a chronic problem. The current episode started more than 1 year ago. The problem occurs intermittently. The problem has been gradually worsening. Associated symptoms include abdominal pain (ruq ), chest pain (right side ), leg swelling (intermittent ), orthopnea and sputum production (mucous plugs appearnce from Mychart message patinet sent ). Pertinent negatives include no claudication, coryza, ear pain, fever, headaches, hemoptysis, leg pain, neck pain, PND, rash, rhinorrhea, sore throat, swollen glands, syncope, vomiting or wheezing. The symptoms are aggravated by pollens, exercise, lying flat and URIs. The patient has no known risk factors for DVT/PE. She has tried rest, steroid inhalers and leukotriene antagonists for the symptoms. The treatment provided mild relief. Her past medical history is significant for allergies, asthma and bronchiolitis. There is no history of aspirin allergies, CAD, chronic lung disease, COPD, DVT, a heart failure, PE, pneumonia or  a recent surgery.   Wheezing  This is a recurrent problem. The current episode started February 2020. She uses Symbicort daily. She wakes her self wheezing.  She takes Singulair  - occasional.  She has not tried Atrovent. She says albuterol does not help.  She snores very loud at night, her mom and her grandmother have sleep apnea.   The problem has been unchanged. Associated symptoms include shortness of breath. Pertinent negatives include no abdominal pain, chest pain, chills, coryza, coughing, diarrhea, ear pain, fever, headaches, hemoptysis, neck pain, rash, rhinorrhea, sore throat, sputum production, swollen glands or vomiting. Nothing aggravates the symptoms. She has tried steroid inhaler (patient states that she had slight improvement on Symbicort) for the symptoms. Her past medical history is significant for asthma. There is no history of pneumonia.    Finishes Doxycycline Saturday.   Pain in right upper quadrant is slightly improved but still occurs. Not as constant as before. She still has   Patient  denies any fever, body aches,chills, rash, nausea, vomiting, or diarrhea.  Denies dizziness, lightheadedness, pre syncopal or syncopal episodes.    EXAM:10/29/19 CHEST - 2 VIEW  COMPARISON:  CT Abdomen and Pelvis today reported separately.  FINDINGS: Normal lung volumes and mediastinal contours. Visualized tracheal air column is within normal limits. Both lungs appear clear. No pneumothorax or pleural effusion.  No osseous abnormality identified. Negative visible bowel gas pattern. Oval 27 mm coarse calcification in the right upper quadrant is related to the liver on today's CT. Otherwise negative visible upper abdominal visceral contours.  IMPRESSION: 1. Negative chest.  No cardiopulmonary abnormality. 2.  CT Abdomen and Pelvis today are reported separately.  EXAM:10/31/19 CT ANGIOGRAPHY CHEST WITH CONTRAST  TECHNIQUE: Multidetector CT imaging of the chest was  performed using the standard protocol during bolus administration of intravenous contrast. Multiplanar CT image reconstructions and MIPs were obtained to evaluate the vascular anatomy.  CONTRAST:  49mL OMNIPAQUE IOHEXOL 350 MG/ML SOLN  COMPARISON:  Chest radiograph 10/29/2019  FINDINGS: Cardiovascular: There are no filling defects within the pulmonary arteries to suggest pulmonary embolus. Thoracic aorta is normal in caliber without dissection. Heart is normal in size. There is no pericardial effusion.  Mediastinum/Nodes: Minimal soft tissue density in the anterior mediastinum is most consistent with residual or recurrent thymus. No mediastinal or hilar adenopathy. No axillary adenopathy. Decompressed esophagus. No visualized thyroid nodule.  Lungs/Pleura: Heterogeneous pulmonary parenchyma which can be seen with small airways disease. 4 mm subpleural nodule in the right middle lobe, series 7, image 44. No confluent consolidation or focal airspace disease. No significant septal thickening. No pleural fluid. The trachea and mainstem bronchi are patent.  Upper Abdomen: Assessed on MRI earlier today and CT 2 days ago. Contrast from prior CT is now in the colon. Again seen coarse calcification in the right lobe of the liver and scattered low-density lesions, characterized on MRI.  Musculoskeletal: No chest wall soft tissue mass or inflammatory change. No focal fluid collection. Unremarkable thoracic musculature. There are no acute or suspicious osseous abnormalities. The left anterior third rib is bifid, normal variant.  Review of the MIP images confirms the above findings.  IMPRESSION: 1. No pulmonary embolus.  No aortic dissection or aneurysm. 2. Heterogeneous pulmonary parenchyma which can be seen with small airways disease. 3. Right middle lobe 4 mm subpleural nodule. This is likely post infectious or inflammatory in a patient of this age, Fleischner criteria for  pulmonary nodules do not apply. In the absence of known risk factors (history of malignancy) no dedicated further imaging follow-up is recommended. 4. No chest wall soft tissue mass. No findings to explain right-sided chest pain.   Father - prostate cancer. extensive family history.   Mammogram- ultrasound was within normal.   Patient Active Problem List   Diagnosis Date Noted  . Loud snoring 11/14/2019  . Elevated alanine aminotransferase (ALT) level 11/14/2019  . Enlarged ovary - seen on CT 2021 11/14/2019  . Other ovarian cyst, right side 11/14/2019  . S/P removal of left ovary 11/14/2019  . Asthma, allergic, moderate persistent, uncomplicated [id number]  . Shortness of breath 11/05/2019  . Exposure to parasitic disease 11/02/2019  . Abnormal result on screening urine test 11/02/2019  . Right upper quadrant abdominal pain 10/31/2019  . At high risk for tick borne illness 10/31/2019  . Exposure to mold 10/18/2019  . Wheezing 10/18/2019  . Mass of breast, right- noted since December 2019  10/12/2019  . Intercostal pain 10/12/2019  . Screening for blood or protein in urine 10/12/2019  . Insomnia 10/12/2019  . Abdominal distension (gaseous) 10/12/2019  . History of iron deficiency anemia 10/12/2019  . Excessive vitamin B12 intake 10/12/2019  . Abnormal CT scan, pelvis 10/12/2019  . Gastroesophageal reflux disease 10/12/2019  . Nausea and vomiting 06/26/2019  . Allergy to bee sting 12/19/2017  . Thrombocytosis ([place]) 12/19/2017  . Family history of aortic aneurysm 03/28/2017  . Anxiety 07/07/2016  . Cubital tunnel syndrome on right 06/24/2015  . De Quervain's disease (radial styloid tenosynovitis) 06/24/2015  . Disorder of left external ear 06/06/2015  . Nutritional anemia 01/06/2015  . Vitamin D deficiency 01/06/2015  . Pain in left ankle and  joints of left foot 01/06/2015  . 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 Dyspepsia 09/17/2014  . Epigastric pain 09/17/2014  .  Tarsal tunnel syndrome of right side 07/30/2014  . Abdominal pain 12/05/2012   No past medical history on file. Social History   Tobacco Use  . Smoking status: Never Smoker  . Smokeless tobacco: Never Used  Substance Use Topics  . Alcohol use: No  . Drug use: No   Allergies  Allergen Reactions  . Benzonatate Shortness Of Breath  . Cephalexin Anaphylaxis and Shortness Of Breath  . Codeine Swelling and Other (See Comments)    Other reaction Other reaction   . Hornet Venom Anaphylaxis  . Prednisone Anaphylaxis and Shortness Of Breath    Received Celestone injection in past per Dr. Jarold Song   . Pseudoephedrine Other (See Comments)    CNS Disorder CNS Disorder   . Pseudoephedrine Hcl Other (See Comments)    No allergic, per pt  . Hydrocodone-Acetaminophen Swelling  . Nitrofurantoin Rash    Ulcers on tongue  . Oxycodone-Acetaminophen Swelling  . Percocet [Oxycodone-Acetaminophen] Swelling  . Vicodin [Hydrocodone-Acetaminophen] Swelling      Medications: Outpatient Medications Prior to Visit  Medication Sig  . albuterol (VENTOLIN HFA) 108 (90 Base) MCG/ACT inhaler SMARTSIG:2 Puff(s) By Mouth Every 6 Hours PRN  . ascorbic acid (VITAMIN C) 500 MG tablet Take by mouth.  . Cholecalciferol 125 MCG (5000 UT) capsule Take by mouth.  . cyanocobalamin 1000 MCG tablet Take by mouth.  . doxycycline (VIBRA-TABS) 100 MG tablet Take 1 tablet (100 mg total) by mouth 2 (two) times daily for 14 days.  Marland Kitchen EPINEPHrine 0.3 mg/0.3 mL IJ SOAJ injection Inject into the muscle.  . hydrochlorothiazide (HYDRODIURIL) 25 MG tablet TAKE 1/2 TO 1 TABLET BY MOUTH DAILY AS NEEDED FOR FLUID RETENTION  . ketorolac (TORADOL) 10 MG tablet   . LORazepam (ATIVAN) 0.5 MG tablet Take 1 tablet (0.5 mg total) by mouth 2 (two) times daily as needed for anxiety.  . methocarbamol (ROBAXIN) 500 MG tablet TAKE 1 TABLET(500 MG) BY MOUTH THREE TIMES DAILY  . montelukast (SINGULAIR) 10 MG tablet TAKE 1 TABLET(10 MG) BY MOUTH  DAILY  . ondansetron (ZOFRAN-ODT) 8 MG disintegrating tablet Take 8 mg by mouth.  . pantoprazole (PROTONIX) 40 MG tablet Take 40 mg by mouth 2 (two) times daily.  . SUMAtriptan (IMITREX) 50 MG tablet TAKE 1 TABLET BY MOUTH AS NEEDED AT ONSET OF SYMPTOMS. MAY REPEAT IN 2 HOURS IF NEEDED. NO MORE THAN 3 DOSES IN 24 HOURS  . SYMBICORT 80-4.5 MCG/ACT inhaler Inhale 2 puffs into the lungs 2 (two) times daily.  . Vitamin D, Ergocalciferol, (DRISDOL) 1.25 MG (50000 UNIT) CAPS capsule Take 1 capsule (50,000 Units total) by mouth every 7 (seven) days. Only taking ONCE weekly as above NOT everyday.  . [DISCONTINUED] buPROPion (WELLBUTRIN XL) 300 MG 24 hr tablet Take 300 mg by mouth daily.  . [DISCONTINUED] FLUoxetine (PROZAC) 20 MG capsule Take 20 mg by mouth daily.   No facility-administered medications prior to visit.    Review of Systems  Constitutional: Negative for fever.  HENT: Negative for ear pain, rhinorrhea and sore throat.   Respiratory: Positive for sputum production (mucous plugs appearnce from Mychart message patinet sent ) and shortness of breath. Negative for hemoptysis and wheezing.   Cardiovascular: Positive for chest pain (right side ), orthopnea and leg swelling (intermittent ). Negative for claudication, syncope and PND.  Gastrointestinal: Positive for abdominal pain (ruq ). Negative for  vomiting.  Musculoskeletal: Negative for neck pain.  Skin: Negative for rash.  Neurological: Negative for headaches.    Last CBC Lab Results  Component Value Date   WBC 11.2 (H) 11/02/2019   HGB 14.0 11/02/2019   HCT 42.5 11/02/2019   MCV 85 11/02/2019   MCH 27.9 11/02/2019   RDW 12.5 11/02/2019   PLT 557 (H) 11/02/2019      Objective    There were no vitals taken for this visit. No vitals available.    Physical Exam     Patient is alert and oriented and responsive to questions Engages in conversation with provider. Speaks in full sentences without any pauses without any  shortness of breath or distress.     Assessment & Plan    Shortness of breath - Plan: ECHOCARDIOGRAM COMPLETE  Loud snoring - Plan: Ambulatory referral to Sleep Studies  Wheezing - Plan: Ambulatory referral to Sleep Studies  Cyst of right ovary  Edema of both lower extremities - Plan: ECHOCARDIOGRAM COMPLETE  Insomnia, unspecified type - Plan: traZODone (DESYREL) 50 MG tablet, Ambulatory referral to Sleep Studies  Anxiety - Plan: traZODone (DESYREL) 50 MG tablet  Family history of sudden cardiac death - Plan: ECHOCARDIOGRAM COMPLETE  Family history of CHF (congestive heart failure) - Plan: ECHOCARDIOGRAM COMPLETE  Family history of sleep apnea  Other fatigue - Plan: ECHOCARDIOGRAM COMPLETE, Ambulatory referral to Sleep Studies  Vitamin D deficiency - Plan: VITAMIN D 25 Hydroxy (Vit-D Deficiency, Fractures)  Elevated alanine aminotransferase (ALT) level - Plan: Comprehensive Metabolic Panel (CMET)  Thrombocytosis (HCC) - Plan: CBC with Differential/Platelet, ECHOCARDIOGRAM COMPLETE  Asthma, allergic, moderate persistent, uncomplicated  Other ovarian cyst, right side - Plan: US Pelvic Complete With Transvaginal  Enlarged ovary - seen on CT 2021 - Plan: US Pelvic Complete With Transvaginal  S/P removal of left ovary  CT scan showed Right ovary enlarged, 5 cm, and small cyst needs follow up with transvaginal/ pelvic ultrasound.   History of intermittent dyspnea with intermittent edema of both lower extremities, CXR was normal, will order ECHO to rule out congestive heart failure- unlikely at this age however she does have cardiac family history that is strong and can rule out valve abnormalities.    Sleep apnea on list of differentials as well with snoring, night time awakenings frequently, wheezing with lying down and fatigue. Patient prefers a home sleep study.     Will try Trazodone for sleep, start with 25 mg for 3 to 5 days and may increase to one tablet as  directed. Not to be combined with other sleep aids.   Will try Atrovent inhaler for as needed wheezing since she does not get relief with Albuterol and no longer uses. Continue allergy medication.    Meds ordered this encounter  Medications  . traZODone (DESYREL) 50 MG tablet    Sig: Take 0.5-1 tablets (25-50 mg total) by mouth at bedtime as needed for sleep.    Dispense:  90 tablet    Refill:  0  . ipratropium (ATROVENT HFA) 17 MCG/ACT inhaler    Sig: Inhale 2 puffs into the lungs every 6 (six) hours as needed for wheezing.    Dispense:  1 Inhaler    Refill:  2    Return in about 3 months (around 02/14/2020), or if symptoms worsen or fail to improve, for at any time for any worsening symptoms, Go to Emergency room/ urgent care if worse.     I discussed the assessment and treatment plan with  the patient. The patient was provided an opportunity to ask questions and all were answered. The patient agreed with the plan and demonstrated an understanding of the instructions.   The patient was advised to call back or seek an in-person evaluation if the symptoms worsen or if the condition fails to improve as anticipated.  I provided  32 minutes of non-face-to-face time during this encounter. Addressed acute and or chronic medical problems today requiring * minutes reviewing patients medical record,labs, counseling patient regarding patient's conditions, any medications, answering questions regarding health, and coordination of care and reviewing/ ordering tests as needed. After visit summary patient given copy and reviewed - sent to [facility].   Return in about 3 months (around 02/14/2020), or if symptoms worsen or fail to improve, for at any time for any worsening symptoms, Go to Emergency room/ urgent care if worse.  IWellington Hampshire Milos Milligan, FNP, have reviewed all documentation for this visit. The documentation on 11/14/19 for the exam, diagnosis, procedures, and orders are all accurate and  complete.  Marcille Buffy, [place] [phone] (phone) [phone] (fax)  [place]

## 2019-11-15 ENCOUNTER — Other Ambulatory Visit: Payer: Self-pay | Admitting: Adult Health

## 2019-11-15 DIAGNOSIS — R0602 Shortness of breath: Secondary | ICD-10-CM | POA: Diagnosis not present

## 2019-11-15 DIAGNOSIS — G4733 Obstructive sleep apnea (adult) (pediatric): Secondary | ICD-10-CM | POA: Diagnosis not present

## 2019-11-16 DIAGNOSIS — R0602 Shortness of breath: Secondary | ICD-10-CM | POA: Diagnosis not present

## 2019-11-16 DIAGNOSIS — G4733 Obstructive sleep apnea (adult) (pediatric): Secondary | ICD-10-CM | POA: Diagnosis not present

## 2019-11-20 DIAGNOSIS — M545 Low back pain: Secondary | ICD-10-CM | POA: Diagnosis not present

## 2019-11-20 DIAGNOSIS — M5417 Radiculopathy, lumbosacral region: Secondary | ICD-10-CM | POA: Diagnosis not present

## 2019-11-20 DIAGNOSIS — M9904 Segmental and somatic dysfunction of sacral region: Secondary | ICD-10-CM | POA: Diagnosis not present

## 2019-11-20 DIAGNOSIS — M9908 Segmental and somatic dysfunction of rib cage: Secondary | ICD-10-CM | POA: Diagnosis not present

## 2019-11-20 DIAGNOSIS — M9903 Segmental and somatic dysfunction of lumbar region: Secondary | ICD-10-CM | POA: Diagnosis not present

## 2019-11-20 DIAGNOSIS — R0782 Intercostal pain: Secondary | ICD-10-CM | POA: Diagnosis not present

## 2019-11-21 ENCOUNTER — Telehealth: Payer: Self-pay | Admitting: Adult Health

## 2019-11-21 ENCOUNTER — Encounter: Payer: Self-pay | Admitting: Adult Health

## 2019-11-21 DIAGNOSIS — Z8659 Personal history of other mental and behavioral disorders: Secondary | ICD-10-CM

## 2019-11-21 DIAGNOSIS — G4733 Obstructive sleep apnea (adult) (pediatric): Secondary | ICD-10-CM | POA: Insufficient documentation

## 2019-11-21 NOTE — Telephone Encounter (Signed)
Left detailed message advising patient it has been faxed. KW

## 2019-11-21 NOTE — Telephone Encounter (Signed)
11/20/2019 5:10pm Provider called patient to review over sleep study, patient does have moderate obstructive sleep apnea with hypopnea. Desaturations were noted as well as snoring on her at home sleep study through [facility].  Reviewed diagnosis with patient and treatment with self adjusting CPAP machine recommended full face mask, however patent has claustrophobia and prefers to use nasal mask only.   Patient verbalized understanding of all instructions given and denies any further questions at this time.   She would like supplies ordered through [facility] in [place].  Prescription written on script pad and CMA will fax to the above store after obtaining fax number. Will have office notify patient once faxed so she can follow up on getting her supplies.     Advised patient call the office or your primary care doctor for an appointment if no improvement within 72 hours or if any symptoms change or worsen at any time  Advised ER or urgent Care if after hours or on weekend. Call 911 for emergency symptoms at any time.Patinet verbalized understanding of all instructions given/reviewed and treatment plan and has no further questions or concerns at this time.

## 2019-11-22 ENCOUNTER — Encounter: Payer: Self-pay | Admitting: Adult Health

## 2019-11-23 ENCOUNTER — Ambulatory Visit: Payer: BC Managed Care – PPO

## 2019-11-26 ENCOUNTER — Other Ambulatory Visit: Payer: Self-pay | Admitting: Adult Health

## 2019-11-26 ENCOUNTER — Encounter: Payer: Self-pay | Admitting: Adult Health

## 2019-11-26 MED ORDER — ONDANSETRON 8 MG PO TBDP: 8.0000 mg | ORAL_TABLET | Freq: Three times a day (TID) | ORAL | 0 refills | Status: DC | PRN

## 2019-11-26 MED ORDER — SPIRIVA RESPIMAT 1.25 MCG/ACT IN AERS: 2.0000 | INHALATION_SPRAY | Freq: Every day | RESPIRATORY_TRACT | 1 refills | Status: DC

## 2019-11-26 NOTE — Progress Notes (Signed)
Meds ordered this encounter  Medications  . ondansetron (ZOFRAN-ODT) 8 MG disintegrating tablet    Sig: Take 1 tablet (8 mg total) by mouth every 8 (eight) hours as needed for nausea.    Dispense:  90 tablet    Refill:  0

## 2019-11-26 NOTE — Progress Notes (Signed)
Insurance denied Atrovent - suggested Spiriva be tried first  Meds ordered this encounter  Medications   Tiotropium Bromide Monohydrate (SPIRIVA RESPIMAT) 1.25 MCG/ACT AERS    Sig: Inhale 2 puffs into the lungs daily.    Dispense:  4 g    Refill:  1

## 2019-11-29 DIAGNOSIS — N83201 Unspecified ovarian cyst, right side: Secondary | ICD-10-CM | POA: Diagnosis not present

## 2019-11-29 DIAGNOSIS — N83291 Other ovarian cyst, right side: Secondary | ICD-10-CM | POA: Diagnosis not present

## 2019-11-29 DIAGNOSIS — N838 Other noninflammatory disorders of ovary, fallopian tube and broad ligament: Secondary | ICD-10-CM | POA: Diagnosis not present

## 2019-11-30 ENCOUNTER — Encounter: Payer: Self-pay | Admitting: Adult Health

## 2019-11-30 DIAGNOSIS — R0782 Intercostal pain: Secondary | ICD-10-CM | POA: Diagnosis not present

## 2019-11-30 DIAGNOSIS — M5417 Radiculopathy, lumbosacral region: Secondary | ICD-10-CM | POA: Diagnosis not present

## 2019-11-30 DIAGNOSIS — M9908 Segmental and somatic dysfunction of rib cage: Secondary | ICD-10-CM | POA: Diagnosis not present

## 2019-11-30 DIAGNOSIS — M545 Low back pain: Secondary | ICD-10-CM | POA: Diagnosis not present

## 2019-11-30 DIAGNOSIS — M9904 Segmental and somatic dysfunction of sacral region: Secondary | ICD-10-CM | POA: Diagnosis not present

## 2019-11-30 DIAGNOSIS — M9903 Segmental and somatic dysfunction of lumbar region: Secondary | ICD-10-CM | POA: Diagnosis not present

## 2019-12-05 DIAGNOSIS — M9908 Segmental and somatic dysfunction of rib cage: Secondary | ICD-10-CM | POA: Diagnosis not present

## 2019-12-05 DIAGNOSIS — M9904 Segmental and somatic dysfunction of sacral region: Secondary | ICD-10-CM | POA: Diagnosis not present

## 2019-12-05 DIAGNOSIS — M9903 Segmental and somatic dysfunction of lumbar region: Secondary | ICD-10-CM | POA: Diagnosis not present

## 2019-12-05 DIAGNOSIS — R0782 Intercostal pain: Secondary | ICD-10-CM | POA: Diagnosis not present

## 2019-12-05 DIAGNOSIS — M5417 Radiculopathy, lumbosacral region: Secondary | ICD-10-CM | POA: Diagnosis not present

## 2019-12-05 DIAGNOSIS — M545 Low back pain: Secondary | ICD-10-CM | POA: Diagnosis not present

## 2019-12-12 ENCOUNTER — Other Ambulatory Visit: Payer: Self-pay | Admitting: Adult Health

## 2019-12-12 MED ORDER — LEVOFLOXACIN 500 MG PO TABS: 500.0000 mg | ORAL_TABLET | Freq: Every day | ORAL | 0 refills | Status: AC

## 2019-12-12 MED ORDER — CYCLOBENZAPRINE HCL 10 MG PO TABS: 10.0000 mg | ORAL_TABLET | Freq: Every evening | ORAL | 1 refills | Status: DC | PRN

## 2019-12-13 ENCOUNTER — Telehealth: Payer: Self-pay

## 2019-12-13 DIAGNOSIS — G4733 Obstructive sleep apnea (adult) (pediatric): Secondary | ICD-10-CM | POA: Diagnosis not present

## 2019-12-13 NOTE — Telephone Encounter (Signed)
Judson Roch - will you follow up on this for ECHO.

## 2019-12-13 NOTE — Telephone Encounter (Signed)
Copied from [place] [id number]. Topic: General - Other >> Dec 13, 2019  1:33 PM Antonieta Iba C wrote: Reason for CRM: [facility] is calling in to receive authorization for imaging orders that were received. He says that the office has to reach out to the eligibility department.    Eligibility Department - [phone]   [phone] ext [phone] - for further assistance.

## 2019-12-21 DIAGNOSIS — R0602 Shortness of breath: Secondary | ICD-10-CM | POA: Diagnosis not present

## 2019-12-21 DIAGNOSIS — R5383 Other fatigue: Secondary | ICD-10-CM | POA: Diagnosis not present

## 2019-12-21 DIAGNOSIS — Z8241 Family history of sudden cardiac death: Secondary | ICD-10-CM | POA: Diagnosis not present

## 2019-12-27 ENCOUNTER — Other Ambulatory Visit: Payer: Self-pay | Admitting: Adult Health

## 2019-12-27 DIAGNOSIS — R7401 Elevation of levels of liver transaminase levels: Secondary | ICD-10-CM

## 2019-12-27 DIAGNOSIS — Z8639 Personal history of other endocrine, nutritional and metabolic disease: Secondary | ICD-10-CM | POA: Diagnosis not present

## 2019-12-27 DIAGNOSIS — R1011 Right upper quadrant pain: Secondary | ICD-10-CM

## 2019-12-27 DIAGNOSIS — D75839 Thrombocytosis, unspecified: Secondary | ICD-10-CM

## 2019-12-27 DIAGNOSIS — E559 Vitamin D deficiency, unspecified: Secondary | ICD-10-CM

## 2019-12-27 DIAGNOSIS — D473 Essential (hemorrhagic) thrombocythemia: Secondary | ICD-10-CM | POA: Diagnosis not present

## 2019-12-27 DIAGNOSIS — N6310 Unspecified lump in the right breast, unspecified quadrant: Secondary | ICD-10-CM

## 2019-12-28 LAB — CBC WITH DIFFERENTIAL/PLATELET
Basophils Absolute: 0 10*3/uL (ref 0.0–0.2)
Basos: 0 %
EOS (ABSOLUTE): 0.1 10*3/uL (ref 0.0–0.4)
Eos: 2 %
Hematocrit: 40.6 % (ref 34.0–46.6)
Hemoglobin: 13.5 g/dL (ref 11.1–15.9)
Immature Grans (Abs): 0 10*3/uL (ref 0.0–0.1)
Immature Granulocytes: 0 %
Lymphocytes Absolute: 1.6 10*3/uL (ref 0.7–3.1)
Lymphs: 16 %
MCH: 27.7 pg (ref 26.6–33.0)
MCHC: 33.3 g/dL (ref 31.5–35.7)
MCV: 83 fL (ref 79–97)
Monocytes Absolute: 0.8 10*3/uL (ref 0.1–0.9)
Monocytes: 8 %
Neutrophils Absolute: 7.1 10*3/uL — ABNORMAL HIGH (ref 1.4–7.0)
Neutrophils: 74 %
Platelets: 475 10*3/uL — ABNORMAL HIGH (ref 150–450)
RBC: 4.87 x10E6/uL (ref 3.77–5.28)
RDW: 12.3 % (ref 11.7–15.4)
WBC: 9.6 10*3/uL (ref 3.4–10.8)

## 2019-12-28 LAB — FERRITIN: Ferritin: 53 ng/mL (ref 15–150)

## 2019-12-28 LAB — IRON AND TIBC
Iron Saturation: 7 % — CL (ref 15–55)
Iron: 24 ug/dL — ABNORMAL LOW (ref 27–159)
Total Iron Binding Capacity: 323 ug/dL (ref 250–450)
UIBC: 299 ug/dL (ref 131–425)

## 2019-12-28 LAB — VITAMIN B12: Vitamin B-12: 350 pg/mL (ref 232–1245)

## 2019-12-28 LAB — VITAMIN D 25 HYDROXY (VIT D DEFICIENCY, FRACTURES): Vit D, 25-Hydroxy: 46.8 ng/mL (ref 30.0–100.0)

## 2019-12-31 ENCOUNTER — Other Ambulatory Visit: Payer: Self-pay | Admitting: Adult Health

## 2019-12-31 DIAGNOSIS — G4733 Obstructive sleep apnea (adult) (pediatric): Secondary | ICD-10-CM

## 2019-12-31 DIAGNOSIS — D508 Other iron deficiency anemias: Secondary | ICD-10-CM

## 2019-12-31 DIAGNOSIS — E559 Vitamin D deficiency, unspecified: Secondary | ICD-10-CM

## 2019-12-31 MED ORDER — FERROUS SULFATE 325 (65 FE) MG PO TABS: 325.0000 mg | ORAL_TABLET | ORAL | 1 refills | Status: DC

## 2019-12-31 MED ORDER — VITAMIN D (ERGOCALCIFEROL) 1.25 MG (50000 UNIT) PO CAPS: 50000.0000 [IU] | ORAL_CAPSULE | ORAL | 0 refills | Status: DC

## 2019-12-31 NOTE — Progress Notes (Signed)
Platelets still elevated however improved. Ferritin with normal limits.  Iron saturation is low as well as Iron. Will send in iron prescription to  [name] and she will take every other day. Take measures to prevent constipation, take with some sort of vitamin C and not to take within 2 hours of other mediations or vitamins. Can cause dark stools.  Report any continued heavy menses or rectal bleeding.   Vitamin D is improved, will continue prescription Vitamin D another 8 weeks.  Neutrophils slight increase ok is within normal variant.   Vitamin B12 low end normal, ok to take over the counter sublingual B12.   Future labs ordered.  Keep follow up visit already scheduled.   Recheck vitamin D , cbc, cmp, TIBC iron and B12 on or after 03/28/20( 3 months)

## 2019-12-31 NOTE — Progress Notes (Signed)
Labs resulted.  Meds ordered this encounter  Medications  . ferrous sulfate (FERROUSUL) 325 (65 FE) MG tablet    Sig: Take 1 tablet (325 mg total) by mouth every other day.    Dispense:  90 tablet    Refill:  1  future labs ordered.

## 2020-01-04 ENCOUNTER — Telehealth (INDEPENDENT_AMBULATORY_CARE_PROVIDER_SITE_OTHER): Payer: BC Managed Care – PPO | Admitting: Adult Health

## 2020-01-04 DIAGNOSIS — R591 Generalized enlarged lymph nodes: Secondary | ICD-10-CM | POA: Diagnosis not present

## 2020-01-04 DIAGNOSIS — L299 Pruritus, unspecified: Secondary | ICD-10-CM

## 2020-01-04 DIAGNOSIS — J0100 Acute maxillary sinusitis, unspecified: Secondary | ICD-10-CM

## 2020-01-04 DIAGNOSIS — M62838 Other muscle spasm: Secondary | ICD-10-CM | POA: Diagnosis not present

## 2020-01-04 MED ORDER — METHYLPREDNISOLONE 4 MG PO TBPK: ORAL_TABLET | ORAL | 0 refills | Status: DC

## 2020-01-04 MED ORDER — CYCLOBENZAPRINE HCL 10 MG PO TABS: 10.0000 mg | ORAL_TABLET | Freq: Every evening | ORAL | 1 refills | Status: DC | PRN

## 2020-01-04 MED ORDER — HYDROXYZINE HCL 25 MG PO TABS: 25.0000 mg | ORAL_TABLET | Freq: Three times a day (TID) | ORAL | 0 refills | Status: DC | PRN

## 2020-01-04 MED ORDER — DOXYCYCLINE HYCLATE 100 MG PO TABS: 100.0000 mg | ORAL_TABLET | Freq: Two times a day (BID) | ORAL | 0 refills | Status: DC

## 2020-01-04 NOTE — Progress Notes (Addendum)
Virtual Visit via Video Note  I connected with [name] on 01/08/20 at  1:00 PM EDT by a video enabled telemedicine application and verified that I am speaking with the correct person using two identifiers.   I discussed the limitations of evaluation and management by telemedicine and the availability of in person appointments. The patient expressed understanding and agreed to proceed.  MyChart Video Visit    Virtual Visit via Video Note   This visit type was conducted due to national recommendations for restrictions regarding the COVID-19 Pandemic (e.g. social distancing) in an effort to limit this patient's exposure and mitigate transmission in our community. This patient is at least at moderate risk for complications without adequate follow up. This format is felt to be most appropriate for this patient at this time. Physical exam was limited by quality of the video and audio technology used for the visit.   Patient location: at home  Provider location: Provider: Provider's office at  [facility], [place].      Patient: [name]   DOB: 01-25-1986   34 y.o. Female  MRN: [id number] Visit Date: 01/04/2020  Today's healthcare provider: Marcille Buffy, FNP   Virtual Visit via Video Note  I connected with [name] on 01/06/20 at  1:00 PM EDT by a video enabled telemedicine application and verified that I am speaking with the correct person using two identifiers. Patient is a 34 year old female in no acute distress who comes to the clinic for a virtual video visit.  She is doing well she does have some backaches, right-sided sinus pain/pressure, right maxillary pressure pain.  Mild nasal discharge if any.  Onset was over 1 week ago.  Patient has had mild fever.  Denies any loss of taste or smell.  She does report she has some lymphadenopathy in both sides of the front of her neck that is not worsening are extremely large.  No  noticeable lymphadenopathy on video.  Patient does point to the area of concern and can feel it herself.  That was only over a week ago onset.  Denies any loss of taste or smell.  She denies any chest congestion.  She reports she is doing well with her CPAP machine, denies any concerns or issues.  Sleeping better most nights. Patient  denies any fever, body aches,chills, rash, chest pain, shortness of breath, nausea, vomiting, or diarrhea.  Denies dizziness, lightheadedness, pre syncopal or syncopal episodes.  Denies any known Covid exposures.  She denies any mono exposures.   Chief Complaint  Patient presents with  . Back Pain   Subjective    HPI HPI    Back Pain    This is a new problem.  Recent episode started in the past 7 days.  Pain is thoracic spine.  The quality of pain is described as aching.  Severity of the pain is moderate.  Symptoms worse in nighttime.  The symptoms are aggravated by nothing.  Treatments: acetaminophen and NSAIDs.  Treatment provided no relief.  Abdominal Pain: Absent.  Bowel incontinence: Absent.  Chest pain:  Present (At bedtime).  Dysuria: Absent.  Fever: Absent.  Headaches: Present.  Joint pains: Absent.  Weakness in leg: Absent.  Pelvic pain: Absent.  Tingling in lower extremities: Absent.  Urinary incontinence: Absent.  Weight loss: Absent.       Last edited by Minette Headland, CMA on 01/04/2020 11:40 AM. (History)        Patient  Active Problem List   Diagnosis Date Noted  . History of iron deficiency 12/27/2019  . Obstructive sleep apnea hypopnea, moderate 11/21/2019  . History of claustrophobia 11/21/2019  . Loud snoring 11/14/2019  . Elevated alanine aminotransferase (ALT) level 11/14/2019  . Enlarged ovary - seen on CT 2021 11/14/2019  . Other ovarian cyst, right side 11/14/2019  . S/P removal of left ovary 11/14/2019  . Asthma, allergic, moderate persistent, uncomplicated 44/06/270  . Shortness of breath 11/05/2019  . Exposure to  parasitic disease 11/02/2019  . Abnormal result on screening urine test 11/02/2019  . Right upper quadrant abdominal pain 10/31/2019  . At high risk for tick borne illness 10/31/2019  . Exposure to mold 10/18/2019  . Wheezing 10/18/2019  . Mass of breast, right- noted since December 2019  10/12/2019  . Intercostal pain 10/12/2019  . Screening for blood or protein in urine 10/12/2019  . Insomnia 10/12/2019  . Abdominal distension (gaseous) 10/12/2019  . History of iron deficiency anemia 10/12/2019  . Excessive vitamin B12 intake 10/12/2019  . Abnormal CT scan, pelvis 10/12/2019  . Gastroesophageal reflux disease 10/12/2019  . Nausea and vomiting 06/26/2019  . Tick bite-possible [place] farm 11/22/2018  . Allergy to bee sting 12/19/2017  . Thrombocytosis ([place]) 12/19/2017  . Family history of aortic aneurysm 03/28/2017  . Anxiety 07/07/2016  . Cubital tunnel syndrome on right 06/24/2015  . De Quervain's disease (radial styloid tenosynovitis) 06/24/2015  . Disorder of left external ear 06/06/2015  . Absolute anemia 01/06/2015  . Vitamin D deficiency 01/06/2015  . Pain in left ankle and joints of left foot 01/06/2015  . 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 Dyspepsia 09/17/2014  . Epigastric pain 09/17/2014  . Tarsal tunnel syndrome of right side 07/30/2014  . Abdominal pain 12/05/2012   No past medical history on file. Social History   Socioeconomic History  . Marital status: Married    Spouse name: Not on file  . Number of children: Not on file  . Years of education: Not on file  . Highest education level: Not on file  Occupational History  . Not on file  Tobacco Use  . Smoking status: Never Smoker  . Smokeless tobacco: Never Used  Vaping Use  . Vaping Use: Never used  Substance and Sexual Activity  . Alcohol use: No  . Drug use: No  . Sexual activity: Not on file  Other Topics Concern  . Not on file  Social History Narrative  . Not on file   Social Determinants  of Health   Financial Resource Strain:   . Difficulty of Paying Living Expenses:   Food Insecurity:   . Worried About Charity fundraiser in the Last Year:   . Arboriculturist in the Last Year:   Transportation Needs:   . Film/video editor (Medical):   Marland Kitchen Lack of Transportation (Non-Medical):   Physical Activity:   . Days of Exercise per Week:   . Minutes of Exercise per Session:   Stress:   . Feeling of Stress :   Social Connections:   . Frequency of Communication with Friends and Family:   . Frequency of Social Gatherings with Friends and Family:   . Attends Religious Services:   . Active Member of Clubs or Organizations:   . Attends Archivist Meetings:   Marland Kitchen Marital Status:   Intimate Partner Violence:   . Fear of Current or Ex-Partner:   . Emotionally Abused:   .  Physically Abused:   . Sexually Abused:    Allergies  Allergen Reactions  . Benzonatate Shortness Of Breath  . Cephalexin Anaphylaxis and Shortness Of Breath  . Codeine Swelling and Other (See Comments)    Other reaction Other reaction   . Hornet Venom Anaphylaxis  . Prednisone Anaphylaxis and Shortness Of Breath    Received Celestone injection in past per Dr. Jarold Song   . Pseudoephedrine Other (See Comments)    CNS Disorder CNS Disorder   . Pseudoephedrine Hcl Other (See Comments)    No allergic, per pt  . Hydrocodone-Acetaminophen Swelling  . Nitrofurantoin Rash    Ulcers on tongue  . Oxycodone-Acetaminophen Swelling  . Percocet [Oxycodone-Acetaminophen] Swelling  . Vicodin [Hydrocodone-Acetaminophen] Swelling      Medications: Outpatient Medications Prior to Visit  Medication Sig  . albuterol (VENTOLIN HFA) 108 (90 Base) MCG/ACT inhaler SMARTSIG:2 Puff(s) By Mouth Every 6 Hours PRN  . ascorbic acid (VITAMIN C) 500 MG tablet Take by mouth.  . Cholecalciferol 125 MCG (5000 UT) capsule Take by mouth.  . cyanocobalamin 1000 MCG tablet Take by mouth.  . EPINEPHrine 0.3 mg/0.3 mL IJ  SOAJ injection Inject into the muscle.  . ferrous sulfate (FERROUSUL) 325 (65 FE) MG tablet Take 1 tablet (325 mg total) by mouth every other day.  . hydrochlorothiazide (HYDRODIURIL) 25 MG tablet TAKE 1/2 TO 1 TABLET BY MOUTH DAILY AS NEEDED FOR FLUID RETENTION  . ketorolac (TORADOL) 10 MG tablet   . LORazepam (ATIVAN) 0.5 MG tablet Take 1 tablet (0.5 mg total) by mouth 2 (two) times daily as needed for anxiety.  . montelukast (SINGULAIR) 10 MG tablet TAKE 1 TABLET(10 MG) BY MOUTH DAILY  . ondansetron (ZOFRAN-ODT) 8 MG disintegrating tablet Take 1 tablet (8 mg total) by mouth every 8 (eight) hours as needed for nausea.  . pantoprazole (PROTONIX) 40 MG tablet Take 40 mg by mouth 2 (two) times daily.  . SUMAtriptan (IMITREX) 50 MG tablet TAKE 1 TABLET BY MOUTH AS NEEDED AT ONSET OF SYMPTOMS. MAY REPEAT IN 2 HOURS IF NEEDED. NO MORE THAN 3 DOSES IN 24 HOURS  . SYMBICORT 80-4.5 MCG/ACT inhaler Inhale 2 puffs into the lungs 2 (two) times daily.  . Tiotropium Bromide Monohydrate (SPIRIVA RESPIMAT) 1.25 MCG/ACT AERS Inhale 2 puffs into the lungs daily.  . Vitamin D, Ergocalciferol, (DRISDOL) 1.25 MG (50000 UNIT) CAPS capsule Take 1 capsule (50,000 Units total) by mouth every 7 (seven) days. Only taking ONCE weekly as above NOT everyday.  . [DISCONTINUED] cyclobenzaprine (FLEXERIL) 10 MG tablet Take 1 tablet (10 mg total) by mouth at bedtime as needed for muscle spasms. Do not drive or operate machinery/ no legal decision may cause drowsiness.  . traZODone (DESYREL) 50 MG tablet Take 0.5-1 tablets (25-50 mg total) by mouth at bedtime as needed for sleep. (Patient not taking: Reported on 01/04/2020)   No facility-administered medications prior to visit.    Review of Systems    Patient is alert and oriented and responsive to questions Engages in conversation with provider. Speaks in full sentences without any pauses without any shortness of breath or distress.     Objective    There were no vitals  taken for this visit.   Physical Exam  No vital signs available.   Patient is alert and oriented and responsive to questions Engages in conversation with provider. Speaks in full sentences without any pauses without any shortness of breath or distress.   Allergies  Allergen Reactions  . Benzonatate Shortness  Of Breath  . Cephalexin Anaphylaxis and Shortness Of Breath  . Codeine Swelling and Other (See Comments)    Other reaction Other reaction   . Hornet Venom Anaphylaxis  . Prednisone Anaphylaxis and Shortness Of Breath    Received Celestone injection in past per Dr. Jarold Song   . Pseudoephedrine Other (See Comments)    CNS Disorder CNS Disorder   . Pseudoephedrine Hcl Other (See Comments)    No allergic, per pt  . Hydrocodone-Acetaminophen Swelling  . Nitrofurantoin Rash    Ulcers on tongue  . Oxycodone-Acetaminophen Swelling  . Percocet [Oxycodone-Acetaminophen] Swelling  . Vicodin [Hydrocodone-Acetaminophen] Swelling      Assessment & Plan      Acute non-recurrent maxillary sinusitis - Plan: methylPREDNISolone (MEDROL DOSEPAK) 4 MG TBPK tablet  Muscle spasm - Plan: cyclobenzaprine (FLEXERIL) 10 MG tablet  Itching  Lymphadenopathy- cervical  - Plan: methylPREDNISolone (MEDROL DOSEPAK) 4 MG TBPK tablet Will treat for nasal allergy symptoms, Flonase and antihistamine recommended.  Muscle spasms in the mid thoracic to lower back without any injury okay to fill Flexeril and use as needed.  We will try doxycycline, she was on doxycycline previously for UTI, may need to change antibiotic if no improvement within 3 to 5 days. Has taken methylprednisone in the past however has not taken prednisone due to allergy but does well with methylprednisone she reports.  Take Atarax with any other sedative or with Flexeril at the same time.  Red Flags discussed. The patient was given clear instructions to go to ER or return to medical center if any red flags develop, symptoms do not  improve, worsen or new problems develop. They verbalized understanding.    Meds ordered this encounter  Medications  . cyclobenzaprine (FLEXERIL) 10 MG tablet    Sig: Take 1 tablet (10 mg total) by mouth at bedtime as needed for muscle spasms. Do not drive or operate machinery/ no legal decision may cause drowsiness.    Dispense:  30 tablet    Refill:  1  . doxycycline (VIBRA-TABS) 100 MG tablet    Sig: Take 1 tablet (100 mg total) by mouth 2 (two) times daily.    Dispense:  20 tablet    Refill:  0  . methylPREDNISolone (MEDROL DOSEPAK) 4 MG TBPK tablet    Sig: PO: Take 6 tablets on day 1:Take 5 tablets day 2:Take 4 tablets day 3: Take 3 tablets day 4:Take 2 tablets day five: 5 Take 1 tablet day 6    Dispense:  1 tablet    Refill:  0  . hydrOXYzine (ATARAX/VISTARIL) 25 MG tablet    Sig: Take 1 tablet (25 mg total) by mouth 3 (three) times daily as needed. Will cause drowsiness.    Dispense:  30 tablet    Refill:  0   No follow-ups on file.     I discussed the assessment and treatment plan with the patient. The patient was provided an opportunity to ask questions and all were answered. The patient agreed with the plan and demonstrated an understanding of the instructions.   The patient was advised to call back or seek an in-person evaluation if the symptoms worsen or if the condition fails to improve as anticipated.  I provided 20  minutes of non-face-to-face time during this encounter.   IWellington Hampshire Hertha Gergen, FNP, have reviewed all documentation for this visit. The documentation on 01/06/20 for the exam, diagnosis, procedures, and orders are all accurate and complete.   Marcille Buffy, FNP  [place] [phone] (phone) [phone] (fax)  [place]

## 2020-01-06 ENCOUNTER — Encounter: Payer: Self-pay | Admitting: Adult Health

## 2020-01-06 NOTE — Patient Instructions (Signed)
Sinus Headache  A sinus headache happens when your sinuses get swollen or blocked (clogged). Sinuses are spaces behind the bones of your face and forehead. You may feel pain or pressure in your face, forehead, ears, or upper teeth. Sinus headaches can be mild or very bad. Follow these instructions at home: General instructions  If told: ? Apply a warm, moist washcloth to your face. This can help to lessen pain. ? Use a nasal saline wash. Follow the directions on the bottle or box. Medicines   Take over-the-counter and prescription medicines only as told by your doctor.  If you were prescribed an antibiotic medicine, take it as told by your doctor. Do not stop taking it even if you start to feel better.  Use a nose spray if your nose feels full of mucus (congested). Hydrate and humidify  Drink enough water to keep your pee (urine) pale yellow.  Use a cool mist humidifier to keep the humidity level in your home above 50%.  Breathe in steam for 10-15 minutes, 3-4 times a day or as told by your doctor. You can do this in the bathroom while a hot shower is running.  Try not to spend time in cool or dry air. Contact a doctor if:  You get more than one headache a week.  Light or sound bothers you.  You have a fever.  You feel sick to your stomach (nauseous) or you throw up (vomit).  Your headaches do not get better with treatment. Get help right away if:  You have trouble seeing.  You suddenly have very bad pain in your face or head.  You start to have quick, sudden movements or shaking that you cannot control (seizure).  You are confused.  You have a stiff neck. Summary  A sinus headache happens when your sinuses get swollen or blocked (clogged). Sinuses are spaces behind the bones of your face and forehead.  You may feel pain or pressure in your face, forehead, ears, or upper teeth.  Take over-the-counter and prescription medicines only as told by your doctor.  If  told, apply a warm, moist washcloth to your face. This can help to lessen pain. This information is not intended to replace advice given to you by your health care provider. Make sure you discuss any questions you have with your health care provider. Document Revised: 05/13/2017 Document Reviewed: 03/11/2017 Elsevier Patient Education  2020 Elsevier Inc.  Sinusitis, Adult Sinusitis is soreness and swelling (inflammation) of your sinuses. Sinuses are hollow spaces in the bones around your face. They are located:  Around your eyes.  In the middle of your forehead.  Behind your nose.  In your cheekbones. Your sinuses and nasal passages are lined with a fluid called mucus. Mucus drains out of your sinuses. Swelling can trap mucus in your sinuses. This lets germs (bacteria, virus, or fungus) grow, which leads to infection. Most of the time, this condition is caused by a virus. What are the causes? This condition is caused by:  Allergies.  Asthma.  Germs.  Things that block your nose or sinuses.  Growths in the nose (nasal polyps).  Chemicals or irritants in the air.  Fungus (rare). What increases the risk? You are more likely to develop this condition if:  You have a weak body defense system (immune system).  You do a lot of swimming or diving.  You use nasal sprays too much.  You smoke. What are the signs or symptoms? The main symptoms   of this condition are pain and a feeling of pressure around the sinuses. Other symptoms include:  Stuffy nose (congestion).  Runny nose (drainage).  Swelling and warmth in the sinuses.  Headache.  Toothache.  A cough that may get worse at night.  Mucus that collects in the throat or the back of the nose (postnasal drip).  Being unable to smell and taste.  Being very tired (fatigue).  A fever.  Sore throat.  Bad breath. How is this diagnosed? This condition is diagnosed based on:  Your symptoms.  Your medical  history.  A physical exam.  Tests to find out if your condition is short-term (acute) or long-term (chronic). Your doctor may: ? Check your nose for growths (polyps). ? Check your sinuses using a tool that has a light (endoscope). ? Check for allergies or germs. ? Do imaging tests, such as an MRI or CT scan. How is this treated? Treatment for this condition depends on the cause and whether it is short-term or long-term.  If caused by a virus, your symptoms should go away on their own within 10 days. You may be given medicines to relieve symptoms. They include: ? Medicines that shrink swollen tissue in the nose. ? Medicines that treat allergies (antihistamines). ? A spray that treats swelling of the nostrils. ? Rinses that help get rid of thick mucus in your nose (nasal saline washes).  If caused by bacteria, your doctor may wait to see if you will get better without treatment. You may be given antibiotic medicine if you have: ? A very bad infection. ? A weak body defense system.  If caused by growths in the nose, you may need to have surgery. Follow these instructions at home: Medicines  Take, use, or apply over-the-counter and prescription medicines only as told by your doctor. These may include nasal sprays.  If you were prescribed an antibiotic medicine, take it as told by your doctor. Do not stop taking the antibiotic even if you start to feel better. Hydrate and humidify   Drink enough water to keep your pee (urine) pale yellow.  Use a cool mist humidifier to keep the humidity level in your home above 50%.  Breathe in steam for 10-15 minutes, 3-4 times a day, or as told by your doctor. You can do this in the bathroom while a hot shower is running.  Try not to spend time in cool or dry air. Rest  Rest as much as you can.  Sleep with your head raised (elevated).  Make sure you get enough sleep each night. General instructions   Put a warm, moist washcloth on your  face 3-4 times a day, or as often as told by your doctor. This will help with discomfort.  Wash your hands often with soap and water. If there is no soap and water, use hand sanitizer.  Do not smoke. Avoid being around people who are smoking (secondhand smoke).  Keep all follow-up visits as told by your doctor. This is important. Contact a doctor if:  You have a fever.  Your symptoms get worse.  Your symptoms do not get better within 10 days. Get help right away if:  You have a very bad headache.  You cannot stop throwing up (vomiting).  You have very bad pain or swelling around your face or eyes.  You have trouble seeing.  You feel confused.  Your neck is stiff.  You have trouble breathing. Summary  Sinusitis is swelling of your sinuses.   Sinuses are hollow spaces in the bones around your face.  This condition is caused by tissues in your nose that become inflamed or swollen. This traps germs. These can lead to infection.  If you were prescribed an antibiotic medicine, take it as told by your doctor. Do not stop taking it even if you start to feel better.  Keep all follow-up visits as told by your doctor. This is important. This information is not intended to replace advice given to you by your health care provider. Make sure you discuss any questions you have with your health care provider. Document Revised: 10/31/2017 Document Reviewed: 10/31/2017 Elsevier Patient Education  2020 Elsevier Inc.  

## 2020-01-08 ENCOUNTER — Other Ambulatory Visit: Payer: Self-pay | Admitting: Adult Health

## 2020-01-08 MED ORDER — SUMATRIPTAN SUCCINATE 50 MG PO TABS: ORAL_TABLET | ORAL | 2 refills | Status: DC

## 2020-01-08 MED ORDER — KETOROLAC TROMETHAMINE 10 MG PO TABS: 10.0000 mg | ORAL_TABLET | Freq: Every day | ORAL | 1 refills | Status: DC | PRN

## 2020-01-08 MED ORDER — ONDANSETRON 8 MG PO TBDP: 8.0000 mg | ORAL_TABLET | Freq: Three times a day (TID) | ORAL | 0 refills | Status: DC | PRN

## 2020-01-11 DIAGNOSIS — M5417 Radiculopathy, lumbosacral region: Secondary | ICD-10-CM | POA: Diagnosis not present

## 2020-01-11 DIAGNOSIS — M9908 Segmental and somatic dysfunction of rib cage: Secondary | ICD-10-CM | POA: Diagnosis not present

## 2020-01-11 DIAGNOSIS — M545 Low back pain: Secondary | ICD-10-CM | POA: Diagnosis not present

## 2020-01-11 DIAGNOSIS — M9903 Segmental and somatic dysfunction of lumbar region: Secondary | ICD-10-CM | POA: Diagnosis not present

## 2020-01-11 DIAGNOSIS — R0782 Intercostal pain: Secondary | ICD-10-CM | POA: Diagnosis not present

## 2020-01-11 DIAGNOSIS — M9904 Segmental and somatic dysfunction of sacral region: Secondary | ICD-10-CM | POA: Diagnosis not present

## 2020-01-13 DIAGNOSIS — G4733 Obstructive sleep apnea (adult) (pediatric): Secondary | ICD-10-CM | POA: Diagnosis not present

## 2020-01-15 DIAGNOSIS — M9908 Segmental and somatic dysfunction of rib cage: Secondary | ICD-10-CM | POA: Diagnosis not present

## 2020-01-15 DIAGNOSIS — M9904 Segmental and somatic dysfunction of sacral region: Secondary | ICD-10-CM | POA: Diagnosis not present

## 2020-01-15 DIAGNOSIS — M545 Low back pain: Secondary | ICD-10-CM | POA: Diagnosis not present

## 2020-01-15 DIAGNOSIS — M9903 Segmental and somatic dysfunction of lumbar region: Secondary | ICD-10-CM | POA: Diagnosis not present

## 2020-01-15 DIAGNOSIS — R0782 Intercostal pain: Secondary | ICD-10-CM | POA: Diagnosis not present

## 2020-01-15 DIAGNOSIS — M5417 Radiculopathy, lumbosacral region: Secondary | ICD-10-CM | POA: Diagnosis not present

## 2020-01-18 ENCOUNTER — Other Ambulatory Visit: Payer: Self-pay | Admitting: Adult Health

## 2020-01-18 DIAGNOSIS — J0100 Acute maxillary sinusitis, unspecified: Secondary | ICD-10-CM

## 2020-01-18 DIAGNOSIS — R591 Generalized enlarged lymph nodes: Secondary | ICD-10-CM

## 2020-01-18 MED ORDER — BETAMETHASONE DIPROPIONATE 0.05 % EX CREA: TOPICAL_CREAM | Freq: Two times a day (BID) | CUTANEOUS | 1 refills | Status: DC

## 2020-01-18 MED ORDER — HYDROXYZINE HCL 50 MG PO TABS: 50.0000 mg | ORAL_TABLET | Freq: Three times a day (TID) | ORAL | 0 refills | Status: DC | PRN

## 2020-01-18 MED ORDER — METHYLPREDNISOLONE 4 MG PO TBPK: ORAL_TABLET | ORAL | 1 refills | Status: DC

## 2020-01-18 NOTE — Progress Notes (Signed)
Medication refill at patient request by phone. Follow up office visit advised as previously recommended.

## 2020-02-01 DIAGNOSIS — M545 Low back pain: Secondary | ICD-10-CM | POA: Diagnosis not present

## 2020-02-01 DIAGNOSIS — R0782 Intercostal pain: Secondary | ICD-10-CM | POA: Diagnosis not present

## 2020-02-01 DIAGNOSIS — M5417 Radiculopathy, lumbosacral region: Secondary | ICD-10-CM | POA: Diagnosis not present

## 2020-02-01 DIAGNOSIS — M9904 Segmental and somatic dysfunction of sacral region: Secondary | ICD-10-CM | POA: Diagnosis not present

## 2020-02-01 DIAGNOSIS — M9908 Segmental and somatic dysfunction of rib cage: Secondary | ICD-10-CM | POA: Diagnosis not present

## 2020-02-01 DIAGNOSIS — M9903 Segmental and somatic dysfunction of lumbar region: Secondary | ICD-10-CM | POA: Diagnosis not present

## 2020-02-04 ENCOUNTER — Other Ambulatory Visit: Payer: Self-pay | Admitting: Adult Health

## 2020-02-04 DIAGNOSIS — G4700 Insomnia, unspecified: Secondary | ICD-10-CM

## 2020-02-04 DIAGNOSIS — F419 Anxiety disorder, unspecified: Secondary | ICD-10-CM

## 2020-02-08 DIAGNOSIS — M9908 Segmental and somatic dysfunction of rib cage: Secondary | ICD-10-CM | POA: Diagnosis not present

## 2020-02-08 DIAGNOSIS — M545 Low back pain: Secondary | ICD-10-CM | POA: Diagnosis not present

## 2020-02-08 DIAGNOSIS — M9903 Segmental and somatic dysfunction of lumbar region: Secondary | ICD-10-CM | POA: Diagnosis not present

## 2020-02-08 DIAGNOSIS — R0782 Intercostal pain: Secondary | ICD-10-CM | POA: Diagnosis not present

## 2020-02-08 DIAGNOSIS — M9904 Segmental and somatic dysfunction of sacral region: Secondary | ICD-10-CM | POA: Diagnosis not present

## 2020-02-08 DIAGNOSIS — M5417 Radiculopathy, lumbosacral region: Secondary | ICD-10-CM | POA: Diagnosis not present

## 2020-02-11 ENCOUNTER — Telehealth: Payer: Self-pay | Admitting: Adult Health

## 2020-02-13 DIAGNOSIS — G4733 Obstructive sleep apnea (adult) (pediatric): Secondary | ICD-10-CM | POA: Diagnosis not present

## 2020-02-28 ENCOUNTER — Other Ambulatory Visit: Payer: Self-pay | Admitting: Adult Health

## 2020-02-28 DIAGNOSIS — G4700 Insomnia, unspecified: Secondary | ICD-10-CM

## 2020-02-28 DIAGNOSIS — F419 Anxiety disorder, unspecified: Secondary | ICD-10-CM

## 2020-02-28 NOTE — Telephone Encounter (Signed)
Requested Prescriptions  Pending Prescriptions Disp Refills  . traZODone (DESYREL) 50 MG tablet [Pharmacy Med Name: TRAZODONE 50MG  TABLETS] 90 tablet 0    Sig: TAKE 1/2 TO 1 TABLET(25 TO 50 MG) BY MOUTH AT BEDTIME AS NEEDED FOR SLEEP     Psychiatry: Antidepressants - Serotonin Modulator Passed - 02/28/2020 10:04 AM      Passed - Valid encounter within last 6 months    Recent Outpatient Visits          1 month ago Acute non-recurrent maxillary sinusitis   [place] [facility], Kelby Aline, FNP   3 months ago Shortness of breath   [name], Kelby Aline, FNP   4 months ago Right upper quadrant abdominal pain   [facility], Kelby Aline, FNP      Future Appointments            In 5 days Fl[facility], Kelby Aline, [place], [facility]           . hydrOXYzine (ATARAX/VISTARIL) 50 MG tablet [Pharmacy Med Name: HYDROXYZINE HCL 50MG  TABS (WHITE)] 90 tablet 0    Sig: TAKE ONE TABLET BY MOUTH EVERY 8 HOURS AS NEEDED FOR ITCHING, WILL CAUSE DROWSINESS     Ear, Nose, and Throat:  Antihistamines Passed - 02/28/2020 10:04 AM      Passed - Valid encounter within last 12 months    Recent Outpatient Visits          1 month ago Acute non-recurrent maxillary sinusitis   [place], FNP   3 months ago Shortness of breath   [name], Kelby Aline, FNP   4 months ago Right upper quadrant abdominal pain   [facility], Kelby Aline, FNP      Future Appointments            In 5 days Flinchum, Kelby Aline, [place], [place]

## 2020-02-29 DIAGNOSIS — M9904 Segmental and somatic dysfunction of sacral region: Secondary | ICD-10-CM | POA: Diagnosis not present

## 2020-02-29 DIAGNOSIS — M5417 Radiculopathy, lumbosacral region: Secondary | ICD-10-CM | POA: Diagnosis not present

## 2020-02-29 DIAGNOSIS — M9908 Segmental and somatic dysfunction of rib cage: Secondary | ICD-10-CM | POA: Diagnosis not present

## 2020-02-29 DIAGNOSIS — R0782 Intercostal pain: Secondary | ICD-10-CM | POA: Diagnosis not present

## 2020-02-29 DIAGNOSIS — M545 Low back pain: Secondary | ICD-10-CM | POA: Diagnosis not present

## 2020-02-29 DIAGNOSIS — M9903 Segmental and somatic dysfunction of lumbar region: Secondary | ICD-10-CM | POA: Diagnosis not present

## 2020-03-03 ENCOUNTER — Other Ambulatory Visit: Payer: Self-pay | Admitting: Adult Health

## 2020-03-03 DIAGNOSIS — D508 Other iron deficiency anemias: Secondary | ICD-10-CM

## 2020-03-03 DIAGNOSIS — E559 Vitamin D deficiency, unspecified: Secondary | ICD-10-CM

## 2020-03-03 DIAGNOSIS — Z1152 Encounter for screening for COVID-19: Secondary | ICD-10-CM

## 2020-03-03 DIAGNOSIS — G4733 Obstructive sleep apnea (adult) (pediatric): Secondary | ICD-10-CM

## 2020-03-03 NOTE — Progress Notes (Signed)
MyChart Video Visit    Virtual Visit via Video Note   This visit type was conducted due to national recommendations for restrictions regarding the COVID-19 Pandemic (e.g. social distancing) in an effort to limit this patient's exposure and mitigate transmission in our community. This patient is at least at moderate risk for complications without adequate follow up. This format is felt to be most appropriate for this patient at this time. Physical exam was limited by quality of the video and audio technology used for the visit.   Patient location: at  Home  Provider location: Provider: Provider's office at  [facility], [place].      I discussed the limitations of evaluation and management by telemedicine and the availability of in person appointments. The patient expressed understanding and agreed to proceed.  Patient: [name]   DOB: May 01, 1986   34 y.o. Female  MRN: [id number] Visit Date: 03/04/2020  Today's healthcare provider: Marcille Buffy, FNP   Chief Complaint  Patient presents with  . Insomnia   Subjective    HPI  Follow up for Insomnia  The patient was last seen for this 3 months ago. Changes made at last visit include patient was started on Trazodone 25mg .She reports she is feeling much better. She still uses Ativan PRN, declines antidepressant as does not like the way they make her feel.   She reports good compliance with treatment.As needed She feels that condition is mild improvement. She is using Trazodone some niights and othet nights taking Benadryl and not taking together. . She is not having side effects.  Patient reports that she has been using a cpap machine as well to help with sleep and states that she has noticed gradual improvement. She has had facial pain sinus pain on right side of her face.  Denies any sinus congestion.  She has not been wearing her sleep machine as much as before due to this sinus pressure/  pain.   She also has poison oak on both legs, left shin and right calf. Present for 3 days. Erythema and vesicles present.  redness. Itches. Has been working outside. Present over 4 days spreading.   Needs refills on medications for chronic migraines, menstrual cramps, asthma, anxiety.  Doing well with inhalers, no wheezing since starting - feels is much more controlled now at present. Dr. Patsey Berthold pulmonary reviewed patients CT scan of chest and in agreement with plan.   Requests 90 day supply on medication refills for insurance.   Patient  denies any fever, body aches,chills, rash, chest pain, shortness of breath, nausea, vomiting, or diarrhea.  Denies dizziness, lightheadedness, pre syncopal or syncopal episodes.   -----------------------------------------------------------------------------------------   Patient Active Problem List   Diagnosis Date Noted  . Fluid retention 03/04/2020  . Menstrual migraine without status migrainosus, not intractable 03/04/2020  . Local skin infection 03/04/2020  . Poison oak dermatitis 03/04/2020  . History of iron deficiency 12/27/2019  . Obstructive sleep apnea syndrome 11/21/2019  . History of claustrophobia 11/21/2019  . Loud snoring 11/14/2019  . Elevated alanine aminotransferase (ALT) level 11/14/2019  . Enlarged ovary - seen on CT 2021 11/14/2019  . Other ovarian cyst, right side 11/14/2019  . S/P removal of left ovary 11/14/2019  . Asthma, allergic, moderate persistent, uncomplicated 63/33/5456  . Shortness of breath 11/05/2019  . Exposure to parasitic disease 11/02/2019  . Abnormal result on screening urine test 11/02/2019  . Right upper quadrant abdominal pain 10/31/2019  . At high  risk for tick borne illness 10/31/2019  . Exposure to mold 10/18/2019  . Wheezing 10/18/2019  . Mass of breast, right- noted since December 2019  10/12/2019  . Intercostal pain 10/12/2019  . Screening for blood or protein in urine 10/12/2019  . Insomnia  10/12/2019  . Abdominal distension (gaseous) 10/12/2019  . History of iron deficiency anemia 10/12/2019  . Excessive vitamin B12 intake 10/12/2019  . Abnormal CT scan, pelvis 10/12/2019  . Gastroesophageal reflux disease 10/12/2019  . Nausea and vomiting 06/26/2019  . Tick bite-possible lives [place] farm 11/22/2018  . Allergy to bee sting 12/19/2017  . Thrombocytosis ([place]) 12/19/2017  . Family history of aortic aneurysm 03/28/2017  . Anxiety 07/07/2016  . Cubital tunnel syndrome on right 06/24/2015  . De Quervain's disease (radial styloid tenosynovitis) 06/24/2015  . Disorder of left external ear 06/06/2015  . Absolute anemia 01/06/2015  . Vitamin D deficiency 01/06/2015  . Pain in left ankle and joints of left foot 01/06/2015  . 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 Dyspepsia 09/17/2014  . Epigastric pain 09/17/2014  . Tarsal tunnel syndrome of right side 07/30/2014  . Abdominal pain 12/05/2012   History reviewed. No pertinent past medical history. Past Surgical History:  Procedure Laterality Date  . OVARY SURGERY    . URETHRA SURGERY    . WISDOM TOOTH EXTRACTION     Social History   Tobacco Use  . Smoking status: Never Smoker  . Smokeless tobacco: Never Used  Vaping Use  . Vaping Use: Never used  Substance Use Topics  . Alcohol use: No  . Drug use: No   Social History   Socioeconomic History  . Marital status: Married    Spouse name: Not on file  . Number of children: Not on file  . Years of education: Not on file  . Highest education level: Not on file  Occupational History  . Not on file  Tobacco Use  . Smoking status: Never Smoker  . Smokeless tobacco: Never Used  Vaping Use  . Vaping Use: Never used  Substance and Sexual Activity  . Alcohol use: No  . Drug use: No  . Sexual activity: Not on file  Other Topics Concern  . Not on file  Social History Narrative  . Not on file   Social Determinants of Health   Financial Resource Strain:   . Difficulty of  Paying Living Expenses: Not on file  Food Insecurity:   . Worried About Charity fundraiser in the Last Year: Not on file  . Ran Out of Food in the Last Year: Not on file  Transportation Needs:   . Lack of Transportation (Medical): Not on file  . Lack of Transportation (Non-Medical): Not on file  Physical Activity:   . Days of Exercise per Week: Not on file  . Minutes of Exercise per Session: Not on file  Stress:   . Feeling of Stress : Not on file  Social Connections:   . Frequency of Communication with Friends and Family: Not on file  . Frequency of Social Gatherings with Friends and Family: Not on file  . Attends Religious Services: Not on file  . Active Member of Clubs or Organizations: Not on file  . Attends Archivist Meetings: Not on file  . Marital Status: Not on file  Intimate Partner Violence:   . Fear of Current or Ex-Partner: Not on file  . Emotionally Abused: Not on file  . Physically Abused: Not on file  .  Sexually Abused: Not on file   No family status information on file.   History reviewed. No pertinent family history. Allergies  Allergen Reactions  . Benzonatate Shortness Of Breath  . Cephalexin Anaphylaxis and Shortness Of Breath  . Codeine Swelling and Other (See Comments)    Other reaction Other reaction   . Hornet Venom Anaphylaxis  . Prednisone Anaphylaxis and Shortness Of Breath    Received Celestone injection in past per Dr. Jarold Song   . Pseudoephedrine Other (See Comments)    CNS Disorder CNS Disorder   . Pseudoephedrine Hcl Other (See Comments)    No allergic, per pt  . Hydrocodone-Acetaminophen Swelling  . Nitrofurantoin Rash    Ulcers on tongue  . Oxycodone-Acetaminophen Swelling  . Percocet [Oxycodone-Acetaminophen] Swelling  . Vicodin [Hydrocodone-Acetaminophen] Swelling      Medications: Outpatient Medications Prior to Visit  Medication Sig  . ascorbic acid (VITAMIN C) 500 MG tablet Take by mouth.  . Cholecalciferol 125  MCG (5000 UT) capsule Take by mouth.  . cyanocobalamin 1000 MCG tablet Take by mouth.  . cyclobenzaprine (FLEXERIL) 10 MG tablet Take 1 tablet (10 mg total) by mouth at bedtime as needed for muscle spasms. Do not drive or operate machinery/ no legal decision may cause drowsiness.  Marland Kitchen EPINEPHrine 0.3 mg/0.3 mL IJ SOAJ injection Inject into the muscle.  . ferrous sulfate (FERROUSUL) 325 (65 FE) MG tablet Take 1 tablet (325 mg total) by mouth every other day.  . Vitamin D, Ergocalciferol, (DRISDOL) 1.25 MG (50000 UNIT) CAPS capsule Take 1 capsule (50,000 Units total) by mouth every 7 (seven) days. Only taking ONCE weekly as above NOT everyday.  . [DISCONTINUED] hydrochlorothiazide (HYDRODIURIL) 25 MG tablet TAKE 1/2 TO 1 TABLET BY MOUTH DAILY AS NEEDED FOR FLUID RETENTION  . [DISCONTINUED] hydrOXYzine (ATARAX/VISTARIL) 50 MG tablet TAKE ONE TABLET BY MOUTH EVERY 8 HOURS AS NEEDED FOR ITCHING, WILL CAUSE DROWSINESS  . [DISCONTINUED] ketorolac (TORADOL) 10 MG tablet Take 1 tablet (10 mg total) by mouth daily as needed for severe pain.  . [DISCONTINUED] LORazepam (ATIVAN) 0.5 MG tablet Take 1 tablet (0.5 mg total) by mouth 2 (two) times daily as needed for anxiety.  . [DISCONTINUED] montelukast (SINGULAIR) 10 MG tablet TAKE 1 TABLET(10 MG) BY MOUTH DAILY  . [DISCONTINUED] pantoprazole (PROTONIX) 40 MG tablet Take 40 mg by mouth 2 (two) times daily.  . [DISCONTINUED] SUMAtriptan (IMITREX) 50 MG tablet TAKE 1 TABLET BY MOUTH AS NEEDED AT ONSET OF SYMPTOMS. MAY REPEAT IN 2 HOURS IF NEEDED. NO MORE THAN 3 DOSES IN 24 HOURS  . [DISCONTINUED] SYMBICORT 80-4.5 MCG/ACT inhaler Inhale 2 puffs into the lungs 2 (two) times daily.  . [DISCONTINUED] Tiotropium Bromide Monohydrate (SPIRIVA RESPIMAT) 1.25 MCG/ACT AERS Inhale 2 puffs into the lungs daily.  . [DISCONTINUED] traZODone (DESYREL) 50 MG tablet TAKE 1/2 TO 1 TABLET(25 TO 50 MG) BY MOUTH AT BEDTIME AS NEEDED FOR SLEEP  . [DISCONTINUED] albuterol (VENTOLIN HFA)  108 (90 Base) MCG/ACT inhaler SMARTSIG:2 Puff(s) By Mouth Every 6 Hours PRN (Patient not taking: Reported on 03/04/2020)  . [DISCONTINUED] betamethasone dipropionate 0.05 % cream Apply topically 2 (two) times daily. Apply sparingly to area of concern (Patient not taking: Reported on 03/04/2020)  . [DISCONTINUED] doxycycline (VIBRA-TABS) 100 MG tablet Take 1 tablet (100 mg total) by mouth 2 (two) times daily.  . [DISCONTINUED] methylPREDNISolone (MEDROL DOSEPAK) 4 MG TBPK tablet PO: Take 6 tablets on day 1:Take 5 tablets day 2:Take 4 tablets day 3: Take 3 tablets day 4:Take  2 tablets day five: 5 Take 1 tablet day 6  . [DISCONTINUED] ondansetron (ZOFRAN-ODT) 8 MG disintegrating tablet Take 1 tablet (8 mg total) by mouth every 8 (eight) hours as needed for nausea or vomiting (only recomneded as needed seek follow up if persisitent symptoms). (Patient not taking: Reported on 03/04/2020)   No facility-administered medications prior to visit.    Review of Systems     Objective    There were no vitals taken for this visit. BP Readings from Last 3 Encounters:  05/16/12 115/79   SpO2 Readings from Last 3 Encounters:  05/16/12 100%  vitals reported normal per patient she checks at home. Prefers virtual only due to covid pandemic.     Physical Exam   Patient is alert and oriented and responsive to questions Engages in eye contact with provider. Speaks in full sentences without any pauses without any shortness of breath or distress. Video visit only.     Assessment & Plan     The primary encounter diagnosis was Insomnia, unspecified type. Diagnoses of Anxiety, Poison oak dermatitis, Local skin infection, Other iron deficiency anemia, Asthma, allergic, moderate persistent, uncomplicated, Obstructive sleep apnea hypopnea, moderate, Gastroesophageal reflux disease, unspecified whether esophagitis present, Menstrual migraine without status migrainosus, not intractable, Fluid retention, and Obstructive  sleep apnea syndrome were also pertinent to this visit.  Return in about 4 months (around 07/04/2020), or if symptoms worsen or fail to improve, for at any time for any worsening symptoms, Go to Emergency room/ urgent care if worse.     I discussed the assessment and treatment plan with the patient. The patient was provided an opportunity to ask questions and all were answered. The patient agreed with the plan and demonstrated an understanding of the instructions.   The patient was advised to call back or seek an in-person evaluation if the symptoms worsen or if the condition fails to improve as anticipated.  I provided  30 minutes of non-face-to-face time during this encounter.    Marcille Buffy, [place] [phone] (phone) [phone] (fax)  [place]

## 2020-03-04 ENCOUNTER — Encounter: Payer: Self-pay | Admitting: Adult Health

## 2020-03-04 ENCOUNTER — Telehealth (INDEPENDENT_AMBULATORY_CARE_PROVIDER_SITE_OTHER): Payer: BC Managed Care – PPO | Admitting: Adult Health

## 2020-03-04 DIAGNOSIS — K219 Gastro-esophageal reflux disease without esophagitis: Secondary | ICD-10-CM

## 2020-03-04 DIAGNOSIS — G4700 Insomnia, unspecified: Secondary | ICD-10-CM

## 2020-03-04 DIAGNOSIS — M9908 Segmental and somatic dysfunction of rib cage: Secondary | ICD-10-CM | POA: Diagnosis not present

## 2020-03-04 DIAGNOSIS — M9904 Segmental and somatic dysfunction of sacral region: Secondary | ICD-10-CM | POA: Diagnosis not present

## 2020-03-04 DIAGNOSIS — G4733 Obstructive sleep apnea (adult) (pediatric): Secondary | ICD-10-CM

## 2020-03-04 DIAGNOSIS — L089 Local infection of the skin and subcutaneous tissue, unspecified: Secondary | ICD-10-CM | POA: Diagnosis not present

## 2020-03-04 DIAGNOSIS — M545 Low back pain: Secondary | ICD-10-CM | POA: Diagnosis not present

## 2020-03-04 DIAGNOSIS — M5417 Radiculopathy, lumbosacral region: Secondary | ICD-10-CM | POA: Diagnosis not present

## 2020-03-04 DIAGNOSIS — D508 Other iron deficiency anemias: Secondary | ICD-10-CM

## 2020-03-04 DIAGNOSIS — R0782 Intercostal pain: Secondary | ICD-10-CM | POA: Diagnosis not present

## 2020-03-04 DIAGNOSIS — M9903 Segmental and somatic dysfunction of lumbar region: Secondary | ICD-10-CM | POA: Diagnosis not present

## 2020-03-04 DIAGNOSIS — L237 Allergic contact dermatitis due to plants, except food: Secondary | ICD-10-CM | POA: Diagnosis not present

## 2020-03-04 DIAGNOSIS — F419 Anxiety disorder, unspecified: Secondary | ICD-10-CM | POA: Diagnosis not present

## 2020-03-04 DIAGNOSIS — G43829 Menstrual migraine, not intractable, without status migrainosus: Secondary | ICD-10-CM | POA: Insufficient documentation

## 2020-03-04 DIAGNOSIS — J4540 Moderate persistent asthma, uncomplicated: Secondary | ICD-10-CM

## 2020-03-04 DIAGNOSIS — R609 Edema, unspecified: Secondary | ICD-10-CM

## 2020-03-04 MED ORDER — KETOROLAC TROMETHAMINE 10 MG PO TABS: 10.0000 mg | ORAL_TABLET | Freq: Every day | ORAL | 0 refills | Status: DC | PRN

## 2020-03-04 MED ORDER — HYDROXYZINE HCL 50 MG PO TABS: 50.0000 mg | ORAL_TABLET | Freq: Three times a day (TID) | ORAL | 0 refills | Status: DC | PRN

## 2020-03-04 MED ORDER — ONDANSETRON 8 MG PO TBDP: 4.0000 mg | ORAL_TABLET | Freq: Three times a day (TID) | ORAL | 0 refills | Status: DC | PRN

## 2020-03-04 MED ORDER — TRAZODONE HCL 50 MG PO TABS: ORAL_TABLET | ORAL | 0 refills | Status: DC

## 2020-03-04 MED ORDER — SYMBICORT 80-4.5 MCG/ACT IN AERO: 2.0000 | INHALATION_SPRAY | Freq: Two times a day (BID) | RESPIRATORY_TRACT | 6 refills | Status: DC

## 2020-03-04 MED ORDER — HYDROCHLOROTHIAZIDE 25 MG PO TABS: 12.5000 mg | ORAL_TABLET | Freq: Every day | ORAL | 1 refills | Status: DC | PRN

## 2020-03-04 MED ORDER — SUMATRIPTAN SUCCINATE 50 MG PO TABS: ORAL_TABLET | ORAL | 1 refills | Status: DC

## 2020-03-04 MED ORDER — DOXYCYCLINE HYCLATE 100 MG PO TABS: 100.0000 mg | ORAL_TABLET | Freq: Two times a day (BID) | ORAL | 0 refills | Status: DC

## 2020-03-04 MED ORDER — SPIRIVA RESPIMAT 1.25 MCG/ACT IN AERS: 2.0000 | INHALATION_SPRAY | Freq: Every day | RESPIRATORY_TRACT | 3 refills | Status: DC

## 2020-03-04 MED ORDER — PANTOPRAZOLE SODIUM 40 MG PO TBEC
40.0000 mg | DELAYED_RELEASE_TABLET | Freq: Two times a day (BID) | ORAL | 1 refills | Status: DC
Start: 2020-03-04 — End: 2020-04-10

## 2020-03-04 MED ORDER — METHYLPREDNISOLONE 4 MG PO TBPK: ORAL_TABLET | ORAL | 0 refills | Status: DC

## 2020-03-04 MED ORDER — LORAZEPAM 0.5 MG PO TABS
0.5000 mg | ORAL_TABLET | Freq: Two times a day (BID) | ORAL | 0 refills | Status: DC | PRN
Start: 2020-03-04 — End: 2020-04-30

## 2020-03-04 MED ORDER — BETAMETHASONE DIPROPIONATE 0.05 % EX CREA: TOPICAL_CREAM | Freq: Two times a day (BID) | CUTANEOUS | 1 refills | Status: DC

## 2020-03-04 NOTE — Patient Instructions (Signed)
Migraine Headache A migraine headache is a very strong throbbing pain on one side or both sides of your head. This type of headache can also cause other symptoms. It can last from 4 hours to 3 days. Talk with your doctor about what things may bring on (trigger) this condition. What are the causes? The exact cause of this condition is not known. This condition may be triggered or caused by:  Drinking alcohol.  Smoking.  Taking medicines, such as: ? Medicine used to treat chest pain (nitroglycerin). ? Birth control pills. ? Estrogen. ? Some blood pressure medicines.  Eating or drinking certain products.  Doing physical activity. Other things that may trigger a migraine headache include:  Having a menstrual period.  Pregnancy.  Hunger.  Stress.  Not getting enough sleep or getting too much sleep.  Weather changes.  Tiredness (fatigue). What increases the risk?  Being 25-55 years old.  Being female.  Having a family history of migraine headaches.  Being Caucasian.  Having depression or anxiety.  Being very overweight. What are the signs or symptoms?  A throbbing pain. This pain may: ? Happen in any area of the head, such as on one side or both sides. ? Make it hard to do daily activities. ? Get worse with physical activity. ? Get worse around bright lights or loud noises.  Other symptoms may include: ? Feeling sick to your stomach (nauseous). ? Vomiting. ? Dizziness. ? Being sensitive to bright lights, loud noises, or smells.  Before you get a migraine headache, you may get warning signs (an aura). An aura may include: ? Seeing flashing lights or having blind spots. ? Seeing bright spots, halos, or zigzag lines. ? Having tunnel vision or blurred vision. ? Having numbness or a tingling feeling. ? Having trouble talking. ? Having weak muscles.  Some people have symptoms after a migraine headache (postdromal phase), such as: ? Tiredness. ? Trouble  thinking (concentrating). How is this treated?  Taking medicines that: ? Relieve pain. ? Relieve the feeling of being sick to your stomach. ? Prevent migraine headaches.  Treatment may also include: ? Having acupuncture. ? Avoiding foods that bring on migraine headaches. ? Learning ways to control your body functions (biofeedback). ? Therapy to help you know and deal with negative thoughts (cognitive behavioral therapy). Follow these instructions at home: Medicines  Take over-the-counter and prescription medicines only as told by your doctor.  Ask your doctor if the medicine prescribed to you: ? Requires you to avoid driving or using heavy machinery. ? Can cause trouble pooping (constipation). You may need to take these steps to prevent or treat trouble pooping:  Drink enough fluid to keep your pee (urine) pale yellow.  Take over-the-counter or prescription medicines.  Eat foods that are high in fiber. These include beans, whole grains, and fresh fruits and vegetables.  Limit foods that are high in fat and sugar. These include fried or sweet foods. Lifestyle  Do not drink alcohol.  Do not use any products that contain nicotine or tobacco, such as cigarettes, e-cigarettes, and chewing tobacco. If you need help quitting, ask your doctor.  Get at least 8 hours of sleep every night.  Limit and deal with stress. General instructions      Keep a journal to find out what may bring on your migraine headaches. For example, write down: ? What you eat and drink. ? How much sleep you get. ? Any change in what you eat or drink. ? Any   change in your medicines.  If you have a migraine headache: ? Avoid things that make your symptoms worse, such as bright lights. ? It may help to lie down in a dark, quiet room. ? Do not drive or use heavy machinery. ? Ask your doctor what activities are safe for you.  Keep all follow-up visits as told by your doctor. This is important. Contact  a doctor if:  You get a migraine headache that is different or worse than others you have had.  You have more than 15 headache days in one month. Get help right away if:  Your migraine headache gets very bad.  Your migraine headache lasts longer than 72 hours.  You have a fever.  You have a stiff neck.  You have trouble seeing.  Your muscles feel weak or like you cannot control them.  You start to lose your balance a lot.  You start to have trouble walking.  You pass out (faint).  You have a seizure. Summary  A migraine headache is a very strong throbbing pain on one side or both sides of your head. These headaches can also cause other symptoms.  This condition may be treated with medicines and changes to your lifestyle.  Keep a journal to find out what may bring on your migraine headaches.  Contact a doctor if you get a migraine headache that is different or worse than others you have had.  Contact your doctor if you have more than 15 headache days in a month. This information is not intended to replace advice given to you by your health care provider. Make sure you discuss any questions you have with your health care provider. Document Revised: 09/22/2018 Document Reviewed: 07/13/2018 Elsevier Patient Education  [place]. Asthma, Adult  Asthma is a long-term (chronic) condition in which the airways get tight and narrow. The airways are the breathing passages that lead from the nose and mouth down into the lungs. A person with asthma will have times when symptoms get worse. These are called asthma attacks. They can cause coughing, whistling sounds when you breathe (wheezing), shortness of breath, and chest pain. They can make it hard to breathe. There is no cure for asthma, but medicines and lifestyle changes can help control it. There are many things that can bring on an asthma attack or make asthma symptoms worse (triggers). Common triggers  include:  Mold.  Dust.  Cigarette smoke.  Cockroaches.  Things that can cause allergy symptoms (allergens). These include animal skin flakes (dander) and pollen from trees or grass.  Things that pollute the air. These may include household cleaners, wood smoke, smog, or chemical odors.  Cold air, weather changes, and wind.  Crying or laughing hard.  Stress.  Certain medicines or drugs.  Certain foods such as dried fruit, potato chips, and grape juice.  Infections, such as a cold or the flu.  Certain medical conditions or diseases.  Exercise or tiring activities. Asthma may be treated with medicines and by staying away from the things that cause asthma attacks. Types of medicines may include:  Controller medicines. These help prevent asthma symptoms. They are usually taken every day.  Fast-acting reliever or rescue medicines. These quickly relieve asthma symptoms. They are used as needed and provide short-term relief.  Allergy medicines if your attacks are brought on by allergens.  Medicines to help control the body's defense (immune) system. Follow these instructions at home: Avoiding triggers in your home  Change your heating  and air conditioning filter often.  Limit your use of fireplaces and wood stoves.  Get rid of pests (such as roaches and mice) and their droppings.  Throw away plants if you see mold on them.  Clean your floors. Dust regularly. Use cleaning products that do not smell.  Have someone vacuum when you are not home. Use a vacuum cleaner with a HEPA filter if possible.  Replace carpet with wood, tile, or vinyl flooring. Carpet can trap animal skin flakes and dust.  Use allergy-proof pillows, mattress covers, and box spring covers.  Wash bed sheets and blankets every week in hot water. Dry them in a dryer.  Keep your bedroom free of any triggers.  Avoid pets and keep windows closed when things that cause allergy symptoms are in the  air.  Use blankets that are made of polyester or cotton.  Clean bathrooms and kitchens with bleach. If possible, have someone repaint the walls in these rooms with mold-resistant paint. Keep out of the rooms that are being cleaned and painted.  Wash your hands often with soap and water. If soap and water are not available, use hand sanitizer.  Do not allow anyone to smoke in your home. General instructions  Take over-the-counter and prescription medicines only as told by your doctor. ? Talk with your doctor if you have questions about how or when to take your medicines. ? Make note if you need to use your medicines more often than usual.  Do not use any products that contain nicotine or tobacco, such as cigarettes and e-cigarettes. If you need help quitting, ask your doctor.  Stay away from secondhand smoke.  Avoid doing things outdoors when allergen counts are high and when air quality is low.  Wear a ski mask when doing outdoor activities in the winter. The mask should cover your nose and mouth. Exercise indoors on cold days if you can.  Warm up before you exercise. Take time to cool down after exercise.  Use a peak flow meter as told by your doctor. A peak flow meter is a tool that measures how well the lungs are working.  Keep track of the peak flow meter's readings. Write them down.  Follow your asthma action plan. This is a written plan for taking care of your asthma and treating your attacks.  Make sure you get all the shots (vaccines) that your doctor recommends. Ask your doctor about a flu shot and a pneumonia shot.  Keep all follow-up visits as told by your doctor. This is important. Contact a doctor if:  You have wheezing, shortness of breath, or a cough even while taking medicine to prevent attacks.  The mucus you cough up (sputum) is thicker than usual.  The mucus you cough up changes from clear or white to yellow, green, gray, or bloody.  You have problems from  the medicine you are taking, such as: ? A rash. ? Itching. ? Swelling. ? Trouble breathing.  You need reliever medicines more than 2-3 times a week.  Your peak flow reading is still at 50-79% of your personal best after following the action plan for 1 hour.  You have a fever. Get help right away if:  You seem to be worse and are not responding to medicine during an asthma attack.  You are short of breath even at rest.  You get short of breath when doing very little activity.  You have trouble eating, drinking, or talking.  You have chest pain or  tightness.  You have a fast heartbeat.  Your lips or fingernails start to turn blue.  You are light-headed or dizzy, or you faint.  Your peak flow is less than 50% of your personal best.  You feel too tired to breathe normally. Summary  Asthma is a long-term (chronic) condition in which the airways get tight and narrow. An asthma attack can make it hard to breathe.  Asthma cannot be cured, but medicines and lifestyle changes can help control it.  Make sure you understand how to avoid triggers and how and when to use your medicines. This information is not intended to replace advice given to you by your health care provider. Make sure you discuss any questions you have with your health care provider. Document Revised: 08/03/2018 Document Reviewed: 07/05/2016 Elsevier Patient Education  [place]. CPAP and BPAP Information CPAP and BPAP are methods of helping a person breathe with the use of air pressure. CPAP stands for "continuous positive airway pressure." BPAP stands for "bi-level positive airway pressure." In both methods, air is blown through your nose or mouth and into your air passages to help you breathe well. CPAP and BPAP use different amounts of pressure to blow air. With CPAP, the amount of pressure stays the same while you breathe in and out. With BPAP, the amount of pressure is increased when you breathe in  (inhale) so that you can take larger breaths. Your health care provider will recommend whether CPAP or BPAP would be more helpful for you. Why are CPAP and BPAP treatments used? CPAP or BPAP can be helpful if you have:  Sleep apnea.  Chronic obstructive pulmonary disease (COPD).  Heart failure.  Medical conditions that weaken the muscles of the chest including muscular dystrophy, or neurological diseases such as amyotrophic lateral sclerosis (ALS).  Other problems that cause breathing to be weak, abnormal, or difficult. CPAP is most commonly used for obstructive sleep apnea (OSA) to keep the airways from collapsing when the muscles relax during sleep. How is CPAP or BPAP administered? Both CPAP and BPAP are provided by a small machine with a flexible plastic tube that attaches to a plastic mask. You wear the mask. Air is blown through the mask into your nose or mouth. The amount of pressure that is used to blow the air can be adjusted on the machine. Your health care provider will determine the pressure setting that should be used based on your individual needs. When should CPAP or BPAP be used? In most cases, the mask only needs to be worn during sleep. Generally, the mask needs to be worn throughout the night and during any daytime naps. People with certain medical conditions may also need to wear the mask at other times when they are awake. Follow instructions from your health care provider about when to use the machine. What are some tips for using the mask?   Because the mask needs to be snug, some people feel trapped or closed-in (claustrophobic) when first using the mask. If you feel this way, you may need to get used to the mask. One way to do this is by holding the mask loosely over your nose or mouth and then gradually applying the mask more snugly. You can also gradually increase the amount of time that you use the mask.  Masks are available in various types and sizes. Some fit over  your mouth and nose while others fit over just your nose. If your mask does not fit well,  talk with your health care provider about getting a different one.  If you are using a mask that fits over your nose and you tend to breathe through your mouth, a chin strap may be applied to help keep your mouth closed.  The CPAP and BPAP machines have alarms that may sound if the mask comes off or develops a leak.  If you have trouble with the mask, it is very important that you talk with your health care provider about finding a way to make the mask easier to tolerate. Do not stop using the mask. Stopping the use of the mask could have a negative impact on your health. What are some tips for using the machine?  Place your CPAP or BPAP machine on a secure table or stand near an electrical outlet.  Know where the on/off switch is located on the machine.  Follow instructions from your health care provider about how to set the pressure on your machine and when you should use it.  Do not eat or drink while the CPAP or BPAP machine is on. Food or fluids could get pushed into your lungs by the pressure of the CPAP or BPAP.  Do not smoke. Tobacco smoke residue can damage the machine.  For home use, CPAP and BPAP machines can be rented or purchased through home health care companies. Many different brands of machines are available. Renting a machine before purchasing may help you find out which particular machine works well for you.  Keep the CPAP or BPAP machine and attachments clean. Ask your health care provider for specific instructions. Get help right away if:  You have redness or open areas around your nose or mouth where the mask fits.  You have trouble using the CPAP or BPAP machine.  You cannot tolerate wearing the CPAP or BPAP mask.  You have pain, discomfort, and bloating in your abdomen. Summary  CPAP and BPAP are methods of helping a person breathe with the use of air pressure.  Both  CPAP and BPAP are provided by a small machine with a flexible plastic tube that attaches to a plastic mask.  If you have trouble with the mask, it is very important that you talk with your health care provider about finding a way to make the mask easier to tolerate. This information is not intended to replace advice given to you by your health care provider. Make sure you discuss any questions you have with your health care provider. Document Revised: 09/20/2018 Document Reviewed: 04/19/2016 Elsevier Patient Education  [place]. Skin Yeast Infection  A skin yeast infection is a condition in which there is an overgrowth of yeast (candida) that normally lives on the skin. This condition usually occurs in areas of the skin that are constantly warm and moist, such as the armpits or the groin. What are the causes? This condition is caused by a change in the normal balance of the yeast and bacteria that live on the skin. What increases the risk? You are more likely to develop this condition if you:  Are obese.  Are pregnant.  Take birth control pills.  Have diabetes.  Take antibiotic medicines.  Take steroid medicines.  Are malnourished.  Have a weak body defense system (immune system).  Are 74 years of age or older.  Wear tight clothing. What are the signs or symptoms? The most common symptom of this condition is itchiness in the affected area. Other symptoms include:  Red, swollen area of  the skin.  Bumps on the skin. How is this diagnosed?  This condition is diagnosed with a medical history and physical exam.  Your health care provider may check for yeast by taking light scrapings of the skin to be viewed under a microscope. How is this treated? This condition is treated with medicine. Medicines may be prescribed or be available over the counter. The medicines may be:  Taken by mouth (orally).  Applied as a cream or powder to your skin. Follow these  instructions at home:   Take or apply over-the-counter and prescription medicines only as told by your health care provider.  Maintain a healthy weight. If you need help losing weight, talk with your health care provider.  Keep your skin clean and dry.  If you have diabetes, keep your blood sugar under control.  Keep all follow-up visits as told by your health care provider. This is important. Contact a health care provider if:  Your symptoms go away and then return.  Your symptoms do not get better with treatment.  Your symptoms get worse.  Your rash spreads.  You have a fever or chills.  You have new symptoms.  You have new warmth or redness of your skin. Summary  A skin yeast infection is a condition in which there is an overgrowth of yeast (candida) that normally lives on the skin. This condition is caused by a change in the normal balance of the yeast and bacteria that live on the skin.  Take or apply over-the-counter and prescription medicines only as told by your health care provider.  Keep your skin clean and dry.  Contact a health care provider if your symptoms do not get better with treatment. This information is not intended to replace advice given to you by your health care provider. Make sure you discuss any questions you have with your health care provider. Document Revised: 10/18/2017 Document Reviewed: 10/18/2017 Elsevier Patient Education  [place] Dermatitis  Poison oak dermatitis is redness and soreness (inflammation) of the skin caused by chemicals in the leaves of the poison oak plant. You may have very bad itching, swelling, a rash, and blisters. What are the causes? You may get this condition by:  Touching a poison oak plant.  Touching something that has the chemical from the leaves on it. This may include animals or objects that have come in contact with the plant. What increases the risk? You are more likely to get this  condition if you:  Go outdoors often in wooded or marshy areas.  Go outdoors without wearing protective clothing, such as closed shoes, long pants, and a long-sleeved shirt. What are the signs or symptoms? Symptoms of this condition include:  Redness of the skin.  Very bad itching.  A rash that often includes bumps and blisters. ? The rash usually appears 48 hours after exposure if you have been exposed before. ? If this is the first time you have been exposed, the rash may not appear until a week after exposure.  Swelling. This may occur if the reaction is very bad. Symptoms often clear up in 1-2 weeks. The first time you develop this condition, symptoms may last 3-4 weeks. How is this treated? This condition may be treated with:  Hydrocortisone creams or calamine lotions to help with itching.  Oatmeal baths to soothe the skin.  Medicines to help reduce itching (antihistamines). If you have a very bad reaction, you may also be given steroid medicines. Follow  these instructions at home: Medicines  Take or apply over-the-counter and prescription medicines only as told by your doctor.  Use hydrocortisone creams or calamine lotion as needed to help with itching. General instructions  Do not scratch or rub your skin.  Put a cold, wet cloth (cold compress) on the affected areas or take baths in cool water. This will help with itching.  Avoid hot baths and showers.  Take oatmeal baths as needed. Use colloidal oatmeal. You can get this at a pharmacy or grocery store. Follow the instructions on the package.  While you have the rash, wash your clothes right after you wear them.  Keep all follow-up visits as told by your doctor. This is important. How is this prevented?   Know what poison oak looks like so you can avoid it. ? This plant has three leaves with flowering branches on a single stem. ? The leaves are fuzzy. ? The edges of the leaves look like teeth.  If you have  touched poison oak, wash your skin with soap and water right away. Be sure to wash under your fingernails.  When hiking or camping, wear long pants, a long-sleeved shirt, tall socks, and hiking boots. You can also use a lotion on your skin that helps to prevent contact with the chemical on the plant.  If you think that your clothes or outdoor gear came in contact with poison oak, rinse them off with a garden hose before you bring them inside your house.  When doing yard work or gardening, wear gloves, long sleeves, long pants, and boots. Wash your garden tools and gloves if they come in contact with poison oak.  If you think that your pet has come into contact with poison oak, wash him or her with pet shampoo and water. Make sure you wear gloves while washing your pet.  Do not burn poison oak plants. This can release the chemical from the plant into the air and may cause a reaction. Contact a doctor if:  You have open sores in the rash area.  You have more redness, swelling, or pain in the affected area.  You have redness that spreads beyond the rash area.  You have fluid, blood, or pus coming from the affected area.  You have a fever.  You have a rash over a large area of your body.  You have a rash on your eyes, mouth, or genitals.  Your rash does not improve after a few weeks. Get help right away if:  Your face swells or your eyes swell shut.  You have trouble breathing.  You have trouble swallowing. These symptoms may be an emergency. Do not wait to see if the symptoms will go away. Get medical help right away. Call your local emergency services (911 in the [place] Do not drive yourself to the hospital. Summary  Poison oak dermatitis is redness and soreness of the skin caused by chemicals in the leaves of the poison oak plant.  Symptoms of this condition include redness, very bad itching, a rash, and swelling.  Do not scratch or rub your skin.  Take or apply  over-the-counter and prescription medicines only as told by your doctor. This information is not intended to replace advice given to you by your health care provider. Make sure you discuss any questions you have with your health care provider. Document Revised: 09/22/2018 Document Reviewed: 06/30/2018 Elsevier Patient Education  [place].

## 2020-03-06 DIAGNOSIS — D508 Other iron deficiency anemias: Secondary | ICD-10-CM | POA: Diagnosis not present

## 2020-03-06 DIAGNOSIS — G4733 Obstructive sleep apnea (adult) (pediatric): Secondary | ICD-10-CM | POA: Diagnosis not present

## 2020-03-06 LAB — RETICULOCYTES: Retic Ct Pct: 1.9 % (ref 0.6–2.6)

## 2020-03-07 ENCOUNTER — Other Ambulatory Visit: Payer: Self-pay | Admitting: Adult Health

## 2020-03-07 ENCOUNTER — Telehealth: Payer: Self-pay

## 2020-03-07 DIAGNOSIS — G4733 Obstructive sleep apnea (adult) (pediatric): Secondary | ICD-10-CM

## 2020-03-07 DIAGNOSIS — E559 Vitamin D deficiency, unspecified: Secondary | ICD-10-CM

## 2020-03-07 LAB — CBC WITH DIFFERENTIAL/PLATELET

## 2020-03-07 LAB — VITAMIN B12: Vitamin B-12: 817 pg/mL (ref 232–1245)

## 2020-03-07 LAB — SEDIMENTATION RATE

## 2020-03-07 LAB — C-REACTIVE PROTEIN: CRP: <1 mg/L (ref 0–10)

## 2020-03-07 LAB — THYROID PANEL WITH TSH
Free Thyroxine Index: 2 (ref 1.2–4.9)
T3 Uptake Ratio: 25 % (ref 24–39)
T4, Total: 7.8 ug/dL (ref 4.5–12.0)
TSH: 2.48 u[IU]/mL (ref 0.450–4.500)

## 2020-03-07 LAB — VITAMIN D 25 HYDROXY (VIT D DEFICIENCY, FRACTURES)

## 2020-03-07 LAB — IRON AND TIBC
Iron Saturation: 16 % (ref 15–55)
Iron: 53 ug/dL (ref 27–159)
Total Iron Binding Capacity: 326 ug/dL (ref 250–450)
UIBC: 273 ug/dL (ref 131–425)

## 2020-03-07 NOTE — Telephone Encounter (Signed)
lmtcb okay for [facility] triage nurse to advise patient of lab results. KW

## 2020-03-07 NOTE — Progress Notes (Signed)
Not enough blood to run the CBC and sed rate was canceled -  will need to have repeated to check platelets.   Thyroid TSH within normal limits. CRP within normal limits. B12 within normal limits ok to continue supplement currently on.   Iron is within normal limits now.   Vitamin D and CMP still pending.

## 2020-03-07 NOTE — Telephone Encounter (Signed)
-----   Message from Doreen Beam, [place] sent at 03/07/2020  8:05 AM EDT ----- Not enough blood to run the CBC and sed rate was canceled -  will need to have repeated to check platelets.   Thyroid TSH within normal limits. CRP within normal limits. B12 within normal limits ok to continue supplement currently on.   Iron is within normal limits now.   Vitamin D and CMP still pending.

## 2020-03-08 LAB — COMPREHENSIVE METABOLIC PANEL
ALT: 20 IU/L (ref 0–32)
AST: 17 IU/L (ref 0–40)
Albumin/Globulin Ratio: 2.5 — ABNORMAL HIGH (ref 1.2–2.2)
Albumin: 4.7 g/dL (ref 3.8–4.8)
Alkaline Phosphatase: 80 IU/L (ref 44–121)
BUN/Creatinine Ratio: 14 (ref 9–23)
BUN: 11 mg/dL (ref 6–20)
Bilirubin Total: <0.2 mg/dL (ref 0.0–1.2)
CO2: 17 mmol/L — ABNORMAL LOW (ref 20–29)
Calcium: 9.3 mg/dL (ref 8.7–10.2)
Chloride: 99 mmol/L (ref 96–106)
Creatinine, Ser: 0.78 mg/dL (ref 0.57–1.00)
GFR calc Af Amer: 116 mL/min/{1.73_m2} (ref >59)
GFR calc non Af Amer: 100 mL/min/{1.73_m2} (ref >59)
Globulin, Total: 1.9 g/dL (ref 1.5–4.5)
Glucose: 67 mg/dL (ref 65–99)
Potassium: 4 mmol/L (ref 3.5–5.2)
Sodium: 138 mmol/L (ref 134–144)
Total Protein: 6.6 g/dL (ref 6.0–8.5)

## 2020-03-08 LAB — SPECIMEN STATUS REPORT

## 2020-03-10 LAB — SAR COV2 SEROLOGY (COVID19)AB(IGG),IA

## 2020-03-11 DIAGNOSIS — G4733 Obstructive sleep apnea (adult) (pediatric): Secondary | ICD-10-CM | POA: Diagnosis not present

## 2020-03-11 DIAGNOSIS — E559 Vitamin D deficiency, unspecified: Secondary | ICD-10-CM | POA: Diagnosis not present

## 2020-03-12 LAB — CBC WITH DIFFERENTIAL/PLATELET
Basophils Absolute: 0.1 10*3/uL (ref 0.0–0.2)
Basos: 1 %
EOS (ABSOLUTE): 0.3 10*3/uL (ref 0.0–0.4)
Eos: 3 %
Hematocrit: 39.4 % (ref 34.0–46.6)
Hemoglobin: 12.9 g/dL (ref 11.1–15.9)
Immature Grans (Abs): 0 10*3/uL (ref 0.0–0.1)
Immature Granulocytes: 0 %
Lymphocytes Absolute: 2.8 10*3/uL (ref 0.7–3.1)
Lymphs: 27 %
MCH: 27.7 pg (ref 26.6–33.0)
MCHC: 32.7 g/dL (ref 31.5–35.7)
MCV: 85 fL (ref 79–97)
Monocytes Absolute: 0.7 10*3/uL (ref 0.1–0.9)
Monocytes: 7 %
Neutrophils Absolute: 6.6 10*3/uL (ref 1.4–7.0)
Neutrophils: 62 %
Platelets: 500 10*3/uL — ABNORMAL HIGH (ref 150–450)
RBC: 4.65 x10E6/uL (ref 3.77–5.28)
RDW: 12.7 % (ref 11.7–15.4)
WBC: 10.5 10*3/uL (ref 3.4–10.8)

## 2020-03-12 LAB — COMPREHENSIVE METABOLIC PANEL
ALT: 19 IU/L (ref 0–32)
AST: 19 IU/L (ref 0–40)
Albumin/Globulin Ratio: 1.9 (ref 1.2–2.2)
Albumin: 4.9 g/dL — ABNORMAL HIGH (ref 3.8–4.8)
Alkaline Phosphatase: 72 IU/L (ref 44–121)
BUN/Creatinine Ratio: 18 (ref 9–23)
BUN: 13 mg/dL (ref 6–20)
Bilirubin Total: 0.3 mg/dL (ref 0.0–1.2)
CO2: 21 mmol/L (ref 20–29)
Calcium: 9.8 mg/dL (ref 8.7–10.2)
Chloride: 101 mmol/L (ref 96–106)
Creatinine, Ser: 0.72 mg/dL (ref 0.57–1.00)
GFR calc Af Amer: 127 mL/min/{1.73_m2} (ref >59)
GFR calc non Af Amer: 110 mL/min/{1.73_m2} (ref >59)
Globulin, Total: 2.6 g/dL (ref 1.5–4.5)
Glucose: 100 mg/dL — ABNORMAL HIGH (ref 65–99)
Potassium: 3.8 mmol/L (ref 3.5–5.2)
Sodium: 139 mmol/L (ref 134–144)
Total Protein: 7.5 g/dL (ref 6.0–8.5)

## 2020-03-12 LAB — VITAMIN D 25 HYDROXY (VIT D DEFICIENCY, FRACTURES): Vit D, 25-Hydroxy: 41.1 ng/mL (ref 30.0–100.0)

## 2020-03-12 LAB — SAR COV2 SEROLOGY (COVID19)AB(IGG),IA: DiaSorin SARS-CoV-2 Ab, IgG: NEGATIVE

## 2020-03-12 LAB — SEDIMENTATION RATE: Sed Rate: 4 mm/hr (ref 0–32)

## 2020-03-14 DIAGNOSIS — G4733 Obstructive sleep apnea (adult) (pediatric): Secondary | ICD-10-CM | POA: Diagnosis not present

## 2020-03-17 NOTE — Telephone Encounter (Signed)
Seen by patient [name] on 03/12/2020  9:14 AM

## 2020-03-19 ENCOUNTER — Other Ambulatory Visit: Payer: Self-pay | Admitting: Adult Health

## 2020-03-19 ENCOUNTER — Encounter: Payer: Self-pay | Admitting: Adult Health

## 2020-03-19 DIAGNOSIS — L089 Local infection of the skin and subcutaneous tissue, unspecified: Secondary | ICD-10-CM

## 2020-03-19 DIAGNOSIS — L237 Allergic contact dermatitis due to plants, except food: Secondary | ICD-10-CM

## 2020-03-19 DIAGNOSIS — L700 Acne vulgaris: Secondary | ICD-10-CM

## 2020-03-19 NOTE — Telephone Encounter (Signed)
Requested medication (s) are due for refill today: yes  Requested medication (s) are on the active medication list:yes  Last refill: 12/31/19  #12  0 refills  Future visit scheduled no  Notes to clinic: not delegated.  Also requested (Via interface) refill of Doxycycline 100mg , prescribed 03/04/20  #20  0 refills. On current med profile  Requested Prescriptions  Pending Prescriptions Disp Refills   Vitamin D, Ergocalciferol, (DRISDOL) 1.25 MG (50000 UNIT) CAPS capsule [Pharmacy Med Name: VITAMIN D2 50,000IU (ERGO) CAP RX] 12 capsule 0    Sig: TAKE ONE CAPSULE BY MOUTH EVERY 7 DAYS      Endocrinology:  Vitamins - Vitamin D Supplementation Failed - 03/19/2020 10:40 AM      Failed - 50,000 IU strengths are not delegated      Failed - Phosphate in normal range and within 360 days    No results found for: PHOS        Passed - Ca in normal range and within 360 days    Calcium  Date Value Ref Range Status  03/11/2020 9.8 8.7 - 10.2 mg/dL Final          Passed - Vitamin D in normal range and within 360 days    Vit D, 25-Hydroxy  Date Value Ref Range Status  03/11/2020 41.1 30.0 - 100.0 ng/mL Final    Comment:    Vitamin D deficiency has been defined by the Institute of Medicine and an Endocrine Society practice guideline as a level of serum 25-OH vitamin D less than 20 ng/mL (1,2). The Endocrine Society went on to further define vitamin D insufficiency as a level between 21 and 29 ng/mL (2). 1. IOM (Institute of Medicine). 2010. Dietary reference    intakes for calcium and D. [place]: The    [employer]. 2. Holick MF, Binkley Esmond, Bischoff-Ferrari HA, et al.    Evaluation, treatment, and prevention of vitamin D    deficiency: an Endocrine Society clinical practice    guideline. JCEM. 2011 Jul; 96(7):1911-30.           Passed - Valid encounter within last 12 months    Recent Outpatient Visits           2 weeks ago Insomnia, unspecified type   [place], Kelby Aline, FNP   2 months ago Acute non-recurrent maxillary sinusitis   [facility], Kelby Aline, FNP   4 months ago Shortness of breath   [name], Kelby Aline, FNP   5 months ago Right upper quadrant abdominal pain   [place], FNP                doxycycline (VIBRA-TABS) 100 MG tablet [Pharmacy Med Name: DOXYCYCLINE HYC 100MG  TABS] 20 tablet 0    Sig: TAKE 1 TABLET(100 MG) BY MOUTH TWICE DAILY      Off-Protocol Failed - 03/19/2020 10:40 AM      Failed - Medication not assigned to a protocol, review manually.      Passed - Valid encounter within last 12 months    Recent Outpatient Visits           2 weeks ago Insomnia, unspecified type   [place], Kelby Aline, FNP   2 months ago Acute non-recurrent maxillary sinusitis   [place], FNP   4 months ago Shortness of breath   [name], Kelby Aline, FNP  5 months ago Right upper quadrant abdominal pain   [place], [place]

## 2020-03-26 DIAGNOSIS — R0782 Intercostal pain: Secondary | ICD-10-CM | POA: Diagnosis not present

## 2020-03-26 DIAGNOSIS — M5417 Radiculopathy, lumbosacral region: Secondary | ICD-10-CM | POA: Diagnosis not present

## 2020-03-26 DIAGNOSIS — M9904 Segmental and somatic dysfunction of sacral region: Secondary | ICD-10-CM | POA: Diagnosis not present

## 2020-03-26 DIAGNOSIS — M9908 Segmental and somatic dysfunction of rib cage: Secondary | ICD-10-CM | POA: Diagnosis not present

## 2020-03-26 DIAGNOSIS — M9903 Segmental and somatic dysfunction of lumbar region: Secondary | ICD-10-CM | POA: Diagnosis not present

## 2020-03-26 DIAGNOSIS — M5451 Vertebrogenic low back pain: Secondary | ICD-10-CM | POA: Diagnosis not present

## 2020-04-01 ENCOUNTER — Ambulatory Visit
Admission: RE | Admit: 2020-04-01 | Discharge: 2020-04-01 | Disposition: A | Discharge status: Home / Self Care | Payer: BC Managed Care – PPO | Source: Ambulatory Visit | Attending: Adult Health | Admitting: Adult Health

## 2020-04-01 ENCOUNTER — Telehealth (INDEPENDENT_AMBULATORY_CARE_PROVIDER_SITE_OTHER): Payer: BC Managed Care – PPO | Admitting: Adult Health

## 2020-04-01 ENCOUNTER — Other Ambulatory Visit: Payer: Self-pay

## 2020-04-01 ENCOUNTER — Encounter: Payer: Self-pay | Admitting: Adult Health

## 2020-04-01 ENCOUNTER — Telehealth: Payer: Self-pay

## 2020-04-01 DIAGNOSIS — G44009 Cluster headache syndrome, unspecified, not intractable: Secondary | ICD-10-CM | POA: Diagnosis not present

## 2020-04-01 DIAGNOSIS — R102 Pelvic and perineal pain unspecified side: Secondary | ICD-10-CM | POA: Insufficient documentation

## 2020-04-01 DIAGNOSIS — R509 Fever, unspecified: Secondary | ICD-10-CM

## 2020-04-01 DIAGNOSIS — R4781 Slurred speech: Secondary | ICD-10-CM | POA: Diagnosis not present

## 2020-04-01 DIAGNOSIS — N926 Irregular menstruation, unspecified: Secondary | ICD-10-CM | POA: Diagnosis not present

## 2020-04-01 DIAGNOSIS — R7989 Other specified abnormal findings of blood chemistry: Secondary | ICD-10-CM | POA: Diagnosis not present

## 2020-04-01 DIAGNOSIS — R55 Syncope and collapse: Secondary | ICD-10-CM | POA: Insufficient documentation

## 2020-04-01 NOTE — Telephone Encounter (Signed)
Noted  

## 2020-04-01 NOTE — Telephone Encounter (Signed)
Copied from [place] [id number]. Topic: General - Inquiry >> Apr 01, 2020  5:22 PM Alease Frame wrote: Reason for CRM: Caryl Pina from [facility] ultra sound dept called in stating pts ultrasound was a normal pelvic ultrasound . No call back needed. Please advise

## 2020-04-01 NOTE — Patient Instructions (Signed)
Near-Syncope Near-syncope is when you suddenly get weak or dizzy, or you feel like you might pass out (faint). This may also be called presyncope. This is due to a lack of blood flow to the brain. During an episode of near-syncope, you may:  Feel dizzy, weak, or light-headed.  Feel sick to your stomach (nauseous).  See all white or all black.  See spots.  Have cold, clammy skin. This condition is caused by a sudden decrease in blood flow to the brain. This decrease can result from various causes, but most of those causes are not dangerous. However, near-syncope may be a sign of a serious medical problem, so it is important to seek medical care. Follow these instructions at home: Medicines  Take over-the-counter and prescription medicines only as told by your doctor.  If you are taking blood pressure or heart medicine, get up slowly and spend many minutes getting ready to sit and then stand. This can help with dizziness. General instructions  Be aware of any changes in your symptoms.  Talk with your doctor about your symptoms. You may need to have testing to find the cause of your near-syncope.  If you start to feel like you might pass out, lie down right away. Raise (elevate) your feet above the level of your heart. Breathe deeply and steadily. Wait until all of the symptoms are gone.  Have someone stay with you until you feel stable.  Do not drive, use machinery, or play sports until your doctor says it is okay.  Drink enough fluid to keep your pee (urine) pale yellow.  Keep all follow-up visits as told by your doctor. This is important. Get help right away if you:  Have a seizure.  Have pain in your: ? Chest. ? Belly (abdomen). ? Back.  Faint once or more than once.  Have a very bad headache.  Are bleeding from your mouth or butt.  Have black or tarry poop (stool).  Have a very fast or uneven heartbeat (palpitations).  Are mixed up (confused).  Have trouble  walking.  Are very weak.  Have trouble seeing. These symptoms may be an emergency. Do not wait to see if the symptoms will go away. Get medical help right away. Call your local emergency services (911 in the [place] Do not drive yourself to the hospital. Summary  Near-syncope is when you suddenly get weak or dizzy, or you feel like you might pass out (faint).  This condition is caused by a lack of blood flow to the brain.  Near-syncope may be a sign of a serious medical problem, so it is important to seek medical care. This information is not intended to replace advice given to you by your health care provider. Make sure you discuss any questions you have with your health care provider. Document Revised: 09/22/2018 Document Reviewed: 04/19/2018 Elsevier Patient Education  [place] Headache Without Cause A headache is pain or discomfort that is felt around the head or neck area. There are many causes and types of headaches. In some cases, the cause may not be found. Follow these instructions at home: Watch your condition for any changes. Let your doctor know about them. Take these steps to help with your condition: Managing pain      Take over-the-counter and prescription medicines only as told by your doctor.  Lie down in a dark, quiet room when you have a headache.  If told, put ice on your head and neck area: ?  Put ice in a plastic bag. ? Place a towel between your skin and the bag. ? Leave the ice on for 20 minutes, 2-3 times per day.  If told, put heat on the affected area. Use the heat source that your doctor recommends, such as a moist heat pack or a heating pad. ? Place a towel between your skin and the heat source. ? Leave the heat on for 20-30 minutes. ? Remove the heat if your skin turns bright red. This is very important if you are unable to feel pain, heat, or cold. You may have a greater risk of getting burned.  Keep lights dim if bright lights  bother you or make your headaches worse. Eating and drinking  Eat meals on a regular schedule.  If you drink alcohol: ? Limit how much you use to:  0-1 drink a day for women.  0-2 drinks a day for men. ? Be aware of how much alcohol is in your drink. In the [place] one drink equals one 12 oz bottle of beer (355 mL), one 5 oz glass of wine (148 mL), or one 1 oz glass of hard liquor (44 mL).  Stop drinking caffeine, or reduce how much caffeine you drink. General instructions   Keep a journal to find out if certain things bring on headaches. For example, write down: ? What you eat and drink. ? How much sleep you get. ? Any change to your diet or medicines.  Get a massage or try other ways to relax.  Limit stress.  Sit up straight. Do not tighten (tense) your muscles.  Do not use any products that contain nicotine or tobacco. This includes cigarettes, e-cigarettes, and chewing tobacco. If you need help quitting, ask your doctor.  Exercise regularly as told by your doctor.  Get enough sleep. This often means 7-9 hours of sleep each night.  Keep all follow-up visits as told by your doctor. This is important. Contact a doctor if:  Your symptoms are not helped by medicine.  You have a headache that feels different than the other headaches.  You feel sick to your stomach (nauseous) or you throw up (vomit).  You have a fever. Get help right away if:  Your headache gets very bad quickly.  Your headache gets worse after a lot of physical activity.  You keep throwing up.  You have a stiff neck.  You have trouble seeing.  You have trouble speaking.  You have pain in the eye or ear.  Your muscles are weak or you lose muscle control.  You lose your balance or have trouble walking.  You feel like you will pass out (faint) or you pass out.  You are mixed up (confused).  You have a seizure. Summary  A headache is pain or discomfort that is felt around the head or  neck area.  There are many causes and types of headaches. In some cases, the cause may not be found.  Keep a journal to help find out what causes your headaches. Watch your condition for any changes. Let your doctor know about them.  Contact a doctor if you have a headache that is different from usual, or if your headache is not helped by medicine.  Get help right away if your headache gets very bad, you throw up, you have trouble seeing, you lose your balance, or you have a seizure. This information is not intended to replace advice given to you by your health care provider. Make  sure you discuss any questions you have with your health care provider. Document Revised: 12/19/2017 Document Reviewed: 12/19/2017 Elsevier Patient Education  [place].

## 2020-04-01 NOTE — Progress Notes (Signed)
MyChart Video Visit    Virtual Visit via Video Note   This visit type was conducted due to national recommendations for restrictions regarding the COVID-19 Pandemic (e.g. social distancing) in an effort to limit this patient's exposure and mitigate transmission in our community. This patient is at least at moderate risk for complications without adequate follow up. This format is felt to be most appropriate for this patient at this time. Physical exam was limited by quality of the video and audio technology used for the visit.   Patient location: at home  Provider location: Provider: Provider's office at  [facility], [place].     I discussed the limitations of evaluation and management by telemedicine and the availability of in person appointments. The patient expressed understanding and agreed to proceed.  Patient: [name]   DOB: [birth year]   34 y.o. Female  MRN: [id number] Visit Date: 04/01/2020  Today's healthcare provider: Marcille Buffy, FNP   Chief Complaint  Patient presents with   Fever   Subjective    Fever  This is a new problem. The current episode started in the past 7 days. The problem has been unchanged. The maximum temperature noted was 100 to 100.9 F. Associated symptoms include headaches, nausea and sleepiness. Pertinent negatives include no abdominal pain, chest pain, congestion, coughing, diarrhea, ear pain, muscle aches, rash, sore throat, urinary pain, vomiting or wheezing. She has tried nothing for the symptoms.    She has some mild dizziness x 2 days. Had an episode of sudden.  Denies any urinary symptoms.  Home pregnancy test was negative. No left ovary due to torsion was removed. She has right ovary and history of ovarian cysts.   No LMP recorded. 03/21/2020 had heavy menses this month with heavy clotting was around 7-9 days late for her cycle and has had occasional pelvic discomfort.   Denies any palpitations or  chest pain. Denies shortness of   breath.  Temp now 99.6. has been 101. Denies any cough, sore throat or ear pain. She feels more tired and sluggish today after the headache she had last night. No history of seizures in past.   Speech is normal.She has a long standing history of migraines, but reports that these headaches have been different, she has an aura with slowed speech and dizziness noted. Denies any visual changes.She had an episode  Where " I felt like I was out of it for a little while" . I could hear what was going on I just was sluggish answering " .  Last was 03/31/2020 and symptoms have resolved mostly. Has long standing history of migraines but reports last night was different.  Denies any paresthesias.  Denies any alcohol or drug use. Denies any syncope. She had eaten prior to episode.   Chronic history of nausea is followed by [facility]. Denies difficulty walking or talking currently.  " feels better today". She had headaches in the past with this, she had headache night before but resolved.   Patient  denies any fever, body aches,chills, rash, chest pain, shortness of breath, nausea, vomiting, or diarrhea.   Denies any trauma. Denies any ill exposure.      Medications: Outpatient Medications Prior to Visit  Medication Sig   ascorbic acid (VITAMIN C) 500 MG tablet Take by mouth.   aspirin 325 MG tablet Take 325 mg by mouth daily.   betamethasone dipropionate 0.05 % cream Apply topically 2 (two) times daily. Apply sparingly to  area of concern   cyanocobalamin 1000 MCG tablet Take by mouth.   EPINEPHrine 0.3 mg/0.3 mL IJ SOAJ injection Inject into the muscle.   ferrous sulfate (FERROUSUL) 325 (65 FE) MG tablet Take 1 tablet (325 mg total) by mouth every other day.   hydrochlorothiazide (HYDRODIURIL) 25 MG tablet Take 0.5 tablets (12.5 mg total) by mouth daily as needed (as needed for fluid retention).   hydrOXYzine (ATARAX/VISTARIL) 50 MG tablet Take 1 tablet (50 mg  total) by mouth every 8 (eight) hours as needed for itching (as needed).   ketorolac (TORADOL) 10 MG tablet Take 1 tablet (10 mg total) by mouth daily as needed for severe pain (associated with menstraul cramps only PRN).   LORazepam (ATIVAN) 0.5 MG tablet Take 1 tablet (0.5 mg total) by mouth 2 (two) times daily as needed for anxiety. Not to combine with Trazodone.   pantoprazole (PROTONIX) 40 MG tablet Take 1 tablet (40 mg total) by mouth 2 (two) times daily.   SUMAtriptan (IMITREX) 50 MG tablet TAKE 1 TABLET BY MOUTH AS NEEDED AT ONSET OF SYMPTOMS. MAY REPEAT IN 2 HOURS IF NEEDED. NO MORE THAN 3 DOSES IN 24 HOURS   SYMBICORT 80-4.5 MCG/ACT inhaler Inhale 2 puffs into the lungs 2 (two) times daily.   Tiotropium Bromide Monohydrate (SPIRIVA RESPIMAT) 1.25 MCG/ACT AERS Inhale 2 puffs into the lungs daily.   traZODone (DESYREL) 50 MG tablet TAKE 1/2 TO 1 TABLET(25 TO 50 MG) BY MOUTH AT BEDTIME AS NEEDED FOR SLEEP   Vitamin D, Ergocalciferol, (DRISDOL) 1.25 MG (50000 UNIT) CAPS capsule TAKE ONE CAPSULE BY MOUTH EVERY 7 DAYS   [DISCONTINUED] cyclobenzaprine (FLEXERIL) 10 MG tablet Take 1 tablet (10 mg total) by mouth at bedtime as needed for muscle spasms. Do not drive or operate machinery/ no legal decision may cause drowsiness.   [DISCONTINUED] doxycycline (VIBRA-TABS) 100 MG tablet Take 1 tablet (100 mg total) by mouth daily.   [DISCONTINUED] ondansetron (ZOFRAN-ODT) 8 MG disintegrating tablet Take 0.5 tablets (4 mg total) by mouth every 8 (eight) hours as needed for nausea or vomiting (only recomneded as needed seek follow up if persisitent symptoms). For migraines/ nausea - chronic   [DISCONTINUED] methylPREDNISolone (MEDROL DOSEPAK) 4 MG TBPK tablet PO: Take 6 tablets on day 1:Take 5 tablets day 2:Take 4 tablets day 3: Take 3 tablets day 4:Take 2 tablets day five: 5 Take 1 tablet day 6   No facility-administered medications prior to visit.    Review of Systems  Constitutional:  Positive for fever.  HENT: Negative for congestion, ear pain and sore throat.   Respiratory: Negative for cough and wheezing.   Cardiovascular: Negative for chest pain.  Gastrointestinal: Positive for nausea. Negative for abdominal pain, diarrhea and vomiting.  Genitourinary: Negative for dysuria.  Skin: Negative for rash.  Neurological: Positive for headaches.      Objective    There were no vitals taken for this visit. B/p last 135/85 heartrate 88 temperature 99.2     Physical Exam    Patient is alert and oriented and responsive to questions Engages in conversation with provider. Speaks in full sentences without any pauses without any shortness of breath or distress.   Assessment & Plan     The primary encounter diagnosis was Cluster headache, not intractable, unspecified chronicity pattern. Diagnoses of Low grade fever, Irregular menstrual bleeding- history of deleayed menses, Slurred speech- history of episode on 04/01/20., and Syncope, near were also pertinent to this visit.  Needs/ recommend  office visit  in person, declines due to covid pandemic. Recommend EKG.   Labs today, will order pelvic US given negative pregnancy test urine at home, no birth control, history of ovarian cyst in past, and late start of menses with reported history of heavy cramping at that time.   CBC/ CMP, SARS IGM given report of low grade temperature x 9 days without any other signs of infection and dizziness.  CT of head given near syncope, and different headache, compared to usual migraine, had slurred speech since resolved.    Discussed in person evaluation in the emergency room should any symptoms persist or return. May need neurological consult  Pending results.   Red Flags discussed. The patient was given clear instructions to go to ER or return to medical center if any red flags develop, symptoms do not improve, worsen or new problems develop. They verbalized understanding.  Return in about  3 days (around 04/04/2020), or if symptoms worsen or fail to improve, for at any time for any worsening symptoms, Go to Emergency room/ urgent care if worse.     I discussed the assessment and treatment plan with the patient. The patient was provided an opportunity to ask questions and all were answered. The patient agreed with the plan and demonstrated an understanding of the instructions.   The patient was advised to call back or seek an in-person evaluation if the symptoms worsen or if the condition fails to improve as anticipated.  I provided 35  minutes of non-face-to-face time during this encounter.    Marcille Buffy, [place] [phone] (phone) [phone] (fax)  [place]

## 2020-04-02 ENCOUNTER — Other Ambulatory Visit: Payer: Self-pay | Admitting: Adult Health

## 2020-04-02 DIAGNOSIS — D75839 Thrombocytosis, unspecified: Secondary | ICD-10-CM

## 2020-04-02 DIAGNOSIS — M9904 Segmental and somatic dysfunction of sacral region: Secondary | ICD-10-CM | POA: Diagnosis not present

## 2020-04-02 DIAGNOSIS — M5451 Vertebrogenic low back pain: Secondary | ICD-10-CM | POA: Diagnosis not present

## 2020-04-02 DIAGNOSIS — R509 Fever, unspecified: Secondary | ICD-10-CM

## 2020-04-02 DIAGNOSIS — M5417 Radiculopathy, lumbosacral region: Secondary | ICD-10-CM | POA: Diagnosis not present

## 2020-04-02 DIAGNOSIS — M25561 Pain in right knee: Secondary | ICD-10-CM | POA: Diagnosis not present

## 2020-04-02 DIAGNOSIS — M9903 Segmental and somatic dysfunction of lumbar region: Secondary | ICD-10-CM | POA: Diagnosis not present

## 2020-04-02 DIAGNOSIS — M9906 Segmental and somatic dysfunction of lower extremity: Secondary | ICD-10-CM | POA: Diagnosis not present

## 2020-04-02 MED ORDER — CLINDAMYCIN HCL 300 MG PO CAPS: 300.0000 mg | ORAL_CAPSULE | Freq: Three times a day (TID) | ORAL | 0 refills | Status: AC

## 2020-04-02 NOTE — Progress Notes (Signed)
[name] please add on TIBC iron, serum ferritin, and ESR for diagnosis of elevated platelets.

## 2020-04-02 NOTE — Progress Notes (Signed)
Within normal limits pelvic ultrasound. Follow up as needed.

## 2020-04-02 NOTE — Progress Notes (Signed)
Orders Placed This Encounter  Procedures  . Ambulatory referral to Hematology

## 2020-04-02 NOTE — Progress Notes (Signed)
Platelets have increased since last check, recommend holding off on vitamin C and B12 supplement. No anemia, WBC is within normal limits.  ALT liver enzyme mildly elevated,recommend avoiding tylenol and decreasing medications processed through liver.   Would recommend an evaluation with hematology for platelets to see if they have any further recommendation or work up, have reviewed with Dr. Patrecia Pace in past will be happy to place referral for you to see him. Just let me know.   HCG negative for pregnancy.   Recheck CBC/ CMP in 2 weeks.   SARS pending.

## 2020-04-02 NOTE — Addendum Note (Signed)
Addended by: Doreen Beam on: 04/02/2020 04:05 PM   Modules accepted: Orders

## 2020-04-03 ENCOUNTER — Ambulatory Visit
Admission: RE | Admit: 2020-04-03 | Discharge: 2020-04-03 | Disposition: A | Discharge status: Home / Self Care | Payer: BC Managed Care – PPO | Source: Ambulatory Visit | Attending: Adult Health | Admitting: Adult Health

## 2020-04-03 ENCOUNTER — Other Ambulatory Visit: Payer: Self-pay

## 2020-04-03 DIAGNOSIS — R509 Fever, unspecified: Secondary | ICD-10-CM | POA: Insufficient documentation

## 2020-04-03 DIAGNOSIS — G44009 Cluster headache syndrome, unspecified, not intractable: Secondary | ICD-10-CM | POA: Diagnosis not present

## 2020-04-03 DIAGNOSIS — R4781 Slurred speech: Secondary | ICD-10-CM | POA: Insufficient documentation

## 2020-04-03 DIAGNOSIS — R55 Syncope and collapse: Secondary | ICD-10-CM | POA: Diagnosis not present

## 2020-04-03 DIAGNOSIS — R42 Dizziness and giddiness: Secondary | ICD-10-CM | POA: Diagnosis not present

## 2020-04-03 LAB — CBC WITH DIFFERENTIAL/PLATELET
Basophils Absolute: 0 10*3/uL (ref 0.0–0.2)
Basos: 0 %
EOS (ABSOLUTE): 0.2 10*3/uL (ref 0.0–0.4)
Eos: 2 %
Hematocrit: 39.4 % (ref 34.0–46.6)
Hemoglobin: 12.9 g/dL (ref 11.1–15.9)
Immature Grans (Abs): 0 10*3/uL (ref 0.0–0.1)
Immature Granulocytes: 0 %
Lymphocytes Absolute: 2.9 10*3/uL (ref 0.7–3.1)
Lymphs: 29 %
MCH: 27.7 pg (ref 26.6–33.0)
MCHC: 32.7 g/dL (ref 31.5–35.7)
MCV: 85 fL (ref 79–97)
Monocytes Absolute: 0.7 10*3/uL (ref 0.1–0.9)
Monocytes: 7 %
Neutrophils Absolute: 6.3 10*3/uL (ref 1.4–7.0)
Neutrophils: 62 %
Platelets: 551 10*3/uL — ABNORMAL HIGH (ref 150–450)
RBC: 4.66 x10E6/uL (ref 3.77–5.28)
RDW: 12.4 % (ref 11.7–15.4)
WBC: 10.1 10*3/uL (ref 3.4–10.8)

## 2020-04-03 LAB — COMPREHENSIVE METABOLIC PANEL
ALT: 39 IU/L — ABNORMAL HIGH (ref 0–32)
AST: 23 IU/L (ref 0–40)
Albumin/Globulin Ratio: 2 (ref 1.2–2.2)
Albumin: 4.8 g/dL (ref 3.8–4.8)
Alkaline Phosphatase: 79 IU/L (ref 44–121)
BUN/Creatinine Ratio: 10 (ref 9–23)
BUN: 9 mg/dL (ref 6–20)
Bilirubin Total: 0.4 mg/dL (ref 0.0–1.2)
CO2: 23 mmol/L (ref 20–29)
Calcium: 9.6 mg/dL (ref 8.7–10.2)
Chloride: 100 mmol/L (ref 96–106)
Creatinine, Ser: 0.88 mg/dL (ref 0.57–1.00)
GFR calc Af Amer: 100 mL/min/{1.73_m2} (ref >59)
GFR calc non Af Amer: 87 mL/min/{1.73_m2} (ref >59)
Globulin, Total: 2.4 g/dL (ref 1.5–4.5)
Glucose: 101 mg/dL — ABNORMAL HIGH (ref 65–99)
Potassium: 4.3 mmol/L (ref 3.5–5.2)
Sodium: 140 mmol/L (ref 134–144)
Total Protein: 7.2 g/dL (ref 6.0–8.5)

## 2020-04-03 LAB — HCG, SERUM, QUALITATIVE: hCG,Beta Subunit,Qual,Serum: NEGATIVE m[IU]/mL (ref <6)

## 2020-04-03 LAB — SARS-COV-2 ANTIBODY, IGM: SARS-CoV-2 Antibody, IgM: NEGATIVE

## 2020-04-03 NOTE — Progress Notes (Signed)
Mild Mucosal edema in left cheek sinus otherwise CT of head is within normal limits and finding is likely representing sinus infection not cleared,  would advise given low grade fever to finish clindamycin if tolerated.   CT of your head is within normal limits.Return to office for in person evaluation if sinus pain persists or changes after completing medication or fever persists.   Would also advised daily allergy medication,over the counter daily such as zyrtec, Claritin, allegra or xyzal once daily per package instructions. and if you have been on one of these antihistamines for more than a year change to another  and Flonase nasal spray twice daily one spray each nostril.    I would recommend evaluation by neurologist if symptoms persist, if you would like to proceed with that let me know preference of location and Dr. If you have neurologist in mind.

## 2020-04-03 NOTE — Progress Notes (Signed)
SARS negative

## 2020-04-03 NOTE — Progress Notes (Signed)
Chest xray is normal

## 2020-04-04 ENCOUNTER — Other Ambulatory Visit: Payer: Self-pay | Admitting: Adult Health

## 2020-04-04 DIAGNOSIS — M62838 Other muscle spasm: Secondary | ICD-10-CM

## 2020-04-04 LAB — CULTURE, URINE COMPREHENSIVE

## 2020-04-04 LAB — IRON AND TIBC
Iron Saturation: 28 % (ref 15–55)
Iron: 106 ug/dL (ref 27–159)
Total Iron Binding Capacity: 374 ug/dL (ref 250–450)
UIBC: 268 ug/dL (ref 131–425)

## 2020-04-04 LAB — SPECIMEN STATUS REPORT

## 2020-04-04 LAB — FERRITIN: Ferritin: 90 ng/mL (ref 15–150)

## 2020-04-04 NOTE — Progress Notes (Signed)
Iron is stable, higher than last check. Ferritin within normal limits.  Urine is ok, mixed urogenital flora and on clindamycin  call if any symptoms occur.

## 2020-04-10 ENCOUNTER — Ambulatory Visit: Payer: Self-pay

## 2020-04-10 ENCOUNTER — Other Ambulatory Visit: Payer: Self-pay | Admitting: Adult Health

## 2020-04-10 DIAGNOSIS — G43829 Menstrual migraine, not intractable, without status migrainosus: Secondary | ICD-10-CM

## 2020-04-10 DIAGNOSIS — D75839 Thrombocytosis, unspecified: Secondary | ICD-10-CM

## 2020-04-10 DIAGNOSIS — R509 Fever, unspecified: Secondary | ICD-10-CM

## 2020-04-10 MED ORDER — DEXLANSOPRAZOLE 60 MG PO CPDR
60.0000 mg | DELAYED_RELEASE_CAPSULE | Freq: Every day | ORAL | 0 refills | Status: DC
Start: 2020-04-10 — End: 2020-07-04

## 2020-04-10 NOTE — Telephone Encounter (Signed)
Noted  

## 2020-04-10 NOTE — Telephone Encounter (Signed)
Called patient because she has a request for Zofran. Med had been DC'd 04/01/20. Last fill states if symptoms continue schedule a visit.  She states that she has had chronic nausea for 16 or mor years.  She states that it is everyday.  She states she rarely vomits.  She has not had weight loss. Per protocol visit has been scheduled next week virtually. Patient requested virtual and date because she has labs she need to get done and would like to discuss those results with Ms Flinchum NP.  Medication request will be sent to office.  Reason for Disposition . Nausea is a chronic symptom (recurrent or ongoing AND present > 4 weeks)  Answer Assessment - Initial Assessment Questions 1. NAUSEA SEVERITY: "How bad is the nausea?" (e.g., mild, moderate, severe; dehydration, weight loss)   - MILD: loss of appetite without change in eating habits   - MODERATE: decreased oral intake without significant weight loss, dehydration, or malnutrition   - SEVERE: inadequate caloric or fluid intake, significant weight loss, symptoms of dehydration     None just chronic 2. ONSET: "When did the nausea begin?"    Many years 3. VOMITING: "Any vomiting?" If Yes, ask: "How many times today?"     Not usually just nauseated 4. RECURRENT SYMPTOM: "Have you had nausea before?" If Yes, ask: "When was the last time?" "What happened that time?"    Ongoing 16-17 years 5. CAUSE: "What do you think is causing the nausea?"    Unsure been to GI 6. PREGNANCY: "Is there any chance you are pregnant?" (e.g., unprotected intercourse, missed birth control pill, broken condom)     No started 8 of october  Protocols used: NAUSEA-A-AH

## 2020-04-10 NOTE — Telephone Encounter (Signed)
Please review. Thanks!  

## 2020-04-10 NOTE — Telephone Encounter (Signed)
Requested medication (s) are due for refill today: yes  Requested medication (s) are on the active medication list: No DC'd 04/01/20 completed course  Last refill:  03/04/20  #90   0 refills  Future visit scheduled: yes  Notes to clinic:  Patient triaged. Med is for chronic nausea. Has scheduled follow up next week. See triage note done today. Med not delegated.    Requested Prescriptions  Pending Prescriptions Disp Refills   ondansetron (ZOFRAN-ODT) 8 MG disintegrating tablet [Pharmacy Med Name: ONDANSETRON ODT 8MG  TABLETS] 90 tablet 0    Sig: TAKE 1/2 TABLET BY MOUTH EVERY 8 HOURS AS NEEDED FOR NAUSEA OR VOMITING(ONLY RECOMMANDED AS NEEDED SEEK FOLLOW UP IF SYMPTOMS PERSIST).      Not Delegated - Gastroenterology: Antiemetics Failed - 04/10/2020 12:08 PM      Failed - This refill cannot be delegated      Passed - Valid encounter within last 6 months    Recent Outpatient Visits           1 week ago Cluster headache, not intractable, unspecified chronicity pattern   [place], FNP   1 month ago Insomnia, unspecified type   [name], Kelby Aline, FNP   3 months ago Acute non-recurrent maxillary sinusitis   [place], FNP   4 months ago Shortness of breath   [name], Kelby Aline, FNP   6 months ago Right upper quadrant abdominal pain   [place] [facility], Kelby Aline, FNP       Future Appointments             In 1 week [facility], Kelby Aline, [place], [place]

## 2020-04-14 DIAGNOSIS — G4733 Obstructive sleep apnea (adult) (pediatric): Secondary | ICD-10-CM | POA: Diagnosis not present

## 2020-04-16 NOTE — Addendum Note (Signed)
Addended by: Doreen Beam on: 04/16/2020 01:43 PM   Modules accepted: Orders

## 2020-04-16 NOTE — Progress Notes (Signed)
Follow up labs for visit on 04/18/2020.

## 2020-04-17 ENCOUNTER — Other Ambulatory Visit: Payer: Self-pay | Admitting: Adult Health

## 2020-04-17 DIAGNOSIS — R509 Fever, unspecified: Secondary | ICD-10-CM

## 2020-04-17 NOTE — Progress Notes (Signed)
Add labs. [facility]

## 2020-04-18 ENCOUNTER — Encounter: Payer: Self-pay | Admitting: Adult Health

## 2020-04-18 ENCOUNTER — Telehealth (INDEPENDENT_AMBULATORY_CARE_PROVIDER_SITE_OTHER): Payer: BC Managed Care – PPO | Admitting: Adult Health

## 2020-04-18 DIAGNOSIS — R110 Nausea: Secondary | ICD-10-CM | POA: Diagnosis not present

## 2020-04-18 DIAGNOSIS — G43829 Menstrual migraine, not intractable, without status migrainosus: Secondary | ICD-10-CM | POA: Diagnosis not present

## 2020-04-18 DIAGNOSIS — K7689 Other specified diseases of liver: Secondary | ICD-10-CM | POA: Insufficient documentation

## 2020-04-18 DIAGNOSIS — R509 Fever, unspecified: Secondary | ICD-10-CM | POA: Diagnosis not present

## 2020-04-18 DIAGNOSIS — R7401 Elevation of levels of liver transaminase levels: Secondary | ICD-10-CM

## 2020-04-18 DIAGNOSIS — R112 Nausea with vomiting, unspecified: Secondary | ICD-10-CM

## 2020-04-18 DIAGNOSIS — R5383 Other fatigue: Secondary | ICD-10-CM

## 2020-04-18 DIAGNOSIS — D75839 Thrombocytosis, unspecified: Secondary | ICD-10-CM

## 2020-04-18 LAB — URINALYSIS, MICROSCOPIC ONLY
Bacteria, UA: NONE SEEN
Casts: NONE SEEN /lpf
RBC, Urine: NONE SEEN /hpf (ref 0–2)
WBC, UA: NONE SEEN /hpf (ref 0–5)

## 2020-04-18 MED ORDER — FLUCONAZOLE 150 MG PO TABS: ORAL_TABLET | ORAL | 0 refills | Status: DC

## 2020-04-18 MED ORDER — KETOROLAC TROMETHAMINE 10 MG PO TABS: 10.0000 mg | ORAL_TABLET | Freq: Every day | ORAL | 0 refills | Status: DC | PRN

## 2020-04-18 MED ORDER — AZITHROMYCIN 250 MG PO TABS: ORAL_TABLET | ORAL | 0 refills | Status: DC

## 2020-04-18 NOTE — Progress Notes (Signed)
Urine within normal limits, sent for culture results in 3- 4 days.

## 2020-04-18 NOTE — Patient Instructions (Addendum)
Avoid NSAIDS when possible can increase nausea.  Follow up with GI MD if persists.   Will repeat MR Abdomen to be sure hepatic cysts not enlarging, ultrasound of abdomen, and KUB flat plate x ray of abdomen to rule out stone in kidney or urinary tract as well as constipation.  Scheduler will be calling you once insurance approval.   Azithromycin to cover atypical infection, given low grade temps sometimes over 100.4. consider infectious disease referral if temperature persists and further testing.  Return for in person exam if symptoms persisting.  Follow up with hematology advised.   Advised patient call the office or your primary care doctor for an appointment if no improvement within 72 hours or if any symptoms change or worsen at any time  Advised ER or urgent Care if after hours or on weekend. Call 911 for emergency symptoms at any time.Patinet verbalized understanding of all instructions given/reviewed and treatment plan and has no further questions or concerns at this time.       Nausea, Adult Nausea is feeling sick to your stomach or feeling that you are about to throw up (vomit). Feeling sick to your stomach is usually not serious, but it may be an early sign of a more serious medical problem. As you feel sicker to your stomach, you may throw up. If you throw up, or if you are not able to drink enough fluids, there is a risk that you may lose too much water in your body (get dehydrated). If you lose too much water in your body, you may:  Feel tired.  Feel thirsty.  Have a dry mouth.  Have cracked lips.  Go pee (urinate) less often. Older adults and people who have other diseases or a weak body defense system (immune system) have a higher risk of losing too much water in the body. The main goals of treating this condition are:  To relieve your nausea.  To ensure your nausea occurs less often.  To prevent throwing up and losing too much fluid. Follow these instructions at  home: Watch your symptoms for any changes. Tell your doctor about them. Follow these instructions as told by your doctor. Eating and drinking      Take an ORS (oral rehydration solution). This is a drink that is sold at pharmacies and stores.  Drink clear fluids in small amounts as you are able. These include: ? Water. ? Ice chips. ? Fruit juice that has water added (diluted fruit juice). ? Low-calorie sports drinks.  Eat bland, easy-to-digest foods in small amounts as you are able, such as: ? Bananas. ? Applesauce. ? Rice. ? Low-fat (lean) meats. ? Toast. ? Crackers.  Avoid drinking fluids that have a lot of sugar or caffeine in them. This includes energy drinks, sports drinks, and soda.  Avoid alcohol.  Avoid spicy or fatty foods. General instructions  Take over-the-counter and prescription medicines only as told by your doctor.  Rest at home while you get better.  Drink enough fluid to keep your pee (urine) pale yellow.  Take slow and deep breaths when you feel sick to your stomach.  Avoid food or things that have strong smells.  Wash your hands often with soap and water. If you cannot use soap and water, use hand sanitizer.  Make sure that all people in your home wash their hands well and often.  Keep all follow-up visits as told by your doctor. This is important. Contact a doctor if:  You feel sicker  to your stomach.  You feel sick to your stomach for more than 2 days.  You throw up.  You are not able to drink fluids without throwing up.  You have new symptoms.  You have a fever.  You have a headache.  You have muscle cramps.  You have a rash.  You have pain while peeing.  You feel light-headed or dizzy. Get help right away if:  You have pain in your chest, neck, arm, or jaw.  You feel very weak or you pass out (faint).  You have throw up that is bright red or looks like coffee grounds.  You have bloody or black poop (stools) or poop  that looks like tar.  You have a very bad headache, a stiff neck, or both.  You have very bad pain, cramping, or bloating in your belly (abdomen).  You have trouble breathing or you are breathing very quickly.  Your heart is beating very quickly.  Your skin feels cold and clammy.  You feel confused.  You have signs of losing too much water in your body, such as: ? Dark pee, very little pee, or no pee. ? Cracked lips. ? Dry mouth. ? Sunken eyes. ? Sleepiness. ? Weakness. These symptoms may be an emergency. Do not wait to see if the symptoms will go away. Get medical help right away. Call your local emergency services (911 in the [place] Do not drive yourself to the hospital. Summary  Nausea is feeling sick to your stomach or feeling that you are about to throw up (vomit).  If you throw up, or if you are not able to drink enough fluids, there is a risk that you may lose too much water in your body (get dehydrated).  Eat and drink what your doctor tells you. Take over-the-counter and prescription medicines only as told by your doctor.  Contact a doctor right away if your symptoms get worse or you have new symptoms.  Keep all follow-up visits as told by your doctor. This is important. This information is not intended to replace advice given to you by your health care provider. Make sure you discuss any questions you have with your health care provider. Document Revised: 11/08/2017 Document Reviewed: 11/08/2017 Elsevier Patient Education  [employer].     Azithromycin tablets What is this medicine? AZITHROMYCIN (az ith roe MYE sin) is a macrolide antibiotic. It is used to treat or prevent certain kinds of bacterial infections. It will not work for colds, flu, or other viral infections. This medicine may be used for other purposes; ask your health care provider or pharmacist if you have questions. COMMON BRAND NAME(S): Zithromax, Zithromax Tri-Pak, Zithromax Z-Pak What  should I tell my health care provider before I take this medicine? They need to know if you have any of these conditions:  history of blood diseases, like leukemia  history of irregular heartbeat  kidney disease  liver disease  myasthenia gravis  an unusual or allergic reaction to azithromycin, erythromycin, other macrolide antibiotics, foods, dyes, or preservatives  pregnant or trying to get pregnant  breast-feeding How should I use this medicine? Take this medicine by mouth with a full glass of water. Follow the directions on the prescription label. The tablets can be taken with food or on an empty stomach. If the medicine upsets your stomach, take it with food. Take your medicine at regular intervals. Do not take your medicine more often than directed. Take all of your medicine as directed  even if you think your are better. Do not skip doses or stop your medicine early. Talk to your pediatrician regarding the use of this medicine in children. While this drug may be prescribed for children as young as 6 months for selected conditions, precautions do apply. Overdosage: If you think you have taken too much of this medicine contact a poison control center or emergency room at once. NOTE: This medicine is only for you. Do not share this medicine with others. What if I miss a dose? If you miss a dose, take it as soon as you can. If it is almost time for your next dose, take only that dose. Do not take double or extra doses. What may interact with this medicine? Do not take this medicine with any of the following medications:  cisapride  dronedarone  pimozide  thioridazine This medicine may also interact with the following medications:  antacids that contain aluminum or magnesium  birth control pills  colchicine  cyclosporine  digoxin  ergot alkaloids like dihydroergotamine, ergotamine  nelfinavir  other medicines that prolong the QT interval (an abnormal heart  rhythm)  phenytoin  warfarin This list may not describe all possible interactions. Give your health care provider a list of all the medicines, herbs, non-prescription drugs, or dietary supplements you use. Also tell them if you smoke, drink alcohol, or use illegal drugs. Some items may interact with your medicine. What should I watch for while using this medicine? Tell your doctor or healthcare provider if your symptoms do not start to get better or if they get worse. This medicine may cause serious skin reactions. They can happen weeks to months after starting the medicine. Contact your healthcare provider right away if you notice fevers or flu-like symptoms with a rash. The rash may be red or purple and then turn into blisters or peeling of the skin. Or, you might notice a red rash with swelling of the face, lips or lymph nodes in your neck or under your arms. Do not treat diarrhea with over the counter products. Contact your doctor if you have diarrhea that lasts more than 2 days or if it is severe and watery. This medicine can make you more sensitive to the sun. Keep out of the sun. If you cannot avoid being in the sun, wear protective clothing and use sunscreen. Do not use sun lamps or tanning beds/booths. What side effects may I notice from receiving this medicine? Side effects that you should report to your doctor or health care professional as soon as possible:  allergic reactions like skin rash, itching or hives, swelling of the face, lips, or tongue  bloody or watery diarrhea  breathing problems  chest pain  fast, irregular heartbeat  muscle weakness  rash, fever, and swollen lymph nodes  redness, blistering, peeling, or loosening of the skin, including inside the mouth  signs and symptoms of liver injury like dark yellow or brown urine; general ill feeling or flu-like symptoms; light-colored stools; loss of appetite; nausea; right upper belly pain; unusually weak or tired;  yellowing of the eyes or skin  white patches or sores in the mouth  unusually weak or tired Side effects that usually do not require medical attention (report to your doctor or health care professional if they continue or are bothersome):  diarrhea  nausea  stomach pain  vomiting This list may not describe all possible side effects. Call your doctor for medical advice about side effects. You may report side effects  to FDA at 1-800-FDA-1088. Where should I keep my medicine? Keep out of the reach of children. Store at room temperature between 15 and 30 degrees C (59 and 86 degrees F). Throw away any unused medicine after the expiration date. NOTE: This sheet is a summary. It may not cover all possible information. If you have questions about this medicine, talk to your doctor, pharmacist, or health care provider.  2020 Elsevier/[employer] Standard (2018-09-07 17:19:20)

## 2020-04-18 NOTE — Progress Notes (Signed)
MyChart Video Visit    Virtual Visit via Video Note   This visit type was conducted due to national recommendations for restrictions regarding the COVID-19 Pandemic (e.g. social distancing) in an effort to limit this patient's exposure and mitigate transmission in our community. This patient is at least at moderate risk for complications without adequate follow up. This format is felt to be most appropriate for this patient at this time. Physical exam was limited by quality of the video and audio technology used for the visit.   Patient location: at home  Provider location: Provider: Provider's office at  [facility], [place].     I discussed the limitations of evaluation and management by telemedicine and the availability of in person appointments. The patient expressed understanding and agreed to proceed.  Patient: [name]   DOB: 1985-09-25   34 y.o. Female  MRN: [id number] Visit Date: 04/18/2020  Today's healthcare provider: [place] Pama Roskos, FNP    Subjective    HPI  Nausea Patient presents today with complaint of nausea for over 30 days has seen GI in past was given Zofran for chronic use. Nausea is unchanged.  Patient states that symptom begins when she first wakes up and last all day. Patient has been taking prescription Zofran and states that she has had moderate relief on medication. She still has GI MD.   History of thrombocytosis has seen Hematology and recently referred back to [facility]ology.   Denies bloody stools, mucous in stools or black stools. Reports regular bowel movements, no urinary or vaginal symptoms. Denies rash.   Lyme testing was negative.   CT of sinus/ head within normal except for Sinuses/Orbits: Mild mucosal edema left maxillary sinus otherwise clear sinuses. Negative orbit  Other: None  IMPRESSION: Negative CT head.  Was started on Clindamycin and completed, sinus pressure has  resolved.   Electronically Signed   By: Franchot Gallo M.D.   On: 04/03/2020 14:28  She was started on dexilant and pending insurance approval.   Questionable HIDA scan in past, denies any worsening symptoms after eating any certain foods.   US pelvic transvaginal 04/01/2020 within normal limits. CT head wo contrast within normal limits.  CT abdomen,  Hepatobiliary: Dense 3.3 cm oval calcification of the posterior right hepatic lobe which appears to affect some of the liver capsule and parenchyma (series 2, image 20). But surrounding liver parenchyma and mesenteric fat appears normal. Elsewhere in the liver only several small hypodense areas are noted, most too small to characterize but the largest in the left lobe measuring about 14 mm has near simple fluid density (17-22 Hounsfield units).  MR IMPRESSION: Multiple scattered tiny liver lesions compatible with cysts. No suspicious hepatic parenchymal abnormality on today's study.  Benign appearing subcapsular calcification in the posterior right liver likely infectious/inflammatory.  Electronically Signed: By: Misty Stanley M.D. On: 10/31/2019 13:12  ADDENDUM REPORT: 10/31/2019 14:06  ADDENDUM: For clarification, the multiple scattered tiny hepatic cysts are new since previous MRI of 10/06/2010 and not substantially changed since CT scan from 2 days ago. Today's MRI suggests that these cysts are simple as there is no internal architecture and no rim enhancement. While infectious etiology is not excluded, it is considered unlikely by MR imaging.   Electronically Signed   By: Misty Stanley M.D.   On: 10/31/2019 14:06   Patient denies abdominal pain today has had in past RUQ and lower abdomen no recent voimiting, no color change or  bile in vomit ,denies  flank pain, dysuria, fatigue, decreased appetite, visual changes, weight loss, diarrhea,  She has had fever of 101 and running low grade fevers. Today is  99.4.  Still wearing  CPAP works good for her. Feels more rested.  lmp 03/21/2020- denies chance of pregnancy or pelvic vaginal symptoms or discharge. Denies any need for STD testing.   Patient  denies any body aches,chills, rash, chest pain, shortness of breath,  Vomiting recently , or diarrhea.  Denies dizziness, lightheadedness, pre syncopal or syncopal episodes.    Patient Active Problem List   Diagnosis Date Noted  . Fatigue 04/18/2020  . Liver cyst 04/18/2020  . Cluster headache, not intractable 04/01/2020  . Low grade fever 04/01/2020  . Irregular menstrual bleeding 04/01/2020  . Syncope, near 04/01/2020  . Slurred speech- history of episode on 04/01/20. 04/01/2020  . Pelvic pain in female 04/01/2020  . Fluid retention 03/04/2020  . Menstrual migraine without status migrainosus, not intractable 03/04/2020  . Local skin infection 03/04/2020  . Poison oak dermatitis 03/04/2020  . History of iron deficiency 12/27/2019  . Obstructive sleep apnea syndrome 11/21/2019  . History of claustrophobia 11/21/2019  . Loud snoring 11/14/2019  . Elevated alanine aminotransferase (ALT) level 11/14/2019  . Enlarged ovary - seen on CT 2021 11/14/2019  . Other ovarian cyst, right side 11/14/2019  . S/P removal of left ovary 11/14/2019  . Asthma, allergic, moderate persistent, uncomplicated 16/03/9603  . Shortness of breath 11/05/2019  . Exposure to parasitic disease 11/02/2019  . Abnormal result on screening urine test 11/02/2019  . Right upper quadrant abdominal pain 10/31/2019  . At high risk for tick borne illness 10/31/2019  . Exposure to mold 10/18/2019  . Wheezing 10/18/2019  . Mass of breast, right- noted since December 2019  10/12/2019  . Intercostal pain 10/12/2019  . Screening for blood or protein in urine 10/12/2019  . Insomnia 10/12/2019  . Abdominal distension (gaseous) 10/12/2019  . History of iron deficiency anemia 10/12/2019  . Excessive vitamin B12 intake 10/12/2019   . Abnormal CT scan, pelvis 10/12/2019  . Gastroesophageal reflux disease 10/12/2019  . Nausea and vomiting 06/26/2019  . Tick bite-possible [place] 11/22/2018  . Allergy to bee sting 12/19/2017  . Thrombocytosis 12/19/2017  . Family history of aortic aneurysm 03/28/2017  . Anxiety 07/07/2016  . Cubital tunnel syndrome on right 06/24/2015  . De Quervain's disease (radial styloid tenosynovitis) 06/24/2015  . Disorder of left external ear 06/06/2015  . Absolute anemia 01/06/2015  . Vitamin D deficiency 01/06/2015  . Pain in left ankle and joints of left foot 01/06/2015  . Biliary and gallbladder disorder 09/17/2014  . Dyspepsia 09/17/2014  . Epigastric pain 09/17/2014  . Tarsal tunnel syndrome of right side 07/30/2014  . Abdominal pain 12/05/2012   No past medical history on file. Allergies  Allergen Reactions  . Benzonatate Shortness Of Breath  . Cephalexin Anaphylaxis and Shortness Of Breath  . Codeine Swelling and Other (See Comments)    Other reaction Other reaction   . Hornet Venom Anaphylaxis  . Prednisone Anaphylaxis and Shortness Of Breath    Received Celestone injection in past per Dr. Jarold Song   . Pseudoephedrine Other (See Comments)    CNS Disorder CNS Disorder   . Pseudoephedrine Hcl Other (See Comments)    No allergic, per pt  . Hydrocodone-Acetaminophen Swelling  . Nitrofurantoin Rash    Ulcers on tongue  . Oxycodone-Acetaminophen Swelling  . Percocet [Oxycodone-Acetaminophen] Swelling  . Vicodin [  Hydrocodone-Acetaminophen] Swelling      Medications: Outpatient Medications Prior to Visit  Medication Sig  . ondansetron (ZOFRAN-ODT) 8 MG disintegrating tablet Take as needed for chronic nausea, only as needed. Try 1/2 tablet first.  . ascorbic acid (VITAMIN C) 500 MG tablet Take by mouth.  Marland Kitchen aspirin 325 MG tablet Take 325 mg by mouth daily.  . betamethasone dipropionate 0.05 % cream Apply topically 2 (two) times daily. Apply sparingly to area of  concern  . cyanocobalamin 1000 MCG tablet Take by mouth.  . dexlansoprazole (DEXILANT) 60 MG capsule Take 1 capsule (60 mg total) by mouth daily.  Marland Kitchen EPINEPHrine 0.3 mg/0.3 mL IJ SOAJ injection Inject into the muscle.  . ferrous sulfate (FERROUSUL) 325 (65 FE) MG tablet Take 1 tablet (325 mg total) by mouth every other day.  . hydrochlorothiazide (HYDRODIURIL) 25 MG tablet Take 0.5 tablets (12.5 mg total) by mouth daily as needed (as needed for fluid retention).  . hydrOXYzine (ATARAX/VISTARIL) 50 MG tablet Take 1 tablet (50 mg total) by mouth every 8 (eight) hours as needed for itching (as needed).  . LORazepam (ATIVAN) 0.5 MG tablet Take 1 tablet (0.5 mg total) by mouth 2 (two) times daily as needed for anxiety. Not to combine with Trazodone.  . SUMAtriptan (IMITREX) 50 MG tablet TAKE 1 TABLET BY MOUTH AS NEEDED AT ONSET OF SYMPTOMS. MAY REPEAT IN 2 HOURS IF NEEDED. NO MORE THAN 3 DOSES IN 24 HOURS  . SYMBICORT 80-4.5 MCG/ACT inhaler Inhale 2 puffs into the lungs 2 (two) times daily.  . Tiotropium Bromide Monohydrate (SPIRIVA RESPIMAT) 1.25 MCG/ACT AERS Inhale 2 puffs into the lungs daily.  . traZODone (DESYREL) 50 MG tablet TAKE 1/2 TO 1 TABLET(25 TO 50 MG) BY MOUTH AT BEDTIME AS NEEDED FOR SLEEP  . Vitamin D, Ergocalciferol, (DRISDOL) 1.25 MG (50000 UNIT) CAPS capsule TAKE ONE CAPSULE BY MOUTH EVERY 7 DAYS  . [DISCONTINUED] fluconazole (DIFLUCAN) 150 MG tablet Take by mouth.  . [DISCONTINUED] ketorolac (TORADOL) 10 MG tablet Take 1 tablet (10 mg total) by mouth daily as needed for severe pain (associated with menstraul cramps only PRN).   No facility-administered medications prior to visit.    Review of Systems  Constitutional: Positive for fatigue and fever. Negative for activity change, appetite change, chills, diaphoresis and unexpected weight change.  HENT: Negative.   Respiratory: Negative.   Cardiovascular: Negative.   Gastrointestinal: Positive for abdominal pain and nausea.  Negative for abdominal distention, anal bleeding, blood in stool, constipation, diarrhea, rectal pain and vomiting.  Genitourinary: Negative for decreased urine volume, difficulty urinating, dyspareunia, dysuria, enuresis, flank pain, frequency, genital sores, hematuria, menstrual problem, pelvic pain, urgency, vaginal bleeding, vaginal discharge and vaginal pain.  Musculoskeletal: Negative.   Skin: Negative.   Neurological: Negative.   Hematological: Negative.   Psychiatric/Behavioral: Negative.     Last CBC Lab Results  Component Value Date   WBC 10.1 04/01/2020   HGB 12.9 04/01/2020   HCT 39.4 04/01/2020   MCV 85 04/01/2020   MCH 27.7 04/01/2020   RDW 12.4 04/01/2020   PLT 551 (H) 94/70/9628   Last metabolic panel Lab Results  Component Value Date   GLUCOSE 101 (H) 04/01/2020   NA 140 04/01/2020   K 4.3 04/01/2020   CL 100 04/01/2020   CO2 23 04/01/2020   BUN 9 04/01/2020   CREATININE 0.88 04/01/2020   GFRNONAA 87 04/01/2020   GFRAA 100 04/01/2020   CALCIUM 9.6 04/01/2020   PROT 7.2 04/01/2020   ALBUMIN  4.8 04/01/2020   LABGLOB 2.4 04/01/2020   AGRATIO 2.0 04/01/2020   BILITOT 0.4 04/01/2020   ALKPHOS 79 04/01/2020   AST 23 04/01/2020   ALT 39 (H) 04/01/2020   Last lipids Lab Results  Component Value Date   CHOL 233 (H) 10/19/2019   HDL 52 10/19/2019   LDLCALC 147 (H) 10/19/2019   TRIG 186 (H) 10/19/2019   CHOLHDL 4.5 (H) 10/19/2019   Last hemoglobin A1c No results found for: HGBA1C Last thyroid functions Lab Results  Component Value Date   TSH 2.480 03/06/2020   T4TOTAL 7.8 03/06/2020   Last vitamin D Lab Results  Component Value Date   VD25OH 41.1 03/11/2020   Last vitamin B12 and Folate Lab Results  Component Value Date   VITAMINB12 817 03/06/2020   FOLATE 9.6 10/19/2019      Objective    LMP 03/21/2020  BP Readings from Last 3 Encounters:  05/16/12 115/79   Wt Readings from Last 3 Encounters:  No data found for Wt      Physical  Exam   Video visit. Unable to come to office.   Patient is alert and oriented and responsive to questions Engages in conversation with provider. Speaks in full sentences without any pauses without any shortness of breath or distress.    Assessment & Plan     The primary encounter diagnosis was Nausea. Diagnoses of Low grade fever, Liver cyst, Menstrual migraine without status migrainosus, not intractable, Thrombocytosis, Nausea and vomiting, intractability of vomiting not specified, unspecified vomiting type, Elevated alanine aminotransferase (ALT) level, and Fatigue, unspecified type were also pertinent to this visit. history of liver cysts in May - radiologist indicated could be infectious. Will rule out and repeat MR.   Orders Placed This Encounter  Procedures  . Aerobic culture  . MR Abdomen W Wo Contrast  . US Abdomen Complete  . DG Abd 1 View  . Rheumatoid Arthritis Profile  . ANA   Takes toradol for menstrual migraine and cramps and has for years, has for years, need to verify update last PAP and have in office or with gynecology. Discussed side effects and can have nausea with this , reports she does not notice change. She takes only if severe cramping.   Meds ordered this encounter  Medications  . azithromycin (ZITHROMAX) 250 MG tablet    Sig: By mouth Take 2 tablets day 1 (500mg  total) and 1 tablet ( 250 mg ) on days 2,3,4,5.    Dispense:  6 tablet    Refill:  0  . fluconazole (DIFLUCAN) 150 MG tablet    Sig: Take one tablet and can repeat dose in 72 hours    Dispense:  2 tablet    Refill:  0  . ketorolac (TORADOL) 10 MG tablet    Sig: Take 1 tablet (10 mg total) by mouth daily as needed for severe pain (associated with menstraul cramps only PRN).    Dispense:  10 tablet    Refill:  0   Going to [facility] today, labs, question autoimmune. May need rheumatology.  Avoid NSAIDS when possible can increase nausea.  Follow up with GI MD if persists.  Advise Zofran only as  needed and 4mg  advise if relieves.  Will repeat MR Abdomen to be sure hepatic cysts not enlarging, ultrasound of abdomen, and KUB flat plate x ray of abdomen to rule out stone in kidney or urinary tract as well as constipation.  Scheduler will be calling you once insurance  approval.   Azithromycin to cover atypical infection, given low grade temps sometimes over 100.4. consider infectious disease referral if temperature persists and further testing.  Return for in person exam if symptoms persisting.  Follow up with hematology advised.   Advised patient call the office or your primary care doctor for an appointment if no improvement within 72 hours or if any symptoms change or worsen at any time  Advised ER or urgent Care if after hours or on weekend. Call 911 for emergency symptoms at any time.Patinet verbalized understanding of all instructions given/reviewed and treatment plan and has no further questions or concerns at this time.    Strict go to ER advised if needed.  Hydration advise.     Return in about 2 weeks (around 05/02/2020), or if symptoms worsen or fail to improve, for at any time for any worsening symptoms, Go to Emergency room/ urgent care if worse.     I discussed the assessment and treatment plan with the patient. The patient was provided an opportunity to ask questions and all were answered. The patient agreed with the plan and demonstrated an understanding of the instructions.   The patient was advised to call back or seek an in-person evaluation if the symptoms worsen or if the condition fails to improve as anticipated.  Addressed acute and or chronic medical problems today requiring 30 minutes reviewing patients medical record,labs, counseling patient regarding patient's conditions, any medications, answering questions regarding health, and coordination of care as needed. After visit summary patient given copy and reviewed. Review of imaging previous. Labs.   I discussed  the limitations of evaluation and management by telemedicine and the availability of in person appointments. The patient expressed understanding and agreed to proceed.  Marcille Buffy, [place] [phone] (phone) [phone] (fax)  [place]

## 2020-04-22 ENCOUNTER — Encounter: Payer: Self-pay | Admitting: Adult Health

## 2020-04-22 ENCOUNTER — Other Ambulatory Visit: Payer: Self-pay | Admitting: Adult Health

## 2020-04-22 DIAGNOSIS — D75839 Thrombocytosis, unspecified: Secondary | ICD-10-CM | POA: Diagnosis not present

## 2020-04-22 DIAGNOSIS — R509 Fever, unspecified: Secondary | ICD-10-CM | POA: Diagnosis not present

## 2020-04-22 DIAGNOSIS — R5383 Other fatigue: Secondary | ICD-10-CM | POA: Diagnosis not present

## 2020-04-22 DIAGNOSIS — R110 Nausea: Secondary | ICD-10-CM | POA: Diagnosis not present

## 2020-04-22 LAB — URINE CULTURE

## 2020-04-22 MED ORDER — AMOXICILLIN-POT CLAVULANATE 875-125 MG PO TABS
1.0000 | ORAL_TABLET | Freq: Two times a day (BID) | ORAL | 0 refills | Status: DC
Start: 2020-04-22 — End: 2020-05-06

## 2020-04-22 NOTE — Progress Notes (Unsigned)
Meds ordered this encounter  Medications  . amoxicillin-clavulanate (AUGMENTIN) 875-125 MG tablet    Sig: Take 1 tablet by mouth 2 (two) times daily.    Dispense:  20 tablet    Refill:  0  Group B strep on urine.

## 2020-04-22 NOTE — Progress Notes (Signed)
Group B strep on urine culture, history of low grade fevers will treat with, She reports only allergy to keflex has done good with Amoxicillin in past without reaction.  Sent to pharmacy   amoxicillin-clavulanate (AUGMENTIN) 875-125 MG tablet   Sig: Take 1 tablet by mouth 2 (two) times daily.   Dispense:  20 tablet   Refill:  0  Recheck urine 2 weeks after completing antibiotics is advised. Around 05/07/20

## 2020-04-23 NOTE — Progress Notes (Signed)
Rheumatoid factor for autoimmune/ rheumatoid arthritis is negative. Other labs still pending will release once resulted and reviewed.

## 2020-04-23 NOTE — Progress Notes (Signed)
Mono qualitative is negative.

## 2020-04-23 NOTE — Progress Notes (Signed)
Sent to patient [facility].- no need to call she will message back if questions.   CBC within normal limits, platelets within normal limits, no signs of blood infection.  INR / aPTT clotting factors within normal limits.  Fibrinogen is within normal limits. Fibrinogen is a protein produced by the liver. This protein helps stop bleeding by helping blood clots to form.  Erythropoietin is within normal limits Erythropoietin is a hormone produced primarily by the kidneys, with small amounts made by the liver.that plays a role in the production of red blood cells which carry oxygen from the lungs to the rest of the body. These levels are all within normal limits. Suspect infection was increasing platelets or heavy menses.   Your other labs are still pending I will release once results return.

## 2020-04-23 NOTE — Progress Notes (Signed)
ANA is negative.

## 2020-04-24 ENCOUNTER — Ambulatory Visit
Admission: RE | Admit: 2020-04-24 | Discharge: 2020-04-24 | Disposition: A | Discharge status: Home / Self Care | Payer: BC Managed Care – PPO | Source: Ambulatory Visit | Attending: Adult Health | Admitting: Adult Health

## 2020-04-24 ENCOUNTER — Other Ambulatory Visit: Payer: Self-pay | Admitting: Adult Health

## 2020-04-24 ENCOUNTER — Ambulatory Visit: Payer: Self-pay

## 2020-04-24 ENCOUNTER — Other Ambulatory Visit: Payer: Self-pay

## 2020-04-24 ENCOUNTER — Telehealth (INDEPENDENT_AMBULATORY_CARE_PROVIDER_SITE_OTHER): Payer: BC Managed Care – PPO | Admitting: Adult Health

## 2020-04-24 ENCOUNTER — Telehealth: Payer: Self-pay | Admitting: Adult Health

## 2020-04-24 DIAGNOSIS — R112 Nausea with vomiting, unspecified: Secondary | ICD-10-CM | POA: Diagnosis not present

## 2020-04-24 DIAGNOSIS — D75839 Thrombocytosis, unspecified: Secondary | ICD-10-CM | POA: Insufficient documentation

## 2020-04-24 DIAGNOSIS — M79662 Pain in left lower leg: Secondary | ICD-10-CM | POA: Diagnosis not present

## 2020-04-24 DIAGNOSIS — R110 Nausea: Secondary | ICD-10-CM | POA: Insufficient documentation

## 2020-04-24 DIAGNOSIS — R600 Localized edema: Secondary | ICD-10-CM | POA: Insufficient documentation

## 2020-04-24 DIAGNOSIS — M722 Plantar fascial fibromatosis: Secondary | ICD-10-CM

## 2020-04-24 DIAGNOSIS — R509 Fever, unspecified: Secondary | ICD-10-CM | POA: Insufficient documentation

## 2020-04-24 DIAGNOSIS — R2242 Localized swelling, mass and lump, left lower limb: Secondary | ICD-10-CM | POA: Diagnosis not present

## 2020-04-24 DIAGNOSIS — K7689 Other specified diseases of liver: Secondary | ICD-10-CM | POA: Insufficient documentation

## 2020-04-24 DIAGNOSIS — B2700 Gammaherpesviral mononucleosis without complication: Secondary | ICD-10-CM

## 2020-04-24 DIAGNOSIS — M79672 Pain in left foot: Secondary | ICD-10-CM | POA: Diagnosis not present

## 2020-04-24 DIAGNOSIS — M7989 Other specified soft tissue disorders: Secondary | ICD-10-CM | POA: Diagnosis not present

## 2020-04-24 DIAGNOSIS — R7401 Elevation of levels of liver transaminase levels: Secondary | ICD-10-CM | POA: Insufficient documentation

## 2020-04-24 DIAGNOSIS — S96911S Strain of unspecified muscle and tendon at ankle and foot level, right foot, sequela: Secondary | ICD-10-CM

## 2020-04-24 DIAGNOSIS — M79604 Pain in right leg: Secondary | ICD-10-CM | POA: Diagnosis not present

## 2020-04-24 DIAGNOSIS — M79605 Pain in left leg: Secondary | ICD-10-CM | POA: Diagnosis not present

## 2020-04-24 LAB — RHEUMATOID ARTHRITIS PROFILE
Cyclic Citrullin Peptide Ab: 5 units (ref 0–19)
Rheumatoid fact SerPl-aCnc: <10 IU/mL (ref 0.0–13.9)

## 2020-04-24 LAB — ANA: Anti Nuclear Antibody (ANA): NEGATIVE

## 2020-04-24 NOTE — Telephone Encounter (Signed)
Patient called and says she's been having lower leg and ankle swelling for the past 1-1.5 weeks. She says the left calf is larger than the right, no feet swelling. She says she has pain at 1-2, no redness noted. She says she has been having a low grade temperature 100-100.2 since the swelling started. Denies CP, SOB, no other symptoms. She asks for a virtual visit, if possible. MyChart video visit scheduled for today at 1340 with Luis Abed, FNP, care advice given, she verbalized understanding.  Reason for Disposition . [1] MODERATE leg swelling (e.g., swelling extends up to knees) AND [2] new-onset or worsening  Answer Assessment - Initial Assessment Questions 1. ONSET: "When did the swelling start?" (e.g., minutes, hours, days)     Worse last week 2. LOCATION: "What part of the leg is swollen?"  "Are both legs swollen or just one leg?"     Both legs, left is worse, below the knee to ankles 3. SEVERITY: "How bad is the swelling?" (e.g., localized; mild, moderate, severe)  - Localized - small area of swelling localized to one leg  - MILD pedal edema - swelling limited to foot and ankle, pitting edema < 1/4 inch (6 mm) deep, rest and elevation eliminate most or all swelling  - MODERATE edema - swelling of lower leg to knee, pitting edema > 1/4 inch (6 mm) deep, rest and elevation only partially reduce swelling  - SEVERE edema - swelling extends above knee, facial or hand swelling present      Moderate 4. REDNESS: "Does the swelling look red or infected?"     No 5. PAIN: "Is the swelling painful to touch?" If Yes, ask: "How painful is it?"   (Scale 1-10; mild, moderate or severe)     Yes 2 6. FEVER: "Do you have a fever?" If Yes, ask: "What is it, how was it measured, and when did it start?"      100.0 since the swelling started 7. CAUSE: "What do you think is causing the leg swelling?"     No 8. MEDICAL HISTORY: "Do you have a history of heart failure, kidney disease, liver failure, or  cancer?"     No 9. RECURRENT SYMPTOM: "Have you had leg swelling before?" If Yes, ask: "When was the last time?" "What happened that time?"     Nothing like this 10. OTHER SYMPTOMS: "Do you have any other symptoms?" (e.g., chest pain, difficulty breathing)      No 11. PREGNANCY: "Is there any chance you are pregnant?" "When was your last menstrual period?"       No  Protocols used: LEG SWELLING AND EDEMA-A-AH

## 2020-04-24 NOTE — Progress Notes (Signed)
MyChart Video Visit    Virtual Visit via Video Note   This visit type was conducted due to national recommendations for restrictions regarding the COVID-19 Pandemic (e.g. social distancing) in an effort to limit this patient's exposure and mitigate transmission in our community. This patient is at least at moderate risk for complications without adequate follow up. This format is felt to be most appropriate for this patient at this time. Physical exam was limited by quality of the video and audio technology used for the visit.   Patient location: at home  Provider location: Provider: Provider's office at  [facility], [place].     I discussed the limitations of evaluation and management by telemedicine and the availability of in person appointments. The patient expressed understanding and agreed to proceed.  Patient: [name]   DOB: 1986-06-06   34 y.o. Female  MRN: [id number] Visit Date: 04/24/2020  Today's healthcare provider: Marcille Buffy, FNP   No chief complaint on file.  Subjective    HPI  Patient presents today with complaint of swelling in both her legs for the past week and half. Patient states that she has noticed swelling more so on her left leg and states that her calf is swollen on left side. Patient denies injury, strenous activity or exercise, redness or pain. Patient states there has been no relieving factors . Onset 1.5 weeks ago.   She also endorses her left calf hurts worse than right.  She denies and or redness or warmth noted.  Left leg is swollen , she has some ankle edema.    Left foot hurts plantar aspect. Denies any trauma or injury, she remembers.  Right foot has history - metatarsal fracture in past. She also has a torn tendon, that possibly needed surgical repair.  She decided against surgery.  She has HCTZ.   Patient  denies any fever, body aches,chills, rash, chest pain, shortness of breath, nausea,  vomiting, or diarrhea.    Patient Active Problem List   Diagnosis Date Noted  . 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 Tear of tendon of right foot 04/25/2020  . Acute foot pain, left 04/24/2020  . Leg edema, right 04/24/2020  . Localized swelling of left lower extremity 04/24/2020  . Pain of left calf 04/24/2020  . Fatigue 04/18/2020  . Liver cyst 04/18/2020  . Cluster headache, not intractable 04/01/2020  . Low grade fever 04/01/2020  . Irregular menstrual bleeding 04/01/2020  . Syncope, near 04/01/2020  . Slurred speech- history of episode on 04/01/20. 04/01/2020  . Pelvic pain in female 04/01/2020  . Fluid retention 03/04/2020  . Menstrual migraine without status migrainosus, not intractable 03/04/2020  . Poison oak dermatitis 03/04/2020  . History of iron deficiency 12/27/2019  . Obstructive sleep apnea syndrome 11/21/2019  . History of claustrophobia 11/21/2019  . Elevated alanine aminotransferase (ALT) level 11/14/2019  . Enlarged ovary - seen on CT 2021 11/14/2019  . Other ovarian cyst, right side 11/14/2019  . S/P removal of left ovary 11/14/2019  . Asthma, allergic, moderate persistent, uncomplicated 16/12/3708  . Shortness of breath 11/05/2019  . Exposure to parasitic disease 11/02/2019  . Abnormal result on screening urine test 11/02/2019  . Right upper quadrant abdominal pain 10/31/2019  . At high risk for tick borne illness 10/31/2019  . Exposure to mold 10/18/2019  . Wheezing 10/18/2019  . Mass of breast, right- noted since December 2019  10/12/2019  . Intercostal pain 10/12/2019  . Insomnia 10/12/2019  .  Abdominal distension (gaseous) 10/12/2019  . History of iron deficiency anemia 10/12/2019  . Excessive vitamin B12 intake 10/12/2019  . Abnormal CT scan, pelvis 10/12/2019  . Gastroesophageal reflux disease 10/12/2019  . Nausea and vomiting 06/26/2019  . Tick bite-possible [place] 11/22/2018  . Allergy to bee sting 12/19/2017  . Thrombocytosis  12/19/2017  . Family history of aortic aneurysm 03/28/2017  . Anxiety 07/07/2016  . Cubital tunnel syndrome on right 06/24/2015  . De Quervain's disease (radial styloid tenosynovitis) 06/24/2015  . Disorder of left external ear 06/06/2015  . Absolute anemia 01/06/2015  . Vitamin D deficiency 01/06/2015  . Pain in left ankle and joints of left foot 01/06/2015  . Biliary and gallbladder disorder 09/17/2014  . Dyspepsia 09/17/2014  . Epigastric pain 09/17/2014  . Tarsal tunnel syndrome of right side 07/30/2014  . Abdominal pain 12/05/2012   History reviewed. No pertinent past medical history. Allergies  Allergen Reactions  . Benzonatate Shortness Of Breath  . Cephalexin Anaphylaxis and Shortness Of Breath  . Codeine Swelling and Other (See Comments)    Other reaction Other reaction   . Hornet Venom Anaphylaxis  . Prednisone Anaphylaxis and Shortness Of Breath    Received Celestone injection in past per Dr. Jarold Song   . Pseudoephedrine Other (See Comments)    CNS Disorder CNS Disorder   . Pseudoephedrine Hcl Other (See Comments)    No allergic, per pt  . Hydrocodone-Acetaminophen Swelling  . Nitrofurantoin Rash    Ulcers on tongue  . Oxycodone-Acetaminophen Swelling  . Percocet [Oxycodone-Acetaminophen] Swelling  . Vicodin [Hydrocodone-Acetaminophen] Swelling      Medications: Outpatient Medications Prior to Visit  Medication Sig  . amoxicillin-clavulanate (AUGMENTIN) 875-125 MG tablet Take 1 tablet by mouth 2 (two) times daily.  Marland Kitchen LORazepam (ATIVAN) 0.5 MG tablet Take 1 tablet (0.5 mg total) by mouth 2 (two) times daily as needed for anxiety. Not to combine with Trazodone.  . ondansetron (ZOFRAN-ODT) 8 MG disintegrating tablet Take as needed for chronic nausea, only as needed. Try 1/2 tablet first.  . SYMBICORT 80-4.5 MCG/ACT inhaler Inhale 2 puffs into the lungs 2 (two) times daily.  . Vitamin D, Ergocalciferol, (DRISDOL) 1.25 MG (50000 UNIT) CAPS capsule TAKE ONE CAPSULE  BY MOUTH EVERY 7 DAYS  . ascorbic acid (VITAMIN C) 500 MG tablet Take by mouth. (Patient not taking: Reported on 04/24/2020)  . aspirin 325 MG tablet Take 325 mg by mouth daily. (Patient not taking: Reported on 04/24/2020)  . betamethasone dipropionate 0.05 % cream Apply topically 2 (two) times daily. Apply sparingly to area of concern  . cyanocobalamin 1000 MCG tablet Take by mouth. (Patient not taking: Reported on 04/24/2020)  . dexlansoprazole (DEXILANT) 60 MG capsule Take 1 capsule (60 mg total) by mouth daily. (Patient not taking: Reported on 04/24/2020)  . EPINEPHrine 0.3 mg/0.3 mL IJ SOAJ injection Inject into the muscle. (Patient not taking: Reported on 04/24/2020)  . fluconazole (DIFLUCAN) 150 MG tablet Take one tablet and can repeat dose in 72 hours (Patient not taking: Reported on 04/24/2020)  . hydrochlorothiazide (HYDRODIURIL) 25 MG tablet Take 0.5 tablets (12.5 mg total) by mouth daily as needed (as needed for fluid retention). (Patient not taking: Reported on 04/24/2020)  . hydrOXYzine (ATARAX/VISTARIL) 50 MG tablet Take 1 tablet (50 mg total) by mouth every 8 (eight) hours as needed for itching (as needed). (Patient not taking: Reported on 04/24/2020)  . ketorolac (TORADOL) 10 MG tablet Take 1 tablet (10 mg total) by mouth daily as needed  for severe pain (associated with menstraul cramps only PRN). (Patient not taking: Reported on 04/24/2020)  . SUMAtriptan (IMITREX) 50 MG tablet TAKE 1 TABLET BY MOUTH AS NEEDED AT ONSET OF SYMPTOMS. MAY REPEAT IN 2 HOURS IF NEEDED. NO MORE THAN 3 DOSES IN 24 HOURS (Patient not taking: Reported on 04/24/2020)  . Tiotropium Bromide Monohydrate (SPIRIVA RESPIMAT) 1.25 MCG/ACT AERS Inhale 2 puffs into the lungs daily. (Patient not taking: Reported on 04/24/2020)  . traZODone (DESYREL) 50 MG tablet TAKE 1/2 TO 1 TABLET(25 TO 50 MG) BY MOUTH AT BEDTIME AS NEEDED FOR SLEEP (Patient not taking: Reported on 04/24/2020)  . [DISCONTINUED] ferrous sulfate  (FERROUSUL) 325 (65 FE) MG tablet Take 1 tablet (325 mg total) by mouth every other day.   No facility-administered medications prior to visit.    Review of Systems  Constitutional: Negative.   HENT: Negative.   Respiratory: Negative.   Cardiovascular: Positive for leg swelling. Negative for chest pain and palpitations.  Gastrointestinal: Negative.   Genitourinary: Negative.   Musculoskeletal: Positive for arthralgias, gait problem, joint swelling and myalgias. Negative for back pain, neck pain and neck stiffness.  Skin: Negative.   Neurological: Negative for dizziness, syncope, speech difficulty, weakness and headaches.  Psychiatric/Behavioral: Negative.     Last CBC Lab Results  Component Value Date   WBC 8.8 04/22/2020   HGB 12.0 04/22/2020   HCT 37.4 04/22/2020   MCV 84 04/22/2020   MCH 27.1 04/22/2020   RDW 12.6 04/22/2020   PLT 425 62/94/7654   Last metabolic panel Lab Results  Component Value Date   GLUCOSE 72 04/22/2020   NA 138 04/22/2020   K 4.3 04/22/2020   CL 102 04/22/2020   CO2 22 04/22/2020   BUN 9 04/22/2020   CREATININE 0.76 04/22/2020   GFRNONAA 103 04/22/2020   GFRAA 119 04/22/2020   CALCIUM 9.1 04/22/2020   PROT 6.6 04/22/2020   ALBUMIN 4.3 04/22/2020   LABGLOB 2.3 04/22/2020   AGRATIO 1.9 04/22/2020   BILITOT 0.3 04/22/2020   ALKPHOS 74 04/22/2020   AST 15 04/22/2020   ALT 21 04/22/2020      Objective    There were no vitals taken for this visit.   Physical Exam    Patient is alert and oriented and responsive to questions Engages in conversation with provider. Speaks in full sentences without any pauses without any shortness of breath or distress.   Left leg/ calf edema more than right, ankle edema left. Pain in plantar aspect of foot per patient pointing to it. Left calf is tender. Has appearence of plantar fibromatosis  on foot and heel.   Assessment & Plan     The primary encounter diagnosis was Pain of left calf. Diagnoses of  Acute foot pain, left, Localized swelling of left lower extremity, Leg edema, right, Plantar fibromatosis, and Tear of tendon of right foot, sequela were also pertinent to this visit.  Rule out DVT, fracture.  History of tendon tear right foot. History of multiple ankle rolling bilat Both feet with appearance of plantar fibroma's may benefit from MRI. And orthopedics if all is negative for DVT or bone involvement. Peripheral edema possible, if negative DVT will restart HCTZ for few days. She takes PRN. No respiratory symptoms.  Return in about 2 weeks (around 05/08/2020), or if symptoms worsen or fail to improve, for Go to Emergency room/ urgent care if worse.     I discussed the assessment and treatment plan with the patient. The patient was  provided an opportunity to ask questions and all were answered. The patient agreed with the plan and demonstrated an understanding of the instructions.   The patient was advised to call back or seek an in-person evaluation if the symptoms worsen or if the condition fails to improve as anticipated.  I provided 30 minutes of non-face-to-face time during this encounter.  Red Flags discussed. The patient was given clear instructions to go to ER or return to medical center if any red flags develop, symptoms do not improve, worsen or new problems develop. They verbalized understanding.  Marcille Buffy, [place] [phone] (phone) [phone] (fax)  [place]

## 2020-04-24 NOTE — Progress Notes (Signed)
NO DVT seen in right or left lower extremity.

## 2020-04-24 NOTE — Progress Notes (Signed)
Orders placed to add on labs at [facility] to current lab - please see 2 lab order add on to fax to [facility]. Should have printed.   EBV IGM is negative meaning  no acute infection now.  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  IGG will peak and then stay positive for life, will recheck EBV IGG in around 3 months to see if it has decreased.

## 2020-04-24 NOTE — Telephone Encounter (Signed)
[name] is calling from Radiology scheduling Patient is at the hospital for an order that was placed today. However, it has not be scheduled. Please advise Does this need to be scheduled STAT- and will be glad to place on the schedule.  Please advise  CB- [phone]

## 2020-04-25 ENCOUNTER — Other Ambulatory Visit: Payer: Self-pay | Admitting: Adult Health

## 2020-04-25 ENCOUNTER — Ambulatory Visit
Admission: RE | Admit: 2020-04-25 | Discharge: 2020-04-25 | Disposition: A | Discharge status: Home / Self Care | Payer: BC Managed Care – PPO | Source: Ambulatory Visit | Attending: Adult Health | Admitting: Adult Health

## 2020-04-25 ENCOUNTER — Encounter: Payer: Self-pay | Admitting: Adult Health

## 2020-04-25 DIAGNOSIS — R600 Localized edema: Secondary | ICD-10-CM | POA: Diagnosis not present

## 2020-04-25 DIAGNOSIS — R2242 Localized swelling, mass and lump, left lower limb: Secondary | ICD-10-CM | POA: Diagnosis not present

## 2020-04-25 DIAGNOSIS — Z8659 Personal history of other mental and behavioral disorders: Secondary | ICD-10-CM

## 2020-04-25 DIAGNOSIS — M79672 Pain in left foot: Secondary | ICD-10-CM | POA: Diagnosis not present

## 2020-04-25 DIAGNOSIS — R7401 Elevation of levels of liver transaminase levels: Secondary | ICD-10-CM | POA: Diagnosis not present

## 2020-04-25 DIAGNOSIS — R110 Nausea: Secondary | ICD-10-CM | POA: Diagnosis not present

## 2020-04-25 DIAGNOSIS — R112 Nausea with vomiting, unspecified: Secondary | ICD-10-CM | POA: Diagnosis not present

## 2020-04-25 DIAGNOSIS — S96911A Strain of unspecified muscle and tendon at ankle and foot level, right foot, initial encounter: Secondary | ICD-10-CM | POA: Insufficient documentation

## 2020-04-25 DIAGNOSIS — R509 Fever, unspecified: Secondary | ICD-10-CM | POA: Diagnosis not present

## 2020-04-25 DIAGNOSIS — K7689 Other specified diseases of liver: Secondary | ICD-10-CM | POA: Diagnosis not present

## 2020-04-25 DIAGNOSIS — M79662 Pain in left lower leg: Secondary | ICD-10-CM | POA: Diagnosis not present

## 2020-04-25 DIAGNOSIS — D75839 Thrombocytosis, unspecified: Secondary | ICD-10-CM | POA: Diagnosis not present

## 2020-04-25 DIAGNOSIS — M722 Plantar fascial fibromatosis: Secondary | ICD-10-CM | POA: Insufficient documentation

## 2020-04-25 MED ORDER — DIAZEPAM 5 MG PO TABS
ORAL_TABLET | ORAL | 0 refills | Status: DC
Start: 2020-04-25 — End: 2020-04-30

## 2020-04-25 NOTE — Progress Notes (Signed)
Negative DVT bilateral.  X rays all within normal limits left ankle, left tibia/ fibula and left foot.  Suspect plantar fascitis in left foot. Given symptoms and history of plantar fascitis  and plantar fibromas suspected, would recommend night brace for plantar fascitis, and easy stretches.  May need orthopedics evaluation given history, if not improving.   Would recommend doing HCTZ as prescribed for 3  to 5 days and PRN and see if improvement in lower leg bilateral edema, elevate legs, compression stockings ok.

## 2020-04-25 NOTE — Telephone Encounter (Signed)
Patient had a virtual visit on 04/24/2020 and no upcoming visit.

## 2020-04-25 NOTE — Progress Notes (Signed)
Continue with MRI. No significant change in hepatic cyst left lobe.

## 2020-04-25 NOTE — Progress Notes (Signed)
RSV and CMV is negative.

## 2020-04-25 NOTE — Telephone Encounter (Signed)
Requested medication (s) are due for refill today - unknown  Requested medication (s) are on the active medication list -yes  Future visit scheduled -no  Last refill: 03/04/20  Notes to clinic: Request non delegated Rx  Requested Prescriptions  Pending Prescriptions Disp Refills   LORazepam (ATIVAN) 0.5 MG tablet [Pharmacy Med Name: LORAZEPAM 0.5MG  TABLETS] 90 tablet     Sig: TAKE 1 TABLET BY MOUTH TWICE DAILY AS NEEDED FOR ANXIETY. NOT TO COMBINE WITH TRAZADONE      Not Delegated - Psychiatry:  Anxiolytics/Hypnotics Failed - 04/25/2020  2:34 PM      Failed - This refill cannot be delegated      Failed - Urine Drug Screen completed in last 360 days      Passed - Valid encounter within last 6 months    Recent Outpatient Visits           Yesterday Pain of left calf   [facility], Kelby Aline, FNP   1 week ago Nausea   [name], Kelby Aline, FNP   3 weeks ago Cluster headache, not intractable, unspecified chronicity pattern   [name], Kelby Aline, FNP   1 month ago Insomnia, unspecified type   [name], Kelby Aline, FNP   3 months ago Acute non-recurrent maxillary sinusitis   [facility], FNP                  Requested Prescriptions  Pending Prescriptions Disp Refills   LORazepam (ATIVAN) 0.5 MG tablet [Pharmacy Med Name: LORAZEPAM 0.5MG  TABLETS] 90 tablet     Sig: TAKE 1 TABLET BY MOUTH TWICE DAILY AS NEEDED FOR ANXIETY. NOT TO COMBINE WITH TRAZADONE      Not Delegated - Psychiatry:  Anxiolytics/Hypnotics Failed - 04/25/2020  2:34 PM      Failed - This refill cannot be delegated      Failed - Urine Drug Screen completed in last 360 days      Passed - Valid encounter within last 6 months    Recent Outpatient Visits           Yesterday Pain of left calf   [place], FNP   1 week ago  Nausea   [facility], FNP   3 weeks ago Cluster headache, not intractable, unspecified chronicity pattern   [place], FNP   1 month ago Insomnia, unspecified type   [name], Kelby Aline, FNP   3 months ago Acute non-recurrent maxillary sinusitis   [facility], Kelby Aline, FNP

## 2020-04-25 NOTE — Progress Notes (Signed)
Non obstructive gas pattern, do recommend starting Metamucil or Psyllium Husks per package instructions mixed with at least 8 oz of liquid.  Small calcification in right upper quadrant liver calcification seen on x ray. No change.

## 2020-04-25 NOTE — Patient Instructions (Signed)
Leg Cramps Leg cramps occur when one or more muscles tighten and you have no control over this tightening (involuntary muscle contraction). Muscle cramps can develop in any muscle, but the most common place is in the calf muscles of the leg. Those cramps can occur during exercise or when you are at rest. Leg cramps are painful, and they may last for a few seconds to a few minutes. Cramps may return several times before they finally stop. Usually, leg cramps are not caused by a serious medical problem. In many cases, the cause is not known. Some common causes include:  Excessive physical effort (overexertion), such as during intense exercise.  Overuse from repetitive motions, or doing the same thing over and over.  Staying in a certain position for a long period of time.  Improper preparation, form, or technique while performing a sport or an activity.  Dehydration.  Injury.  Side effects of certain medicines.  Abnormally low levels of minerals in your blood (electrolytes), especially potassium and calcium. This could result from: ? Pregnancy. ? Taking diuretic medicines. Follow these instructions at home: Eating and drinking  Drink enough fluid to keep your urine pale yellow. Staying hydrated may help prevent cramps.  Eat a healthy diet that includes plenty of nutrients to help your muscles function. A healthy diet includes fruits and vegetables, lean protein, whole grains, and low-fat or nonfat dairy products. Managing pain, stiffness, and swelling      Try massaging, stretching, and relaxing the affected muscle. Do this for several minutes at a time.  If directed, put ice on areas that are sore or painful after a cramp: ? Put ice in a plastic bag. ? Place a towel between your skin and the bag. ? Leave the ice on for 20 minutes, 2-3 times a day.  If directed, apply heat to muscles that are tense or tight. Do this before you exercise, or as often as told by your health  care provider. Use the heat source that your health care provider recommends, such as a moist heat pack or a heating pad. ? Place a towel between your skin and the heat source. ? Leave the heat on for 20-30 minutes. ? Remove the heat if your skin turns bright red. This is especially important if you are unable to feel pain, heat, or cold. You may have a greater risk of getting burned.  Try taking hot showers or baths to help relax tight muscles. General instructions  If you are having frequent leg cramps, avoid intense exercise for several days.  Take over-the-counter and prescription medicines only as told by your health care provider.  Keep all follow-up visits as told by your health care provider. This is important. Contact a health care provider if:  Your leg cramps get more severe or more frequent, or they do not improve over time.  Your foot becomes cold, numb, or blue. Summary  Muscle cramps can develop in any muscle, but the most common place is in the calf muscles of the leg.  Leg cramps are painful, and they may last for a few seconds to a few minutes.  Usually, leg cramps are not caused by a serious medical problem. Often, the cause is not known.  Stay hydrated and take over-the-counter and prescription medicines only as told by your health care provider. This information is not intended to replace advice given to you by your health care provider. Make sure you discuss any questions you have with  your health care provider. Document Revised: 05/13/2017 Document Reviewed: 03/10/2017 Elsevier Patient Education  [place].     Plantar Fasciitis  Plantar fasciitis is a painful foot condition that affects the heel. It occurs when the band of tissue that connects the toes to the heel bone (plantar fascia) becomes irritated. This can happen as the result of exercising too much or doing other repetitive activities (overuse injury). The pain from plantar fasciitis can  range from mild irritation to severe pain that makes it difficult to walk or move. The pain is usually worse in the morning after sleeping, or after sitting or lying down for a while. Pain may also be worse after long periods of walking or standing. What are the causes? This condition may be caused by:  Standing for long periods of time.  Wearing shoes that do not have good arch support.  Doing activities that put stress on joints (high-impact activities), including running, aerobics, and ballet.  Being overweight.  An abnormal way of walking (gait).  Tight muscles in the back of your lower leg (calf).  High arches in your feet.  Starting a new athletic activity. What are the signs or symptoms? The main symptom of this condition is heel pain. Pain may:  Be worse with first steps after a time of rest, especially in the morning after sleeping or after you have been sitting or lying down for a while.  Be worse after long periods of standing still.  Decrease after 30-45 minutes of activity, such as gentle walking. How is this diagnosed? This condition may be diagnosed based on your medical history and your symptoms. Your health care provider may ask questions about your activity level. Your health care provider will do a physical exam to check for:  A tender area on the bottom of your foot.  A high arch in your foot.  Pain when you move your foot.  Difficulty moving your foot. You may have imaging tests to confirm the diagnosis, such as:  X-rays.  Ultrasound.  MRI. How is this treated? Treatment for plantar fasciitis depends on how severe your condition is. Treatment may include:  Rest, ice, applying pressure (compression), and raising the affected foot (elevation). This may be called RICE therapy. Your health care provider may recommend RICE therapy along with over-the-counter pain medicines to manage your pain.  Exercises to stretch your calves and your plantar  fascia.  A splint that holds your foot in a stretched, upward position while you sleep (night splint).  Physical therapy to relieve symptoms and prevent problems in the future.  Injections of steroid medicine (cortisone) to relieve pain and inflammation.  Stimulating your plantar fascia with electrical impulses (extracorporeal shock wave therapy). This is usually the last treatment option before surgery.  Surgery, if other treatments have not worked after 12 months. Follow these instructions at home:  Managing pain, stiffness, and swelling  If directed, put ice on the painful area: ? Put ice in a plastic bag, or use a frozen bottle of water. ? Place a towel between your skin and the bag or bottle. ? Roll the bottom of your foot over the bag or bottle. ? Do this for 20 minutes, 2-3 times a day.  Wear athletic shoes that have air-sole or gel-sole cushions, or try wearing soft shoe inserts that are designed for plantar fasciitis.  Raise (elevate) your foot above the level of your heart while you are sitting or lying down. Activity  Avoid activities that  cause pain. Ask your health care provider what activities are safe for you.  Do physical therapy exercises and stretches as told by your health care provider.  Try activities and forms of exercise that are easier on your joints (low-impact). Examples include swimming, water aerobics, and biking. General instructions  Take over-the-counter and prescription medicines only as told by your health care provider.  Wear a night splint while sleeping, if told by your health care provider. Loosen the splint if your toes tingle, become numb, or turn cold and blue.  Maintain a healthy weight, or work with your health care provider to lose weight as needed.  Keep all follow-up visits as told by your health care provider. This is important. Contact a health care provider if you:  Have symptoms that do not go away after caring for yourself at  home.  Have pain that gets worse.  Have pain that affects your ability to move or do your daily activities. Summary  Plantar fasciitis is a painful foot condition that affects the heel. It occurs when the band of tissue that connects the toes to the heel bone (plantar fascia) becomes irritated.  The main symptom of this condition is heel pain that may be worse after exercising too much or standing still for a long time.  Treatment varies, but it usually starts with rest, ice, compression, and elevation (RICE therapy) and over-the-counter medicines to manage pain. This information is not intended to replace advice given to you by your health care provider. Make sure you discuss any questions you have with your health care provider. Document Revised: 05/13/2017 Document Reviewed: 03/28/2017 Elsevier Patient Education  [place]. Peripheral Edema  Peripheral edema is swelling that is caused by a buildup of fluid. Peripheral edema most often affects the lower legs, ankles, and feet. It can also develop in the arms, hands, and face. The area of the body that has peripheral edema will look swollen. It may also feel heavy or warm. Your clothes may start to feel tight. Pressing on the area may make a temporary dent in your skin. You may not be able to move your swollen arm or leg as much as usual. There are many causes of peripheral edema. It can happen because of a complication of other conditions such as congestive heart failure, kidney disease, or a problem with your blood circulation. It also can be a side effect of certain medicines or because of an infection. It often happens to women during pregnancy. Sometimes, the cause is not known. Follow these instructions at home: Managing pain, stiffness, and swelling   Raise (elevate) your legs while you are sitting or lying down.  Move around often to prevent stiffness and to lessen swelling.  Do not sit or stand for long periods of  time.  Wear support stockings as told by your health care provider. Medicines  Take over-the-counter and prescription medicines only as told by your health care provider.  Your health care provider may prescribe medicine to help your body get rid of excess water (diuretic). General instructions  Pay attention to any changes in your symptoms.  Follow instructions from your health care provider about limiting salt (sodium) in your diet. Sometimes, eating less salt may reduce swelling.  Moisturize skin daily to help prevent skin from cracking and draining.  Keep all follow-up visits as told by your health care provider. This is important. Contact a health care provider if you have:  A fever.  Edema that starts  suddenly or is getting worse, especially if you are pregnant or have a medical condition.  Swelling in only one leg.  Increased swelling, redness, or pain in one or both of your legs.  Drainage or sores at the area where you have edema. Get help right away if you:  Develop shortness of breath, especially when you are lying down.  Have pain in your chest or abdomen.  Feel weak.  Feel faint. Summary  Peripheral edema is swelling that is caused by a buildup of fluid. Peripheral edema most often affects the lower legs, ankles, and feet.  Move around often to prevent stiffness and to lessen swelling. Do not sit or stand for long periods of time.  Pay attention to any changes in your symptoms.  Contact a health care provider if you have edema that starts suddenly or is getting worse, especially if you are pregnant or have a medical condition.  Get help right away if you develop shortness of breath, especially when lying down. This information is not intended to replace advice given to you by your health care provider. Make sure you discuss any questions you have with your health care provider. Document Revised: 02/22/2018 Document Reviewed: 02/22/2018 Elsevier Patient  Education  [place].

## 2020-04-25 NOTE — Telephone Encounter (Signed)
Sorry I just now saw this message. KW

## 2020-04-25 NOTE — Telephone Encounter (Signed)
No worries, it was already scheduled, they were mistaken, Parke Poisson in referrals handled. Completed.

## 2020-04-26 LAB — CBC WITH DIFFERENTIAL/PLATELET
Basophils Absolute: 0 10*3/uL (ref 0.0–0.2)
Basos: 1 %
EOS (ABSOLUTE): 0.2 10*3/uL (ref 0.0–0.4)
Eos: 2 %
Hematocrit: 37.4 % (ref 34.0–46.6)
Hemoglobin: 12 g/dL (ref 11.1–15.9)
Immature Grans (Abs): 0 10*3/uL (ref 0.0–0.1)
Immature Granulocytes: 0 %
Lymphocytes Absolute: 2.4 10*3/uL (ref 0.7–3.1)
Lymphs: 27 %
MCH: 27.1 pg (ref 26.6–33.0)
MCHC: 32.1 g/dL (ref 31.5–35.7)
MCV: 84 fL (ref 79–97)
Monocytes Absolute: 0.6 10*3/uL (ref 0.1–0.9)
Monocytes: 7 %
Neutrophils Absolute: 5.6 10*3/uL (ref 1.4–7.0)
Neutrophils: 63 %
Platelets: 425 10*3/uL (ref 150–450)
RBC: 4.43 x10E6/uL (ref 3.77–5.28)
RDW: 12.6 % (ref 11.7–15.4)
WBC: 8.8 10*3/uL (ref 3.4–10.8)

## 2020-04-26 LAB — COMPREHENSIVE METABOLIC PANEL
ALT: 21 IU/L (ref 0–32)
AST: 15 IU/L (ref 0–40)
Albumin/Globulin Ratio: 1.9 (ref 1.2–2.2)
Albumin: 4.3 g/dL (ref 3.8–4.8)
Alkaline Phosphatase: 74 IU/L (ref 44–121)
BUN/Creatinine Ratio: 12 (ref 9–23)
BUN: 9 mg/dL (ref 6–20)
Bilirubin Total: 0.3 mg/dL (ref 0.0–1.2)
CO2: 22 mmol/L (ref 20–29)
Calcium: 9.1 mg/dL (ref 8.7–10.2)
Chloride: 102 mmol/L (ref 96–106)
Creatinine, Ser: 0.76 mg/dL (ref 0.57–1.00)
GFR calc Af Amer: 119 mL/min/{1.73_m2} (ref >59)
GFR calc non Af Amer: 103 mL/min/{1.73_m2} (ref >59)
Globulin, Total: 2.3 g/dL (ref 1.5–4.5)
Glucose: 72 mg/dL (ref 65–99)
Potassium: 4.3 mmol/L (ref 3.5–5.2)
Sodium: 138 mmol/L (ref 134–144)
Total Protein: 6.6 g/dL (ref 6.0–8.5)

## 2020-04-26 LAB — PROTIME-INR
INR: 1 (ref 0.9–1.2)
Prothrombin Time: 10.3 s (ref 9.1–12.0)

## 2020-04-26 LAB — EPSTEIN-BARR VIRUS VCA, IGM: EBV VCA IgM: <36 U/mL (ref 0.0–35.9)

## 2020-04-26 LAB — EPSTEIN-BARR VIRUS VCA, IGG: EBV VCA IgG: >600 U/mL — ABNORMAL HIGH (ref 0.0–17.9)

## 2020-04-26 LAB — APTT: aPTT: 28 s (ref 24–33)

## 2020-04-26 LAB — ERYTHROPOIETIN: Erythropoietin: 11.3 m[IU]/mL (ref 2.6–18.5)

## 2020-04-26 LAB — RSV(RESPIRATORY SYNCYTIAL VIRUS) AB, BLOOD: RSV Ab: 1:32 {titer} — ABNORMAL HIGH

## 2020-04-26 LAB — CMV IGM: CMV IgM Ser EIA-aCnc: <30 AU/mL (ref 0.0–29.9)

## 2020-04-26 LAB — FIBRINOGEN: Fibrinogen: 351 mg/dL (ref 193–507)

## 2020-04-26 LAB — MONO QUAL W/RFLX QN: Mono Qual W/Rflx Qn: NEGATIVE

## 2020-04-28 NOTE — Telephone Encounter (Signed)
Please review. KW 

## 2020-04-29 ENCOUNTER — Other Ambulatory Visit: Payer: BC Managed Care – PPO

## 2020-04-29 NOTE — Telephone Encounter (Signed)
Has been refilled. PMP aware verified. Please schedule one month follow up.

## 2020-04-30 ENCOUNTER — Other Ambulatory Visit: Payer: Self-pay | Admitting: Adult Health

## 2020-04-30 NOTE — Progress Notes (Signed)
Medication Ativan  was pended and not sent. Sent again. 04/30/2020  E prescribing error

## 2020-05-01 DIAGNOSIS — M5451 Vertebrogenic low back pain: Secondary | ICD-10-CM | POA: Diagnosis not present

## 2020-05-01 DIAGNOSIS — M9904 Segmental and somatic dysfunction of sacral region: Secondary | ICD-10-CM | POA: Diagnosis not present

## 2020-05-01 DIAGNOSIS — M5417 Radiculopathy, lumbosacral region: Secondary | ICD-10-CM | POA: Diagnosis not present

## 2020-05-01 DIAGNOSIS — M9906 Segmental and somatic dysfunction of lower extremity: Secondary | ICD-10-CM | POA: Diagnosis not present

## 2020-05-01 DIAGNOSIS — M25561 Pain in right knee: Secondary | ICD-10-CM | POA: Diagnosis not present

## 2020-05-01 DIAGNOSIS — M9903 Segmental and somatic dysfunction of lumbar region: Secondary | ICD-10-CM | POA: Diagnosis not present

## 2020-05-04 ENCOUNTER — Ambulatory Visit: Admission: RE | Admit: 2020-05-04 | Payer: BC Managed Care – PPO | Source: Ambulatory Visit

## 2020-05-05 ENCOUNTER — Other Ambulatory Visit: Payer: Self-pay | Admitting: Adult Health

## 2020-05-05 NOTE — Telephone Encounter (Signed)
Requested medication (s) are due for refill today: no  Requested medication (s) are on the active medication list: no  Last refill: 02/04/2020  Future visit scheduled: no  Notes to clinic:  this refill cannot be delegated    Requested Prescriptions  Pending Prescriptions Disp Refills   cyclobenzaprine (FLEXERIL) 10 MG tablet [Pharmacy Med Name: CYCLOBENZAPRINE 10MG  TABLETS] 30 tablet 1    Sig: TAKE 1 TABLET(10 MG) BY MOUTH AT BEDTIME AS NEEDED FOR MUSCLE SPASMS. DO NOT DRIVE OR OPERATE MACHINERY/ NO LEGAL DECISION. MAY CAUSE DROWSINESS      Not Delegated - Analgesics:  Muscle Relaxants Failed - 05/05/2020  6:34 AM      Failed - This refill cannot be delegated      Passed - Valid encounter within last 6 months    Recent Outpatient Visits           1 week ago Pain of left calf   [place], FNP   2 weeks ago Nausea   [place], FNP   1 month ago Cluster headache, not intractable, unspecified chronicity pattern   [place], FNP   2 months ago Insomnia, unspecified type   [name], Kelby Aline, FNP   4 months ago Acute non-recurrent maxillary sinusitis   [facility] [place], Kelby Aline, FNP

## 2020-05-06 ENCOUNTER — Other Ambulatory Visit: Payer: Self-pay | Admitting: Adult Health

## 2020-05-06 MED ORDER — AMOXICILLIN-POT CLAVULANATE 875-125 MG PO TABS: 1.0000 | ORAL_TABLET | Freq: Two times a day (BID) | ORAL | 0 refills | Status: DC

## 2020-05-12 DIAGNOSIS — M9906 Segmental and somatic dysfunction of lower extremity: Secondary | ICD-10-CM | POA: Diagnosis not present

## 2020-05-12 DIAGNOSIS — M5451 Vertebrogenic low back pain: Secondary | ICD-10-CM | POA: Diagnosis not present

## 2020-05-12 DIAGNOSIS — M9904 Segmental and somatic dysfunction of sacral region: Secondary | ICD-10-CM | POA: Diagnosis not present

## 2020-05-12 DIAGNOSIS — M25561 Pain in right knee: Secondary | ICD-10-CM | POA: Diagnosis not present

## 2020-05-12 DIAGNOSIS — M9903 Segmental and somatic dysfunction of lumbar region: Secondary | ICD-10-CM | POA: Diagnosis not present

## 2020-05-12 DIAGNOSIS — M5417 Radiculopathy, lumbosacral region: Secondary | ICD-10-CM | POA: Diagnosis not present

## 2020-05-15 ENCOUNTER — Other Ambulatory Visit: Payer: Self-pay

## 2020-05-15 ENCOUNTER — Ambulatory Visit
Admission: RE | Admit: 2020-05-15 | Discharge: 2020-05-15 | Disposition: A | Discharge status: Home / Self Care | Payer: BC Managed Care – PPO | Source: Ambulatory Visit | Attending: Adult Health | Admitting: Adult Health

## 2020-05-15 DIAGNOSIS — R112 Nausea with vomiting, unspecified: Secondary | ICD-10-CM | POA: Insufficient documentation

## 2020-05-15 DIAGNOSIS — K7689 Other specified diseases of liver: Secondary | ICD-10-CM | POA: Insufficient documentation

## 2020-05-15 DIAGNOSIS — M5417 Radiculopathy, lumbosacral region: Secondary | ICD-10-CM | POA: Diagnosis not present

## 2020-05-15 DIAGNOSIS — M9904 Segmental and somatic dysfunction of sacral region: Secondary | ICD-10-CM | POA: Diagnosis not present

## 2020-05-15 DIAGNOSIS — M9903 Segmental and somatic dysfunction of lumbar region: Secondary | ICD-10-CM | POA: Diagnosis not present

## 2020-05-15 DIAGNOSIS — M5451 Vertebrogenic low back pain: Secondary | ICD-10-CM | POA: Diagnosis not present

## 2020-05-15 DIAGNOSIS — M9906 Segmental and somatic dysfunction of lower extremity: Secondary | ICD-10-CM | POA: Diagnosis not present

## 2020-05-15 DIAGNOSIS — M25561 Pain in right knee: Secondary | ICD-10-CM | POA: Diagnosis not present

## 2020-05-15 MED ORDER — GADOBUTROL 1 MMOL/ML IV SOLN
8.0000 mL | Freq: Once | INTRAVENOUS | Status: AC | PRN
  Administered 2020-05-15: 8 mL via INTRAVENOUS

## 2020-05-15 NOTE — Progress Notes (Signed)
MRI of abdomen essentially unchanged and stable from last.  Advise continue to monitor clinically.

## 2020-05-20 DIAGNOSIS — M9906 Segmental and somatic dysfunction of lower extremity: Secondary | ICD-10-CM | POA: Diagnosis not present

## 2020-05-20 DIAGNOSIS — M9904 Segmental and somatic dysfunction of sacral region: Secondary | ICD-10-CM | POA: Diagnosis not present

## 2020-05-20 DIAGNOSIS — M25561 Pain in right knee: Secondary | ICD-10-CM | POA: Diagnosis not present

## 2020-05-20 DIAGNOSIS — M9903 Segmental and somatic dysfunction of lumbar region: Secondary | ICD-10-CM | POA: Diagnosis not present

## 2020-05-20 DIAGNOSIS — M5451 Vertebrogenic low back pain: Secondary | ICD-10-CM | POA: Diagnosis not present

## 2020-05-20 DIAGNOSIS — M5417 Radiculopathy, lumbosacral region: Secondary | ICD-10-CM | POA: Diagnosis not present

## 2020-05-27 ENCOUNTER — Other Ambulatory Visit: Payer: Self-pay | Admitting: Adult Health

## 2020-05-27 DIAGNOSIS — R202 Paresthesia of skin: Secondary | ICD-10-CM

## 2020-05-27 DIAGNOSIS — R609 Edema, unspecified: Secondary | ICD-10-CM

## 2020-05-27 DIAGNOSIS — G43829 Menstrual migraine, not intractable, without status migrainosus: Secondary | ICD-10-CM

## 2020-05-27 DIAGNOSIS — Z8639 Personal history of other endocrine, nutritional and metabolic disease: Secondary | ICD-10-CM

## 2020-05-27 DIAGNOSIS — D509 Iron deficiency anemia, unspecified: Secondary | ICD-10-CM

## 2020-05-27 DIAGNOSIS — M722 Plantar fascial fibromatosis: Secondary | ICD-10-CM

## 2020-05-27 DIAGNOSIS — R7401 Elevation of levels of liver transaminase levels: Secondary | ICD-10-CM

## 2020-05-27 DIAGNOSIS — B2700 Gammaherpesviral mononucleosis without complication: Secondary | ICD-10-CM

## 2020-05-27 DIAGNOSIS — R5382 Chronic fatigue, unspecified: Secondary | ICD-10-CM

## 2020-05-27 DIAGNOSIS — K7689 Other specified diseases of liver: Secondary | ICD-10-CM

## 2020-05-27 DIAGNOSIS — E559 Vitamin D deficiency, unspecified: Secondary | ICD-10-CM

## 2020-05-27 DIAGNOSIS — D75839 Thrombocytosis, unspecified: Secondary | ICD-10-CM

## 2020-05-27 DIAGNOSIS — J4540 Moderate persistent asthma, uncomplicated: Secondary | ICD-10-CM

## 2020-05-27 DIAGNOSIS — Z87440 Personal history of urinary (tract) infections: Secondary | ICD-10-CM

## 2020-05-27 NOTE — Progress Notes (Signed)
Follow up visit to discuss MRI and labs in near future to be scheduled. Follow up on how she is feeling with CPAP machine now for new diagnosis of OSA.

## 2020-05-30 ENCOUNTER — Other Ambulatory Visit: Payer: Self-pay | Admitting: Adult Health

## 2020-05-30 DIAGNOSIS — M9906 Segmental and somatic dysfunction of lower extremity: Secondary | ICD-10-CM | POA: Diagnosis not present

## 2020-05-30 DIAGNOSIS — E612 Magnesium deficiency: Secondary | ICD-10-CM

## 2020-05-30 DIAGNOSIS — M9903 Segmental and somatic dysfunction of lumbar region: Secondary | ICD-10-CM | POA: Diagnosis not present

## 2020-05-30 DIAGNOSIS — M9904 Segmental and somatic dysfunction of sacral region: Secondary | ICD-10-CM | POA: Diagnosis not present

## 2020-05-30 DIAGNOSIS — M25561 Pain in right knee: Secondary | ICD-10-CM | POA: Diagnosis not present

## 2020-05-30 DIAGNOSIS — M5451 Vertebrogenic low back pain: Secondary | ICD-10-CM | POA: Diagnosis not present

## 2020-05-30 DIAGNOSIS — M5417 Radiculopathy, lumbosacral region: Secondary | ICD-10-CM | POA: Diagnosis not present

## 2020-06-02 ENCOUNTER — Other Ambulatory Visit: Payer: Self-pay | Admitting: Adult Health

## 2020-06-02 DIAGNOSIS — R911 Solitary pulmonary nodule: Secondary | ICD-10-CM

## 2020-06-02 DIAGNOSIS — G4733 Obstructive sleep apnea (adult) (pediatric): Secondary | ICD-10-CM

## 2020-06-02 DIAGNOSIS — R0602 Shortness of breath: Secondary | ICD-10-CM

## 2020-06-03 DIAGNOSIS — M25561 Pain in right knee: Secondary | ICD-10-CM | POA: Diagnosis not present

## 2020-06-03 DIAGNOSIS — E612 Magnesium deficiency: Secondary | ICD-10-CM | POA: Diagnosis not present

## 2020-06-03 DIAGNOSIS — M5451 Vertebrogenic low back pain: Secondary | ICD-10-CM | POA: Diagnosis not present

## 2020-06-03 DIAGNOSIS — M9903 Segmental and somatic dysfunction of lumbar region: Secondary | ICD-10-CM | POA: Diagnosis not present

## 2020-06-03 DIAGNOSIS — M9906 Segmental and somatic dysfunction of lower extremity: Secondary | ICD-10-CM | POA: Diagnosis not present

## 2020-06-03 DIAGNOSIS — Z87440 Personal history of urinary (tract) infections: Secondary | ICD-10-CM | POA: Diagnosis not present

## 2020-06-03 DIAGNOSIS — R7401 Elevation of levels of liver transaminase levels: Secondary | ICD-10-CM | POA: Diagnosis not present

## 2020-06-03 DIAGNOSIS — M9904 Segmental and somatic dysfunction of sacral region: Secondary | ICD-10-CM | POA: Diagnosis not present

## 2020-06-03 DIAGNOSIS — M5417 Radiculopathy, lumbosacral region: Secondary | ICD-10-CM | POA: Diagnosis not present

## 2020-06-03 NOTE — Progress Notes (Signed)
MyChart Video Visit    Virtual Visit via Video Note   This visit type was conducted due to national recommendations for restrictions regarding the COVID-19 Pandemic (e.g. social distancing) in an effort to limit this patient's exposure and mitigate transmission in our community. This patient is at least at moderate risk for complications without adequate follow up. This format is felt to be most appropriate for this patient at this time. Physical exam was limited by quality of the video and audio technology used for the visit.   Parties involved in visit as below:   Patient location: at home  Provider location: Provider: Provider's office at  [facility], [place].   I discussed the limitations of evaluation and management by telemedicine and the availability of in person appointments. The patient expressed understanding and agreed to proceed.  Patient: [name]   DOB: 1985-08-05   34 y.o. Female  MRN: [id number] Visit Date: 06/04/2020  Today's healthcare provider: Marcille Buffy, FNP   No chief complaint on file.  Subjective    HPI  Follow up for OSA  The patient was last seen for this 1 months ago. Changes made at last visit include patient was started on CPAP machine. She is wearing her  She reports excellent compliance with treatment. She feels that condition is Improved. She is not having side effects.   Vitamin D is still low she is on supplement will continue.   Needs refills on chronic medications.   Patient  denies any fever, body aches,chills, rash, chest pain, shortness of breath, nausea, vomiting, or diarrhea.   Denies dizziness, lightheadedness, pre syncopal or syncopal episodes.    -----------------------------------------------------------------------------------------   Patient Active Problem List   Diagnosis Date Noted  . 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 Tear of tendon of right foot 04/25/2020  . Acute foot  pain, left 04/24/2020  . Leg edema, right 04/24/2020  . Localized swelling of left lower extremity 04/24/2020  . Pain of left calf 04/24/2020  . Fatigue 04/18/2020  . Liver cyst 04/18/2020  . Cluster headache, not intractable 04/01/2020  . Low grade fever 04/01/2020  . Irregular menstrual bleeding 04/01/2020  . Syncope, near 04/01/2020  . Slurred speech- history of episode on 04/01/20. 04/01/2020  . Pelvic pain in female 04/01/2020  . Fluid retention 03/04/2020  . Menstrual migraine without status migrainosus, not intractable 03/04/2020  . Poison oak dermatitis 03/04/2020  . History of iron deficiency 12/27/2019  . Obstructive sleep apnea syndrome 11/21/2019  . History of claustrophobia 11/21/2019  . Elevated alanine aminotransferase (ALT) level 11/14/2019  . Enlarged ovary - seen on CT 2021 11/14/2019  . Other ovarian cyst, right side 11/14/2019  . S/P removal of left ovary 11/14/2019  . Asthma, allergic, moderate persistent, uncomplicated [id number]  . Shortness of breath 11/05/2019  . Exposure to parasitic disease 11/02/2019  . Abnormal result on screening urine test 11/02/2019  . Right upper quadrant abdominal pain 10/31/2019  . At high risk for tick borne illness 10/31/2019  . Exposure to mold 10/18/2019  . Wheezing 10/18/2019  . Mass of breast, right- noted since December 2019  10/12/2019  . Intercostal pain 10/12/2019  . Insomnia 10/12/2019  . Abdominal distension (gaseous) 10/12/2019  . History of iron deficiency anemia 10/12/2019  . Excessive vitamin B12 intake 10/12/2019  . Abnormal CT scan, pelvis 10/12/2019  . Gastroesophageal reflux disease 10/12/2019  . Nausea 06/26/2019  . Tick bite-possible [place] 11/22/2018  . Allergy to bee  sting 12/19/2017  . Thrombocytosis 12/19/2017  . Family history of aortic aneurysm 03/28/2017  . Anxiety 07/07/2016  . Cubital tunnel syndrome on right 06/24/2015  . De Quervain's disease (radial styloid tenosynovitis)  06/24/2015  . Disorder of left external ear 06/06/2015  . Absolute anemia 01/06/2015  . Vitamin D deficiency 01/06/2015  . Pain in left ankle and joints of left foot 01/06/2015  . Biliary and gallbladder disorder 09/17/2014  . Dyspepsia 09/17/2014  . Epigastric pain 09/17/2014  . Tarsal tunnel syndrome of right side 07/30/2014  . Abdominal pain 12/05/2012   No past medical history on file. Allergies  Allergen Reactions  . Benzonatate Shortness Of Breath  . Cephalexin Anaphylaxis and Shortness Of Breath  . Codeine Swelling and Other (See Comments)    Other reaction Other reaction   . Hornet Venom Anaphylaxis  . Prednisone Anaphylaxis and Shortness Of Breath    Received Celestone injection in past per Dr. Jarold Song   . Pseudoephedrine Other (See Comments)    CNS Disorder CNS Disorder   . Pseudoephedrine Hcl Other (See Comments)    No allergic, per pt  . Hydrocodone-Acetaminophen Swelling  . Nitrofurantoin Rash    Ulcers on tongue  . Oxycodone-Acetaminophen Swelling  . Percocet [Oxycodone-Acetaminophen] Swelling  . Vicodin [Hydrocodone-Acetaminophen] Swelling      Medications: Outpatient Medications Prior to Visit  Medication Sig  . ascorbic acid (VITAMIN C) 500 MG tablet Take by mouth. (Patient not taking: Reported on 04/24/2020)  . cyanocobalamin 1000 MCG tablet Take by mouth. (Patient not taking: Reported on 04/24/2020)  . cyclobenzaprine (FLEXERIL) 10 MG tablet Take 1 tablet (10 mg total) by mouth 3 (three) times daily as needed for muscle spasms.  Marland Kitchen dexlansoprazole (DEXILANT) 60 MG capsule Take 1 capsule (60 mg total) by mouth daily. (Patient not taking: Reported on 04/24/2020)  . EPINEPHrine 0.3 mg/0.3 mL IJ SOAJ injection Inject into the muscle. (Patient not taking: Reported on 04/24/2020)  . SUMAtriptan (IMITREX) 50 MG tablet TAKE 1 TABLET BY MOUTH AS NEEDED AT ONSET OF SYMPTOMS. MAY REPEAT IN 2 HOURS IF NEEDED. NO MORE THAN 3 DOSES IN 24 HOURS (Patient not taking:  Reported on 04/24/2020)  . SYMBICORT 80-4.5 MCG/ACT inhaler Inhale 2 puffs into the lungs 2 (two) times daily.  . Tiotropium Bromide Monohydrate (SPIRIVA RESPIMAT) 1.25 MCG/ACT AERS Inhale 2 puffs into the lungs daily. (Patient not taking: Reported on 04/24/2020)  . traZODone (DESYREL) 50 MG tablet TAKE 1/2 TO 1 TABLET(25 TO 50 MG) BY MOUTH AT BEDTIME AS NEEDED FOR SLEEP (Patient not taking: Reported on 04/24/2020)  . Vitamin D, Ergocalciferol, (DRISDOL) 1.25 MG (50000 UNIT) CAPS capsule Take 1 capsule (50,000 Units total) by mouth every 7 (seven) days.  . [DISCONTINUED] aspirin 325 MG tablet Take 325 mg by mouth daily. (Patient not taking: Reported on 04/24/2020)  . [DISCONTINUED] betamethasone dipropionate 0.05 % cream Apply topically 2 (two) times daily. Apply sparingly to area of concern  . [DISCONTINUED] LORazepam (ATIVAN) 0.5 MG tablet TAKE 1 TABLET BY MOUTH TWICE DAILY AS NEEDED FOR ANXIETY. NOT TO COMBINE WITH TRAZADONE  . [DISCONTINUED] ondansetron (ZOFRAN-ODT) 8 MG disintegrating tablet Take as needed for chronic nausea, only as needed. Try 1/2 tablet first.   No facility-administered medications prior to visit.    Review of Systems    Objective    LMP 05/30/2020   No vital signs available.  Physical Exam    Patient is alert and oriented and responsive to questions Engages in conversation with provider. Speaks in  full sentences without any pauses without any shortness of breath or distress.    Assessment & Plan     The primary encounter diagnosis was Obstructive sleep apnea syndrome. Diagnoses of Migraine with aura and without status migrainosus, not intractable, Chronic fatigue, Anxiety, Asthma, allergic, moderate persistent, uncomplicated, Plantar fibromatosis, Cluster headache, not intractable, unspecified chronicity pattern, and Nausea were also pertinent to this visit.   Refills chronic medications given.  Continue CPAP wear as directed.   Return in about 3 months  (around 09/02/2020), or if symptoms worsen or fail to improve, for at any time for any worsening symptoms, Go to Emergency room/ urgent care if worse.     I discussed the assessment and treatment plan with the patient. The patient was provided an opportunity to ask questions and all were answered. The patient agreed with the plan and demonstrated an understanding of the instructions.   The patient was advised to call back or seek an in-person evaluation if the symptoms worsen or if the condition fails to improve as anticipated.  I provided 30 minutes of non-face-to-face time during this encounter.  The entirety of the information documented in the History of Present Illness, Review of Systems and Physical Exam were personally obtained by me. Portions of this information were initially documented by the CMA and reviewed by me for thoroughness and accuracy.    I discussed the limitations of evaluation and management by telemedicine and the availability of in person appointments. The patient expressed understanding and agreed to proceed.   Marcille Buffy, [place] [phone] (phone) [phone] (fax)  [place]

## 2020-06-04 ENCOUNTER — Other Ambulatory Visit: Payer: Self-pay | Admitting: Adult Health

## 2020-06-04 ENCOUNTER — Telehealth (INDEPENDENT_AMBULATORY_CARE_PROVIDER_SITE_OTHER): Payer: BC Managed Care – PPO | Admitting: Adult Health

## 2020-06-04 ENCOUNTER — Ambulatory Visit: Payer: BC Managed Care – PPO | Admitting: Adult Health

## 2020-06-04 DIAGNOSIS — F419 Anxiety disorder, unspecified: Secondary | ICD-10-CM | POA: Diagnosis not present

## 2020-06-04 DIAGNOSIS — J4540 Moderate persistent asthma, uncomplicated: Secondary | ICD-10-CM

## 2020-06-04 DIAGNOSIS — R110 Nausea: Secondary | ICD-10-CM

## 2020-06-04 DIAGNOSIS — G4733 Obstructive sleep apnea (adult) (pediatric): Secondary | ICD-10-CM

## 2020-06-04 DIAGNOSIS — G43109 Migraine with aura, not intractable, without status migrainosus: Secondary | ICD-10-CM | POA: Diagnosis not present

## 2020-06-04 DIAGNOSIS — R5382 Chronic fatigue, unspecified: Secondary | ICD-10-CM | POA: Diagnosis not present

## 2020-06-04 DIAGNOSIS — G44009 Cluster headache syndrome, unspecified, not intractable: Secondary | ICD-10-CM

## 2020-06-04 DIAGNOSIS — M722 Plantar fascial fibromatosis: Secondary | ICD-10-CM

## 2020-06-04 LAB — LIPID PANEL W/O CHOL/HDL RATIO
Cholesterol, Total: 216 mg/dL — ABNORMAL HIGH (ref 100–199)
HDL: 52 mg/dL (ref >39)
LDL Chol Calc (NIH): 137 mg/dL — ABNORMAL HIGH (ref 0–99)
Triglycerides: 152 mg/dL — ABNORMAL HIGH (ref 0–149)
VLDL Cholesterol Cal: 27 mg/dL (ref 5–40)

## 2020-06-04 LAB — MAGNESIUM: Magnesium: 2.2 mg/dL (ref 1.6–2.3)

## 2020-06-04 MED ORDER — KETOROLAC TROMETHAMINE 10 MG PO TABS: 10.0000 mg | ORAL_TABLET | Freq: Three times a day (TID) | ORAL | 0 refills | Status: DC | PRN

## 2020-06-04 MED ORDER — LORAZEPAM 1 MG PO TABS: 1.0000 mg | ORAL_TABLET | Freq: Three times a day (TID) | ORAL | 0 refills | Status: DC | PRN

## 2020-06-04 MED ORDER — VITAMIN D (ERGOCALCIFEROL) 1.25 MG (50000 UNIT) PO CAPS: 50000.0000 [IU] | ORAL_CAPSULE | ORAL | 0 refills | Status: DC

## 2020-06-04 MED ORDER — METHYLPREDNISOLONE 4 MG PO TBPK: ORAL_TABLET | ORAL | 0 refills | Status: DC

## 2020-06-04 MED ORDER — BETAMETHASONE DIPROPIONATE 0.05 % EX CREA: TOPICAL_CREAM | Freq: Two times a day (BID) | CUTANEOUS | 1 refills | Status: DC

## 2020-06-04 MED ORDER — ONDANSETRON 8 MG PO TBDP: ORAL_TABLET | ORAL | 1 refills | Status: DC

## 2020-06-04 MED ORDER — ESCITALOPRAM OXALATE 10 MG PO TABS: 5.0000 mg | ORAL_TABLET | Freq: Every day | ORAL | 0 refills | Status: DC

## 2020-06-04 MED ORDER — ASPIRIN 325 MG PO TABS: 325.0000 mg | ORAL_TABLET | Freq: Every day | ORAL | 1 refills | Status: DC

## 2020-06-04 NOTE — Progress Notes (Signed)
Magnesium within normal limits.

## 2020-06-04 NOTE — Progress Notes (Signed)
Total cholesterol, triglycerides and LDL elevated.  Discussed lifestyle modification with patient e.g. increase exercise, decrease red meats, increase fiber, fruits, vegetables, lean meat, and omega 3/fish intake and decrease saturated fat.  If patient following strict diet and exercise program already please schedule follow up appointment with primary care physician Liver and kidney function is within normal limits.  Bilirubin within normal limits.  Parathyroid hormone within normal limits.  Hepatitis B negative.  Sed rate is within normal limits.  C- reactive protein within normal limits.  APTT , INR , PT clotting times within normal limits.  BNP is within normal limits.  Vitamin B12 within normal.  TSH within normal limits.  Iron saturation is low again and platelets are elevated, suspect heavy menses on certain days as cause of elevation. 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 Offered hematology referral again at anytime if needed and patient is able to contact her hematologist at [facility]. Recommenced Iron supplementation at least 1- 2 weeks around menses to maintain normal iron saturation level. Cytomegalovirus negative.  Vitamin D low again, supplement refilled, take for 12 more weeks, and then once per month after that. Liver enzymes, and kidney function within normal limits. Electrolytes within normal limits. Glucose within normal limits.  Hepatitis C is negative.  Other labs are still pending will result to [place] once resulted.  Recommend recheck of platelets within 1 to  3 months, lipid panel recheck in 3 months after dietary changes and lifestyle changes.

## 2020-06-05 ENCOUNTER — Other Ambulatory Visit: Payer: Self-pay

## 2020-06-05 ENCOUNTER — Ambulatory Visit
Admission: RE | Admit: 2020-06-05 | Discharge: 2020-06-05 | Disposition: A | Discharge status: Home / Self Care | Payer: BC Managed Care – PPO | Source: Ambulatory Visit | Attending: Adult Health | Admitting: Adult Health

## 2020-06-05 DIAGNOSIS — R911 Solitary pulmonary nodule: Secondary | ICD-10-CM | POA: Diagnosis not present

## 2020-06-05 DIAGNOSIS — R0602 Shortness of breath: Secondary | ICD-10-CM | POA: Insufficient documentation

## 2020-06-05 DIAGNOSIS — G4733 Obstructive sleep apnea (adult) (pediatric): Secondary | ICD-10-CM | POA: Diagnosis not present

## 2020-06-05 MED ORDER — IOHEXOL 300 MG/ML  SOLN
75.0000 mL | Freq: Once | INTRAMUSCULAR | Status: AC | PRN
  Administered 2020-06-05: 16:00:00 75 mL via INTRAVENOUS

## 2020-06-08 ENCOUNTER — Other Ambulatory Visit: Payer: Self-pay | Admitting: Adult Health

## 2020-06-08 DIAGNOSIS — F419 Anxiety disorder, unspecified: Secondary | ICD-10-CM

## 2020-06-08 LAB — CULTURE, URINE COMPREHENSIVE

## 2020-06-09 NOTE — Progress Notes (Signed)
RSV  Ab 1:16 slightly decreased from one month ago. B12 okay. Heavy metals profile within normal limits. Total bilirubin within normal limits Parathyroid hormone within normal limits.Transferrin within normal limits.  Factor  IV Leiden is within normal limits not detected. Zinc within normal limits. Negative food allergy profile.    Recommend repeat urine culture as the results are non conclusive.  Urine Culture, Comprehensive Final report  Organism ID, Bacteria Comment  Comment: More than 3 organisms recovered, none predominant. Please submit  another culture if clinically indicated.  Greater than 100,000 colony forming units per mL

## 2020-06-10 ENCOUNTER — Encounter: Payer: Self-pay | Admitting: Adult Health

## 2020-06-10 NOTE — Patient Instructions (Signed)
Health Maintenance, Female Adopting a healthy lifestyle and getting preventive care are important in promoting health and wellness. Ask your health care provider about:  The right schedule for you to have regular tests and exams.  Things you can do on your own to prevent diseases and keep yourself healthy. What should I know about diet, weight, and exercise? Eat a healthy diet   Eat a diet that includes plenty of vegetables, fruits, low-fat dairy products, and lean protein.  Do not eat a lot of foods that are high in solid fats, added sugars, or sodium. Maintain a healthy weight Body mass index (BMI) is used to identify weight problems. It estimates body fat based on height and weight. Your health care provider can help determine your BMI and help you achieve or maintain a healthy weight. Get regular exercise Get regular exercise. This is one of the most important things you can do for your health. Most adults should:  Exercise for at least 150 minutes each week. The exercise should increase your heart rate and make you sweat (moderate-intensity exercise).  Do strengthening exercises at least twice a week. This is in addition to the moderate-intensity exercise.  Spend less time sitting. Even light physical activity can be beneficial. Watch cholesterol and blood lipids Have your blood tested for lipids and cholesterol at 33 years of age, then have this test every 5 years. Have your cholesterol levels checked more often if:  Your lipid or cholesterol levels are high.  You are older than 33 years of age.  You are at high risk for heart disease. What should I know about cancer screening? Depending on your health history and family history, you may need to have cancer screening at various ages. This may include screening for:  Breast cancer.  Cervical cancer.  Colorectal cancer.  Skin cancer.  Lung cancer. What should I know about heart disease, diabetes, and high blood  pressure? Blood pressure and heart disease  High blood pressure causes heart disease and increases the risk of stroke. This is more likely to develop in people who have high blood pressure readings, are of African descent, or are overweight.  Have your blood pressure checked: ? Every 3-5 years if you are 18-39 years of age. ? Every year if you are 40 years old or older. Diabetes Have regular diabetes screenings. This checks your fasting blood sugar level. Have the screening done:  Once every three years after age 40 if you are at a normal weight and have a low risk for diabetes.  More often and at a younger age if you are overweight or have a high risk for diabetes. What should I know about preventing infection? Hepatitis B If you have a higher risk for hepatitis B, you should be screened for this virus. Talk with your health care provider to find out if you are at risk for hepatitis B infection. Hepatitis C Testing is recommended for:  Everyone born from 1945 through 1965.  Anyone with known risk factors for hepatitis C. Sexually transmitted infections (STIs)  Get screened for STIs, including gonorrhea and chlamydia, if: ? You are sexually active and are younger than 33 years of age. ? You are older than 33 years of age and your health care provider tells you that you are at risk for this type of infection. ? Your sexual activity has changed since you were last screened, and you are at increased risk for chlamydia or gonorrhea. Ask your health care provider if   you are at risk.  Ask your health care provider about whether you are at high risk for HIV. Your health care provider may recommend a prescription medicine to help prevent HIV infection. If you choose to take medicine to prevent HIV, you should first get tested for HIV. You should then be tested every 3 months for as long as you are taking the medicine. Pregnancy  If you are about to stop having your period (premenopausal) and  you may become pregnant, seek counseling before you get pregnant.  Take 400 to 800 micrograms (mcg) of folic acid every day if you become pregnant.  Ask for birth control (contraception) if you want to prevent pregnancy. Osteoporosis and menopause Osteoporosis is a disease in which the bones lose minerals and strength with aging. This can result in bone fractures. If you are 88 years old or older, or if you are at risk for osteoporosis and fractures, ask your health care provider if you should:  Be screened for bone loss.  Take a calcium or vitamin D supplement to lower your risk of fractures.  Be given hormone replacement therapy (HRT) to treat symptoms of menopause. Follow these instructions at home: Lifestyle  Do not use any products that contain nicotine or tobacco, such as cigarettes, e-cigarettes, and chewing tobacco. If you need help quitting, ask your health care provider.  Do not use street drugs.  Do not share needles.  Ask your health care provider for help if you need support or information about quitting drugs. Alcohol use  Do not drink alcohol if: ? Your health care provider tells you not to drink. ? You are pregnant, may be pregnant, or are planning to become pregnant.  If you drink alcohol: ? Limit how much you use to 0-1 drink a day. ? Limit intake if you are breastfeeding.  Be aware of how much alcohol is in your drink. In the [place] one drink equals one 12 oz bottle of beer (355 mL), one 5 oz glass of wine (148 mL), or one 1 oz glass of hard liquor (44 mL). General instructions  Schedule regular health, dental, and eye exams.  Stay current with your vaccines.  Tell your health care provider if: ? You often feel depressed. ? You have ever been abused or do not feel safe at home. Summary  Adopting a healthy lifestyle and getting preventive care are important in promoting health and wellness.  Follow your health care provider's instructions about healthy  diet, exercising, and getting tested or screened for diseases.  Follow your health care provider's instructions on monitoring your cholesterol and blood pressure. This information is not intended to replace advice given to you by your health care provider. Make sure you discuss any questions you have with your health care provider. Document Revised: 05/24/2018 Document Reviewed: 05/24/2018 Elsevier Patient Education  [place] Virus Testing Why am I having this test? You may be tested for an Epstein-Barr virus (EBV) infection if you have symptoms of an infection, such as fatigue, fever, sore throat, enlarged lymph glands, or an enlarged spleen. EBV is a common cause of infectious mononucleosis, or "mono." What is being tested? This test checks for the presence of the Epstein-Barr virus antibodies in your blood. Antibodies are a type of cell that is part of the body's disease-fighting (immune) system. After you get an infection, your body makes antibodies that stay in your body after you recover and protect you from getting the same infection again. What  kind of sample is taken?  A blood sample is required for this test. It is usually collected by inserting a needle into a blood vessel. You may have a sample taken when you first start noticing symptoms. You may have another sample taken 1-3 weeks later. Tell a health care provider about:  All medicines you are taking, including vitamins, herbs, eye drops, creams, and over-the-counter medicines.  Any medical conditions you have.  Whether you are pregnant or may be pregnant. How are the results reported? Your results will be reported as a value that refers to the concentration of antibody in your blood (titer). Your health care provider will compare your results to normal ranges that were established after testing a large group of people (reference values). Reference values may vary among labs and hospitals. For this test,  common reference values are:  A result of 1:10 or less is considered negative, meaning that you do not have an EBV infection.  A result of 1:10-1:60 means that you may have had an EBV infection at some point in the past.  A result that is 1:320 or higher may mean that you have a current (active) EBV infection. What do the results mean? If you have an abnormal result, you may have this test repeated 10-14 days later. If your second result is four times higher than your first result, this confirms that you have a sudden (acute) EBV infection. Talk with your health care provider about what your results mean. Questions to ask your health care provider Ask your health care provider, or the department that is doing the test:  When will my results be ready?  How will I get my results?  What are my treatment options?  What other tests do I need?  What are my next steps? Summary  You may be tested for an Epstein-Barr virus (EBV) infection if you have symptoms of an infection, such as fatigue, fever, sore throat, enlarged lymph glands, or an enlarged spleen. EBV is a common cause of infectious mononucleosis, or "mono."  This test checks for the presence of the Epstein-Barr virus antibodies in your blood. Antibodies are a type of cell that is part of the body's disease-fighting (immune) system. After you get an infection, your body makes antibodies that stay in your body after you recover and protect you from getting the same infection again.  Talk with your health care provider about what your results mean. This information is not intended to replace advice given to you by your health care provider. Make sure you discuss any questions you have with your health care provider. Document Revised: 05/13/2017 Document Reviewed: 01/04/2017 Elsevier Patient Education  [place]. CPAP and BPAP Information CPAP and BPAP are methods of helping a person breathe with the use of air pressure. CPAP  stands for "continuous positive airway pressure." BPAP stands for "bi-level positive airway pressure." In both methods, air is blown through your nose or mouth and into your air passages to help you breathe well. CPAP and BPAP use different amounts of pressure to blow air. With CPAP, the amount of pressure stays the same while you breathe in and out. With BPAP, the amount of pressure is increased when you breathe in (inhale) so that you can take larger breaths. Your health care provider will recommend whether CPAP or BPAP would be more helpful for you. Why are CPAP and BPAP treatments used? CPAP or BPAP can be helpful if you have:  Sleep apnea.  Chronic  obstructive pulmonary disease (COPD).  Heart failure.  Medical conditions that weaken the muscles of the chest including muscular dystrophy, or neurological diseases such as amyotrophic lateral sclerosis (ALS).  Other problems that cause breathing to be weak, abnormal, or difficult. CPAP is most commonly used for obstructive sleep apnea (OSA) to keep the airways from collapsing when the muscles relax during sleep. How is CPAP or BPAP administered? Both CPAP and BPAP are provided by a small machine with a flexible plastic tube that attaches to a plastic mask. You wear the mask. Air is blown through the mask into your nose or mouth. The amount of pressure that is used to blow the air can be adjusted on the machine. Your health care provider will determine the pressure setting that should be used based on your individual needs. When should CPAP or BPAP be used? In most cases, the mask only needs to be worn during sleep. Generally, the mask needs to be worn throughout the night and during any daytime naps. People with certain medical conditions may also need to wear the mask at other times when they are awake. Follow instructions from your health care provider about when to use the machine. What are some tips for using the mask?   Because the mask  needs to be snug, some people feel trapped or closed-in (claustrophobic) when first using the mask. If you feel this way, you may need to get used to the mask. One way to do this is by holding the mask loosely over your nose or mouth and then gradually applying the mask more snugly. You can also gradually increase the amount of time that you use the mask.  Masks are available in various types and sizes. Some fit over your mouth and nose while others fit over just your nose. If your mask does not fit well, talk with your health care provider about getting a different one.  If you are using a mask that fits over your nose and you tend to breathe through your mouth, a chin strap may be applied to help keep your mouth closed.  The CPAP and BPAP machines have alarms that may sound if the mask comes off or develops a leak.  If you have trouble with the mask, it is very important that you talk with your health care provider about finding a way to make the mask easier to tolerate. Do not stop using the mask. Stopping the use of the mask could have a negative impact on your health. What are some tips for using the machine?  Place your CPAP or BPAP machine on a secure table or stand near an electrical outlet.  Know where the on/off switch is located on the machine.  Follow instructions from your health care provider about how to set the pressure on your machine and when you should use it.  Do not eat or drink while the CPAP or BPAP machine is on. Food or fluids could get pushed into your lungs by the pressure of the CPAP or BPAP.  Do not smoke. Tobacco smoke residue can damage the machine.  For home use, CPAP and BPAP machines can be rented or purchased through home health care companies. Many different brands of machines are available. Renting a machine before purchasing may help you find out which particular machine works well for you.  Keep the CPAP or BPAP machine and attachments clean. Ask your  health care provider for specific instructions. Get help right away if:  You have redness or open areas around your nose or mouth where the mask fits.  You have trouble using the CPAP or BPAP machine.  You cannot tolerate wearing the CPAP or BPAP mask.  You have pain, discomfort, and bloating in your abdomen. Summary  CPAP and BPAP are methods of helping a person breathe with the use of air pressure.  Both CPAP and BPAP are provided by a small machine with a flexible plastic tube that attaches to a plastic mask.  If you have trouble with the mask, it is very important that you talk with your health care provider about finding a way to make the mask easier to tolerate. This information is not intended to replace advice given to you by your health care provider. Make sure you discuss any questions you have with your health care provider. Document Revised: 09/20/2018 Document Reviewed: 04/19/2016 Elsevier Patient Education  [place].

## 2020-06-11 ENCOUNTER — Other Ambulatory Visit: Payer: Self-pay | Admitting: Adult Health

## 2020-06-11 DIAGNOSIS — R7989 Other specified abnormal findings of blood chemistry: Secondary | ICD-10-CM

## 2020-06-11 DIAGNOSIS — R8290 Unspecified abnormal findings in urine: Secondary | ICD-10-CM

## 2020-06-11 NOTE — Progress Notes (Signed)
Orders Placed This Encounter Procedures  CULTURE, URINE COMPREHENSIVE  Urinalysis, microscopic only  CBC with Differential/Platelet

## 2020-06-11 NOTE — Progress Notes (Signed)
Orders Placed This Encounter Procedures  CULTURE, URINE COMPREHENSIVE  Urinalysis, microscopic only  CBC with Differential/Platelet 

## 2020-07-01 LAB — CBC WITH DIFFERENTIAL/PLATELET
Basophils Absolute: 0.1 10*3/uL (ref 0.0–0.2)
Basos: 1 %
EOS (ABSOLUTE): 0.2 10*3/uL (ref 0.0–0.4)
Eos: 2 %
Hematocrit: 40.7 % (ref 34.0–46.6)
Hemoglobin: 13.1 g/dL (ref 11.1–15.9)
Immature Grans (Abs): 0 10*3/uL (ref 0.0–0.1)
Immature Granulocytes: 0 %
Lymphocytes Absolute: 3.1 10*3/uL (ref 0.7–3.1)
Lymphs: 36 %
MCH: 26.9 pg (ref 26.6–33.0)
MCHC: 32.2 g/dL (ref 31.5–35.7)
MCV: 84 fL (ref 79–97)
Monocytes Absolute: 0.7 10*3/uL (ref 0.1–0.9)
Monocytes: 8 %
Neutrophils Absolute: 4.6 10*3/uL (ref 1.4–7.0)
Neutrophils: 53 %
Platelets: 545 10*3/uL — ABNORMAL HIGH (ref 150–450)
RBC: 4.87 x10E6/uL (ref 3.77–5.28)
RDW: 12.6 % (ref 11.7–15.4)
WBC: 8.6 10*3/uL (ref 3.4–10.8)

## 2020-07-01 LAB — COMPREHENSIVE METABOLIC PANEL
ALT: 22 IU/L (ref 0–32)
AST: 18 IU/L (ref 0–40)
Albumin/Globulin Ratio: 2.2 (ref 1.2–2.2)
Albumin: 5.1 g/dL — ABNORMAL HIGH (ref 3.8–4.8)
Alkaline Phosphatase: 81 IU/L (ref 44–121)
BUN/Creatinine Ratio: 12 (ref 9–23)
BUN: 9 mg/dL (ref 6–20)
Bilirubin Total: 0.3 mg/dL (ref 0.0–1.2)
CO2: 25 mmol/L (ref 20–29)
Calcium: 10.1 mg/dL (ref 8.7–10.2)
Chloride: 99 mmol/L (ref 96–106)
Creatinine, Ser: 0.78 mg/dL (ref 0.57–1.00)
GFR calc Af Amer: 116 mL/min/{1.73_m2} (ref >59)
GFR calc non Af Amer: 100 mL/min/{1.73_m2} (ref >59)
Globulin, Total: 2.3 g/dL (ref 1.5–4.5)
Glucose: 91 mg/dL (ref 65–99)
Potassium: 4.6 mmol/L (ref 3.5–5.2)
Sodium: 139 mmol/L (ref 134–144)
Total Protein: 7.4 g/dL (ref 6.0–8.5)

## 2020-07-01 LAB — FOOD ALLERGY PROFILE
Allergen Corn, IgE: <0.1 kU/L
Clam IgE: <0.1 kU/L
Codfish IgE: <0.1 kU/L
Egg White IgE: <0.1 kU/L
Milk IgE: <0.1 kU/L
Peanut IgE: <0.1 kU/L
Scallop IgE: <0.1 kU/L
Sesame Seed IgE: <0.1 kU/L
Shrimp IgE: <0.1 kU/L
Soybean IgE: <0.1 kU/L
Walnut IgE: <0.1 kU/L
Wheat IgE: <0.1 kU/L

## 2020-07-01 LAB — VITAMIN B12: Vitamin B-12: 552 pg/mL (ref 232–1245)

## 2020-07-01 LAB — METHYLMALONIC ACID, SERUM: Methylmalonic Acid: 143 nmol/L (ref 0–378)

## 2020-07-01 LAB — ZINC: Zinc: 85 ug/dL (ref 44–115)

## 2020-07-01 LAB — C-REACTIVE PROTEIN: CRP: 3 mg/L (ref 0–10)

## 2020-07-01 LAB — EBV AB TO VIRAL CAPSID AG PNL, IGG+IGM
EBV VCA IgG: >600 U/mL — ABNORMAL HIGH (ref 0.0–17.9)
EBV VCA IgM: <36 U/mL (ref 0.0–35.9)

## 2020-07-01 LAB — HEAVY METALS PROFILE II, BLOOD
Arsenic: 1 ug/L — ABNORMAL LOW (ref 2–23)
Cadmium: <0.5 ug/L (ref 0.0–1.2)
Lead, Blood: <1 ug/dL (ref 0–4)
Mercury: <1 ug/L (ref 0.0–14.9)

## 2020-07-01 LAB — FACTOR 5 LEIDEN

## 2020-07-01 LAB — HEPATITIS B CORE AB W/REFLEX: Hep B Core Total Ab: NEGATIVE

## 2020-07-01 LAB — HEPATITIS C ANTIBODY: Hep C Virus Ab: <0.1 s/co ratio (ref 0.0–0.9)

## 2020-07-01 LAB — VITAMIN B1: Thiamine: 140.8 nmol/L (ref 66.5–200.0)

## 2020-07-01 LAB — CMV IGM: CMV IgM Ser EIA-aCnc: <30 AU/mL (ref 0.0–29.9)

## 2020-07-01 LAB — RSV(RESPIRATORY SYNCYTIAL VIRUS) AB, BLOOD: RSV Ab: 1:16 {titer} — ABNORMAL HIGH

## 2020-07-01 LAB — IRON,TIBC AND FERRITIN PANEL
Ferritin: 32 ng/mL (ref 15–150)
Iron Saturation: 11 % — ABNORMAL LOW (ref 15–55)
Iron: 41 ug/dL (ref 27–159)
Total Iron Binding Capacity: 387 ug/dL (ref 250–450)
UIBC: 346 ug/dL (ref 131–425)

## 2020-07-01 LAB — VITAMIN B6: Vitamin B6: 22.7 ug/L (ref 2.0–32.8)

## 2020-07-01 LAB — PROTIME-INR
INR: 1 (ref 0.9–1.2)
Prothrombin Time: 10.3 s (ref 9.1–12.0)

## 2020-07-01 LAB — TSH: TSH: 1.72 u[IU]/mL (ref 0.450–4.500)

## 2020-07-01 LAB — TRANSFERRIN: Transferrin: 348 mg/dL (ref 192–364)

## 2020-07-01 LAB — BILIRUBIN, DIRECT: Bilirubin, Direct: 0.11 mg/dL (ref 0.00–0.40)

## 2020-07-01 LAB — VITAMIN D 25 HYDROXY (VIT D DEFICIENCY, FRACTURES): Vit D, 25-Hydroxy: 29.6 ng/mL — ABNORMAL LOW (ref 30.0–100.0)

## 2020-07-01 LAB — APTT: aPTT: 29 s (ref 24–33)

## 2020-07-01 LAB — SEDIMENTATION RATE: Sed Rate: 4 mm/hr (ref 0–32)

## 2020-07-01 LAB — BRAIN NATRIURETIC PEPTIDE: BNP: <2.5 pg/mL (ref 0.0–100.0)

## 2020-07-01 LAB — PARATHYROID HORMONE, INTACT (NO CA): PTH: 29 pg/mL (ref 15–65)

## 2020-07-01 NOTE — Progress Notes (Signed)
Factor V Leiden negative.  Allergy food panel negative.

## 2020-07-04 ENCOUNTER — Other Ambulatory Visit: Payer: Self-pay | Admitting: Adult Health

## 2020-07-08 ENCOUNTER — Other Ambulatory Visit: Payer: Self-pay | Admitting: Adult Health

## 2020-07-08 LAB — URINALYSIS, MICROSCOPIC ONLY
Casts: NONE SEEN /lpf
Epithelial Cells (non renal): >10 /hpf — AB (ref 0–10)
RBC, Urine: NONE SEEN /hpf (ref 0–2)

## 2020-07-08 MED ORDER — DOXYCYCLINE HYCLATE 100 MG PO TABS: 100.0000 mg | ORAL_TABLET | Freq: Two times a day (BID) | ORAL | 0 refills | Status: DC

## 2020-07-08 NOTE — Progress Notes (Signed)
Crystals present in urine consistent with calcium oxalate- decrease calcium, vitamin C , and avoid high protein diet, few bacteria, will wait on culture pending.

## 2020-07-09 ENCOUNTER — Other Ambulatory Visit: Payer: Self-pay | Admitting: Adult Health

## 2020-07-09 DIAGNOSIS — N390 Urinary tract infection, site not specified: Secondary | ICD-10-CM

## 2020-07-09 DIAGNOSIS — B379 Candidiasis, unspecified: Secondary | ICD-10-CM

## 2020-07-09 MED ORDER — LEVOFLOXACIN 750 MG PO TABS: 750.0000 mg | ORAL_TABLET | Freq: Every day | ORAL | 0 refills | Status: AC

## 2020-07-09 MED ORDER — FLUCONAZOLE 150 MG PO TABS: 150.0000 mg | ORAL_TABLET | ORAL | 0 refills | Status: DC

## 2020-07-09 MED ORDER — PHENAZOPYRIDINE HCL 200 MG PO TABS: 200.0000 mg | ORAL_TABLET | Freq: Three times a day (TID) | ORAL | 0 refills | Status: DC | PRN

## 2020-07-09 NOTE — Progress Notes (Signed)
Urine preliminary has 3 bacteria - given history, sent Levaquin, diflucan and pyridium. Needs to have urine rechecked 2 weeks after taking last antibiotic. Follow up office visit if symptoms persist or worsen at anytime.

## 2020-07-09 NOTE — Progress Notes (Signed)
Meds ordered this encounter  Medications  . levofloxacin (LEVAQUIN) 750 MG tablet    Sig: Take 1 tablet (750 mg total) by mouth daily for 7 days.    Dispense:  7 tablet    Refill:  0  . fluconazole (DIFLUCAN) 150 MG tablet    Sig: Take 1 tablet (150 mg total) by mouth as directed. Take one tablet by mouth on day 1. May repeat dose of one tablet by mouth on day four.    Dispense:  2 tablet    Refill:  0  . phenazopyridine (PYRIDIUM) 200 MG tablet    Sig: Take 1 tablet (200 mg total) by mouth 3 (three) times daily as needed for pain (for two days only PRN will change urine color for bladder irritation.).    Dispense:  10 tablet    Refill:  0  per recheck urine culture.

## 2020-07-10 ENCOUNTER — Other Ambulatory Visit: Payer: Self-pay | Admitting: Adult Health

## 2020-07-10 LAB — CULTURE, URINE COMPREHENSIVE

## 2020-07-10 NOTE — Progress Notes (Signed)
Final urine culture report no change.

## 2020-08-03 ENCOUNTER — Other Ambulatory Visit: Payer: Self-pay | Admitting: Adult Health

## 2020-08-03 NOTE — Telephone Encounter (Signed)
Requested medication (s) are due for refill today: Yes  Requested medication (s) are on the active medication list: No  Last refill:  03/02/20  Future visit scheduled: No  Notes to clinic:  See request.    Requested Prescriptions  Pending Prescriptions Disp Refills   hydrOXYzine (ATARAX/VISTARIL) 50 MG tablet [Pharmacy Med Name: HYDROXYZINE HCL 50MG  TABS (WHITE)] 90 tablet 0    Sig: TAKE ONE TABLET BY MOUTH EVERY 8 HOURS AS NEEDED FOR ITCHING, WILL CAUSE DROWSINESS      Ear, Nose, and Throat:  Antihistamines Passed - 08/03/2020  1:31 PM      Passed - Valid encounter within last 12 months    Recent Outpatient Visits           2 months ago Obstructive sleep apnea syndrome   [facility] [place], Kelby Aline, FNP   3 months ago Pain of left calf   [facility] Fl[place]um, Kelby Aline, FNP   3 months ago Nausea   [name], Kelby Aline, FNP   4 months ago Cluster headache, not intractable, unspecified chronicity pattern   [name], Kelby Aline, FNP   5 months ago Insomnia, unspecified type   [facility] [place], Kelby Aline, FNP

## 2020-08-04 NOTE — Telephone Encounter (Signed)
Looks like this was discontinued on 03/04/20 but I can't tell if it was replaced with something else

## 2020-08-08 ENCOUNTER — Other Ambulatory Visit: Payer: Self-pay | Admitting: Adult Health

## 2020-08-08 DIAGNOSIS — N390 Urinary tract infection, site not specified: Secondary | ICD-10-CM

## 2020-08-09 ENCOUNTER — Encounter: Payer: Self-pay | Admitting: Adult Health

## 2020-08-09 LAB — CBC WITH DIFFERENTIAL/PLATELET
Basophils Absolute: 0.1 10*3/uL (ref 0.0–0.2)
Basos: 1 %
EOS (ABSOLUTE): 0.3 10*3/uL (ref 0.0–0.4)
Eos: 3 %
Hematocrit: 38.7 % (ref 34.0–46.6)
Hemoglobin: 12.8 g/dL (ref 11.1–15.9)
Immature Grans (Abs): 0 10*3/uL (ref 0.0–0.1)
Immature Granulocytes: 0 %
Lymphocytes Absolute: 3.8 10*3/uL — ABNORMAL HIGH (ref 0.7–3.1)
Lymphs: 36 %
MCH: 26.9 pg (ref 26.6–33.0)
MCHC: 33.1 g/dL (ref 31.5–35.7)
MCV: 81 fL (ref 79–97)
Monocytes Absolute: 0.7 10*3/uL (ref 0.1–0.9)
Monocytes: 7 %
Neutrophils Absolute: 5.8 10*3/uL (ref 1.4–7.0)
Neutrophils: 53 %
Platelets: 573 10*3/uL — ABNORMAL HIGH (ref 150–450)
RBC: 4.76 x10E6/uL (ref 3.77–5.28)
RDW: 12.4 % (ref 11.7–15.4)
WBC: 10.8 10*3/uL (ref 3.4–10.8)

## 2020-08-09 LAB — URINALYSIS, MICROSCOPIC ONLY
Casts: NONE SEEN /lpf
Epithelial Cells (non renal): >10 /hpf — AB (ref 0–10)

## 2020-08-11 ENCOUNTER — Other Ambulatory Visit: Payer: Self-pay | Admitting: Adult Health

## 2020-08-11 ENCOUNTER — Encounter: Payer: Self-pay | Admitting: Adult Health

## 2020-08-11 DIAGNOSIS — L819 Disorder of pigmentation, unspecified: Secondary | ICD-10-CM

## 2020-08-11 LAB — CULTURE, URINE COMPREHENSIVE

## 2020-08-11 NOTE — Progress Notes (Signed)
Orders Placed This Encounter  Procedures  . Ambulatory referral to Dermatology    Referral Priority:   Urgent    Referral Type:   Consultation    Referral Reason:   Specialty Services Required    Referred to Provider:   Gala Romney, MD    Requested Specialty:   Dermatology    Number of Visits Requested:   1

## 2020-08-11 NOTE — Progress Notes (Signed)
Urine culture shows routine normal flora, no treatment indicated if patient is asymptomatic.

## 2020-08-11 NOTE — Progress Notes (Signed)
Platelets stable and still elevated. 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  Can refer again of patient would like.  Urine culture is pending follow up.

## 2020-08-14 ENCOUNTER — Other Ambulatory Visit: Payer: Self-pay | Admitting: Adult Health

## 2020-08-14 DIAGNOSIS — R8271 Bacteriuria: Secondary | ICD-10-CM

## 2020-08-14 DIAGNOSIS — R5382 Chronic fatigue, unspecified: Secondary | ICD-10-CM

## 2020-08-14 DIAGNOSIS — R7989 Other specified abnormal findings of blood chemistry: Secondary | ICD-10-CM

## 2020-08-14 NOTE — Progress Notes (Addendum)
Recheck urine

## 2020-08-15 NOTE — Addendum Note (Signed)
Addended by: Doreen Beam on: 08/15/2020 10:32 AM   Modules accepted: Orders

## 2020-08-18 ENCOUNTER — Other Ambulatory Visit: Payer: Self-pay | Admitting: Adult Health

## 2020-08-18 NOTE — Progress Notes (Signed)
Mixed urogenital flora on preliminary culture- will wait for final culture.

## 2020-08-18 NOTE — Telephone Encounter (Signed)
Requested medication (s) are due for refill today: yes  Requested medication (s) are on the active medication list: yes  Last refill:  06/04/20  Future visit scheduled: no  Notes to clinic:  not delegated    Requested Prescriptions  Pending Prescriptions Disp Refills   LORazepam (ATIVAN) 1 MG tablet [Pharmacy Med Name: LORAZEPAM 1MG  TABLETS] 90 tablet     Sig: TAKE 1 TABLET(1 MG) BY MOUTH EVERY 8 HOURS AS NEEDED FOR ANXIETY      Not Delegated - Psychiatry:  Anxiolytics/Hypnotics Failed - 08/18/2020 10:08 AM      Failed - This refill cannot be delegated      Failed - Urine Drug Screen completed in last 360 days      Passed - Valid encounter within last 6 months    Recent Outpatient Visits           2 months ago Obstructive sleep apnea syndrome   [name] [place], Ashley Aline, FNP   3 months ago Pain of left calf   [facility] [place], Ashley Aline, FNP   4 months ago Nausea   [name], Ashley Aline, FNP   4 months ago Cluster headache, not intractable, unspecified chronicity pattern   [name], Ashley Aline, FNP   5 months ago Insomnia, unspecified type   [facility] [place], Ashley Aline, FNP

## 2020-08-19 LAB — CULTURE, URINE COMPREHENSIVE

## 2020-08-19 NOTE — Progress Notes (Signed)
Mixed urogenital flora, usually normal and  no treatment advised if asymptomatic.

## 2020-08-21 ENCOUNTER — Encounter: Payer: Self-pay | Admitting: Adult Health

## 2020-08-21 DIAGNOSIS — N390 Urinary tract infection, site not specified: Secondary | ICD-10-CM

## 2020-08-21 DIAGNOSIS — T3695XA Adverse effect of unspecified systemic antibiotic, initial encounter: Secondary | ICD-10-CM

## 2020-08-21 DIAGNOSIS — B379 Candidiasis, unspecified: Secondary | ICD-10-CM

## 2020-08-22 ENCOUNTER — Other Ambulatory Visit: Payer: Self-pay | Admitting: Adult Health

## 2020-08-22 DIAGNOSIS — G43109 Migraine with aura, not intractable, without status migrainosus: Secondary | ICD-10-CM

## 2020-08-22 MED ORDER — FLUCONAZOLE 150 MG PO TABS: 150.0000 mg | ORAL_TABLET | ORAL | 0 refills | Status: DC

## 2020-08-22 MED ORDER — SULFAMETHOXAZOLE-TRIMETHOPRIM 800-160 MG PO TABS: 1.0000 | ORAL_TABLET | Freq: Two times a day (BID) | ORAL | 0 refills | Status: DC

## 2020-08-22 MED ORDER — ONDANSETRON 8 MG PO TBDP: 8.0000 mg | ORAL_TABLET | Freq: Three times a day (TID) | ORAL | 0 refills | Status: DC | PRN

## 2020-09-23 ENCOUNTER — Other Ambulatory Visit: Payer: Self-pay | Admitting: Adult Health

## 2020-09-23 ENCOUNTER — Telehealth: Payer: Self-pay

## 2020-09-23 LAB — CBC WITH DIFFERENTIAL/PLATELET
Basophils Absolute: 0.1 10*3/uL (ref 0.0–0.2)
Basos: 1 %
EOS (ABSOLUTE): 0.2 10*3/uL (ref 0.0–0.4)
Eos: 2 %
Hematocrit: 40.4 % (ref 34.0–46.6)
Hemoglobin: 12.8 g/dL (ref 11.1–15.9)
Immature Grans (Abs): 0 10*3/uL (ref 0.0–0.1)
Immature Granulocytes: 0 %
Lymphocytes Absolute: 2.9 10*3/uL (ref 0.7–3.1)
Lymphs: 31 %
MCH: 26 pg — ABNORMAL LOW (ref 26.6–33.0)
MCHC: 31.7 g/dL (ref 31.5–35.7)
MCV: 82 fL (ref 79–97)
Monocytes Absolute: 0.8 10*3/uL (ref 0.1–0.9)
Monocytes: 8 %
Neutrophils Absolute: 5.5 10*3/uL (ref 1.4–7.0)
Neutrophils: 58 %
Platelets: 531 10*3/uL — ABNORMAL HIGH (ref 150–450)
RBC: 4.93 x10E6/uL (ref 3.77–5.28)
RDW: 13.2 % (ref 11.7–15.4)
WBC: 9.4 10*3/uL (ref 3.4–10.8)

## 2020-09-23 LAB — IRON,TIBC AND FERRITIN PANEL
Ferritin: 17 ng/mL (ref 15–150)
Iron Saturation: 6 % — CL (ref 15–55)
Iron: 26 ug/dL — ABNORMAL LOW (ref 27–159)
Total Iron Binding Capacity: 403 ug/dL (ref 250–450)
UIBC: 377 ug/dL (ref 131–425)

## 2020-09-23 MED ORDER — FEROSUL 325 (65 FE) MG PO TABS: 325.0000 mg | ORAL_TABLET | ORAL | 0 refills | Status: DC

## 2020-09-23 NOTE — Progress Notes (Signed)
Meds ordered this encounter  Medications  . FEROSUL 325 (65 Fe) MG tablet    Sig: Take 1 tablet (325 mg total) by mouth every other day.    Dispense:  90 tablet    Refill:  0

## 2020-09-23 NOTE — Telephone Encounter (Signed)
Provider called [facility] and spoke with [place] technician, clarification needed was if ok for iron to be generic. Ok given and order clarified. No further action needed. DONE.

## 2020-09-23 NOTE — Progress Notes (Signed)
Iron level is low again, could be causing platelets to remain elevated although they are chronic but improved stable. Would recommend restarting iron as below and returning for labs female panel and iron tibc ferritin and vitamin d please add lab orders. one week prior to office visit in 3 months. Please call patient to schedule this.  Meds ordered this encounter Medications  FEROSUL 325 (65 Fe) MG tablet   Sig: Take 1 tablet (325 mg total) by mouth every other day.   Dispense:  90 tablet   Refill:  0

## 2020-09-23 NOTE — Telephone Encounter (Signed)
Copied from [place] [id number]. Topic: Quick Communication - Rx Refill/Question >> Sep 23, 2020 11:40 AM Erick Blinks wrote: Dorrene German called from [place], [place] [address] [address] [place] [address] Phone: [phone] Fax: [phone]  Needing clarification on today's iron Rx, call back request

## 2020-09-25 ENCOUNTER — Encounter: Payer: Self-pay | Admitting: Adult Health

## 2020-09-25 ENCOUNTER — Other Ambulatory Visit: Payer: Self-pay | Admitting: Adult Health

## 2020-09-25 DIAGNOSIS — B951 Streptococcus, group B, as the cause of diseases classified elsewhere: Secondary | ICD-10-CM

## 2020-09-25 DIAGNOSIS — B955 Unspecified streptococcus as the cause of diseases classified elsewhere: Secondary | ICD-10-CM

## 2020-09-25 LAB — CULTURE, URINE COMPREHENSIVE

## 2020-09-25 MED ORDER — LEVOFLOXACIN 750 MG PO TABS: 750.0000 mg | ORAL_TABLET | Freq: Every day | ORAL | 0 refills | Status: AC

## 2020-09-25 NOTE — Telephone Encounter (Signed)
See previous note sent 09/25/20 at 844 am.

## 2020-09-25 NOTE — Telephone Encounter (Signed)
Meds ordered this encounter Medications  levofloxacin (LEVAQUIN) 750 MG tablet   Sig: Take 1 tablet (750 mg total) by mouth daily for 5 days.   Dispense:  5 tablet   Refill:  0   Levaquin sent to pharmacy take as directed. Will recheck urine in 3 months at follow up unless any symptoms occur prior.[name]

## 2020-09-25 NOTE — Progress Notes (Signed)
Recent Results (from the past 2160 hour(s))  Urinalysis, microscopic only     Status: Abnormal   Collection Time: 07/07/20  3:43 PM  Result Value Ref Range   WBC, UA 0-5 0 - 5 /hpf   RBC None seen 0 - 2 /hpf   Epithelial Cells (non renal) >10 (A) 0 - 10 /hpf   Casts None seen None seen /lpf   Crystals Present (A) N/A   Crystal Type Calcium Oxalate N/A   Bacteria, UA Few None seen/Few  CULTURE, URINE COMPREHENSIVE     Status: None   Collection Time: 07/07/20  3:43 PM   Specimen: Urine   UC  Result Value Ref Range   Urine Culture, Comprehensive Final report    Organism ID, Bacteria Comment     Comment: More than 3 organisms recovered, none predominant. Please submit another culture if clinically indicated. 50,000-100,000 colony forming units per mL   CBC with Differential/Platelet     Status: Abnormal   Collection Time: 08/08/20  4:21 PM  Result Value Ref Range   WBC 10.8 3.4 - 10.8 x10E3/uL   RBC 4.76 3.77 - 5.28 x10E6/uL   Hemoglobin 12.8 11.1 - 15.9 g/dL   Hematocrit 38.7 34.0 - 46.6 %   MCV 81 79 - 97 fL   MCH 26.9 26.6 - 33.0 pg   MCHC 33.1 31.5 - 35.7 g/dL   RDW 12.4 11.7 - 15.4 %   Platelets 573 (H) 150 - 450 x10E3/uL   Neutrophils 53 Not Estab. %   Lymphs 36 Not Estab. %   Monocytes 7 Not Estab. %   Eos 3 Not Estab. %   Basos 1 Not Estab. %   Neutrophils Absolute 5.8 1.4 - 7.0 x10E3/uL   Lymphocytes Absolute 3.8 (H) 0.7 - 3.1 x10E3/uL   Monocytes Absolute 0.7 0.1 - 0.9 x10E3/uL   EOS (ABSOLUTE) 0.3 0.0 - 0.4 x10E3/uL   Basophils Absolute 0.1 0.0 - 0.2 x10E3/uL   Immature Granulocytes 0 Not Estab. %   Immature Grans (Abs) 0.0 0.0 - 0.1 x10E3/uL  CULTURE, URINE COMPREHENSIVE     Status: None   Collection Time: 08/08/20  4:22 PM   Specimen: Urine   UC  Result Value Ref Range   Urine Culture, Comprehensive Final report    Organism ID, Bacteria Comment     Comment: Mixed urogenital flora 10,000-25,000 colony forming units per mL   Urinalysis, microscopic only      Status: Abnormal   Collection Time: 08/08/20  4:23 PM  Result Value Ref Range   WBC, UA 0-5 0 - 5 /hpf   RBC 0-2 0 - 2 /hpf   Epithelial Cells (non renal) >10 (A) 0 - 10 /hpf   Casts None seen None seen /lpf   Bacteria, UA Moderate (A) None seen/Few  CULTURE, URINE COMPREHENSIVE     Status: None   Collection Time: 08/15/20 10:35 AM   Specimen: Urine   UC  Result Value Ref Range   Urine Culture, Comprehensive Final report    Organism ID, Bacteria Comment     Comment: Mixed urogenital flora 25,000-50,000 colony forming units per mL   CBC with Differential/Platelet     Status: Abnormal   Collection Time: 09/22/20 12:06 PM  Result Value Ref Range   WBC 9.4 3.4 - 10.8 x10E3/uL   RBC 4.93 3.77 - 5.28 x10E6/uL   Hemoglobin 12.8 11.1 - 15.9 g/dL   Hematocrit 40.4 34.0 - 46.6 %   MCV 82 79 - 97  fL   MCH 26.0 (L) 26.6 - 33.0 pg   MCHC 31.7 31.5 - 35.7 g/dL   RDW 13.2 11.7 - 15.4 %   Platelets 531 (H) 150 - 450 x10E3/uL   Neutrophils 58 Not Estab. %   Lymphs 31 Not Estab. %   Monocytes 8 Not Estab. %   Eos 2 Not Estab. %   Basos 1 Not Estab. %   Neutrophils Absolute 5.5 1.4 - 7.0 x10E3/uL   Lymphocytes Absolute 2.9 0.7 - 3.1 x10E3/uL   Monocytes Absolute 0.8 0.1 - 0.9 x10E3/uL   EOS (ABSOLUTE) 0.2 0.0 - 0.4 x10E3/uL   Basophils Absolute 0.1 0.0 - 0.2 x10E3/uL   Immature Granulocytes 0 Not Estab. %   Immature Grans (Abs) 0.0 0.0 - 0.1 x10E3/uL  Iron, TIBC and Ferritin Panel     Status: Abnormal   Collection Time: 09/22/20 12:06 PM  Result Value Ref Range   Total Iron Binding Capacity 403 250 - 450 ug/dL   UIBC 377 131 - 425 ug/dL   Iron 26 (L) 27 - 159 ug/dL   Iron Saturation 6 (LL) 15 - 55 %   Ferritin 17 15 - 150 ng/mL  CULTURE, URINE COMPREHENSIVE     Status: Abnormal (Preliminary result)   Collection Time: 09/22/20 12:09 PM   Specimen: Urine   UC  Result Value Ref Range   Urine Culture, Comprehensive Preliminary report (A)    Organism ID, Bacteria Comment (A)      Comment: Beta hemolytic Streptococcus, group B 50,000-100,000 colony forming units per mL Penicillin and ampicillin are drugs of choice for treatment of beta-hemolytic streptococcal infections. Susceptibility testing of penicillins and other beta-lactam agents approved by the FDA for treatment of beta-hemolytic streptococcal infections need not be performed routinely because nonsusceptible isolates are extremely rare in any beta-hemolytic streptococcus and have not been reported for Streptococcus pyogenes (group A). (CLSI)    Organism ID, Bacteria Comment     Comment: Mixed urogenital flora 50,000-100,000 colony forming units per mL     Meds ordered this encounter  Medications  . levofloxacin (LEVAQUIN) 750 MG tablet    Sig: Take 1 tablet (750 mg total) by mouth daily for 5 days.    Dispense:  5 tablet    Refill:  0

## 2020-10-09 ENCOUNTER — Other Ambulatory Visit: Payer: Self-pay | Admitting: Adult Health

## 2020-10-09 ENCOUNTER — Encounter: Payer: Self-pay | Admitting: Adult Health

## 2020-10-09 ENCOUNTER — Telehealth (INDEPENDENT_AMBULATORY_CARE_PROVIDER_SITE_OTHER): Payer: 59 | Admitting: Adult Health

## 2020-10-09 VITALS — Ht 63.0 in | Wt 185.0 lb

## 2020-10-09 DIAGNOSIS — E611 Iron deficiency: Secondary | ICD-10-CM

## 2020-10-09 DIAGNOSIS — R21 Rash and other nonspecific skin eruption: Secondary | ICD-10-CM

## 2020-10-09 DIAGNOSIS — J4540 Moderate persistent asthma, uncomplicated: Secondary | ICD-10-CM

## 2020-10-09 DIAGNOSIS — R52 Pain, unspecified: Secondary | ICD-10-CM | POA: Insufficient documentation

## 2020-10-09 DIAGNOSIS — G43109 Migraine with aura, not intractable, without status migrainosus: Secondary | ICD-10-CM

## 2020-10-09 DIAGNOSIS — F419 Anxiety disorder, unspecified: Secondary | ICD-10-CM

## 2020-10-09 DIAGNOSIS — E559 Vitamin D deficiency, unspecified: Secondary | ICD-10-CM

## 2020-10-09 DIAGNOSIS — M2550 Pain in unspecified joint: Secondary | ICD-10-CM

## 2020-10-09 DIAGNOSIS — R6883 Chills (without fever): Secondary | ICD-10-CM

## 2020-10-09 DIAGNOSIS — R062 Wheezing: Secondary | ICD-10-CM

## 2020-10-09 DIAGNOSIS — R5383 Other fatigue: Secondary | ICD-10-CM

## 2020-10-09 DIAGNOSIS — L237 Allergic contact dermatitis due to plants, except food: Secondary | ICD-10-CM

## 2020-10-09 DIAGNOSIS — N920 Excessive and frequent menstruation with regular cycle: Secondary | ICD-10-CM

## 2020-10-09 DIAGNOSIS — R509 Fever, unspecified: Secondary | ICD-10-CM

## 2020-10-09 MED ORDER — FLUCONAZOLE 150 MG PO TABS: 150.0000 mg | ORAL_TABLET | ORAL | 0 refills | Status: DC

## 2020-10-09 MED ORDER — SULFAMETHOXAZOLE-TRIMETHOPRIM 800-160 MG PO TABS: 1.0000 | ORAL_TABLET | Freq: Two times a day (BID) | ORAL | 0 refills | Status: DC

## 2020-10-09 MED ORDER — ONDANSETRON 8 MG PO TBDP
8.0000 mg | ORAL_TABLET | Freq: Three times a day (TID) | ORAL | 0 refills | Status: DC | PRN
Start: 2020-10-09 — End: 2021-04-22

## 2020-10-09 MED ORDER — METHYLPREDNISOLONE 4 MG PO TBPK: ORAL_TABLET | ORAL | 0 refills | Status: DC

## 2020-10-09 NOTE — Progress Notes (Signed)
Virtual Visit via Video Note  I connected with AMRITHA YORKE on 10/09/20 at  4:30 PM EDT by a video enabled telemedicine application and verified that I am speaking with the correct person using two identifiers. Parties involved in visit as below:   Location: Patient: at home  Provider: Provider: Provider's office at  [facility], [place].      I discussed the limitations of evaluation and management by telemedicine and the availability of in person appointments. The patient expressed understanding and agreed to proceed.  History of Present Illness: Patient is a 35 year old female in no acute distress who comes to the office today for follow up she is experiencing continued fatigue that has worsened in the past 2 weeks.    Recently treated for urinary tract infection with Levaquin has completed dosage.  She recently started back on iron supplements due to low iron. She has a chronic history of thrombocytosis.  She is still experiencing extreme fatigue, weakness and tiredness.  Denied any known tick bite [place]. Has chronic nausea.  History of poison oak contact. As well as non specific skin disruptions at time questionable hives.  Has had heavy menses the past few months, has history of anemia. Seeing obgyn. Has been consulted with [facility] and nasuea felt to be chronic takes Zofran prn chronically.  Muscle aches and body aches have been extreme. Mild shortness of breath with increasing activity comes intermittently. Denies chest pain has felt a few random palpitations when laying down at night. No productive cough.   Patient  denies any fever,chills, rash, chest pain,  nausea, vomiting, or diarrhea.    Denies dizziness, lightheadedness, pre syncopal or syncopal episodes.    Observations/Objective:   Patient is alert and oriented and responsive to questions Engages in conversation with provider. Speaks  in full sentences without any pauses without any shortness of breath or distress.  Assessment and Plan:  The primary encounter diagnosis was Arthralgia, unspecified joint. Diagnoses of Body aches, Migraine with aura and without status migrainosus, not intractable, Iron deficiency, Chills, Menorrhagia with regular cycle, Vitamin D deficiency, Other fatigue, Rash and nonspecific skin eruption, Wheezing, and Asthma, allergic, moderate persistent, uncomplicated were also pertinent to this visit.  Chronic refills given as well, she will go for labs and ordered testing.  Meds ordered this encounter  Medications  . sulfamethoxazole-trimethoprim (BACTRIM DS) 800-160 MG tablet    Sig: Take 1 tablet by mouth 2 (two) times daily. Take with food.    Dispense:  20 tablet    Refill:  0  . methylPREDNISolone (MEDROL DOSEPAK) 4 MG TBPK tablet    Sig: PO: Take 6 tablets on day 1:Take 5 tablets day 2:Take 4 tablets day 3: Take 3 tablets day 4:Take 2 tablets day five: 5 Take 1 tablet day 6    Dispense:  21 tablet    Refill:  0  . ondansetron (ZOFRAN-ODT) 8 MG disintegrating tablet    Sig: Take 1 tablet (8 mg total) by mouth every 8 (eight) hours as needed for nausea or vomiting. Take as needed/ directed only.    Dispense:  20 tablet    Refill:  0  . fluconazole (DIFLUCAN) 150 MG tablet    Sig: Take 1 tablet (150 mg total) by mouth as directed. If needed for antibiotic induced yeast.Take one tablet by mouth on day 1. May repeat dose of one tablet by mouth on day four.  Dispense:  2 tablet    Refill:  0  . nystatin ointment (MYCOSTATIN)    Sig: Apply 1 application topically 2 (two) times daily. For fungal rash not for internal use.    Dispense:  30 g    Refill:  1  . triamcinolone cream (KENALOG) 0.1 %    Sig: Apply 1 application topically 2 (two) times daily. Use thin layer only not for genitals or face for extreme irritation    Dispense:  80 g    Refill:  1  . Tiotropium Bromide Monohydrate (SPIRIVA  RESPIMAT) 1.25 MCG/ACT AERS    Sig: Inhale 2 puffs into the lungs daily.    Dispense:  4 g    Refill:  3   Follow Up Instructions: Return in about 2 weeks (around 10/23/2020), or if symptoms worsen or fail to improve, for at any time for any worsening symptoms, Go to Emergency room/ urgent care if worse.  In person visit advised if any symptoms at worsen or persist at anytime.  I discussed the assessment and treatment plan with the patient. The patient was provided an opportunity to ask questions and all were answered. The patient agreed with the plan and demonstrated an understanding of the instructions.   The patient was advised to call back or seek an in-person evaluation if the symptoms worsen or if the condition fails to improve as anticipated.    Marcille Buffy, FNP

## 2020-10-09 NOTE — Patient Instructions (Signed)
Joint Pain  Joint pain can be caused by many things. It is likely to go away if you follow instructions from your doctor for taking care of yourself at home. Sometimes, you may need more treatment. Follow these instructions at home: Managing pain, stiffness, and swelling  If told, put ice on the painful area. To do this: ? If you have a removable elastic bandage, sling, or splint, take it off as told by your doctor. ? Put ice in a plastic bag. ? Place a towel between your skin and the bag. ? Leave the ice on for 20 minutes, 2-3 times a day. ? Take off the ice if your skin turns bright red. This is very important. If you cannot feel pain, heat, or cold, you have a greater risk of damage to the area.  Move your fingers or toes below the painful joint often.  Raise the painful joint above the level of your heart while you are sitting or lying down.  If told, put heat on the painful area. Do this as often as told by your doctor. Use the heat source that your doctor recommends, such as a moist heat pack or a heating pad. ? Place a towel between your skin and the heat source. ? Leave the heat on for 20-30 minutes. ? Take off the heat if your skin gets bright red. This is especially important if you are unable to feel pain, heat, or cold. You may have a greater risk of getting burned.      Activity  Rest the painful joint for as long as told by your doctor. Do not do things that cause pain or make your pain worse.  Begin exercising or stretching the affected area, as told by your doctor. Ask your doctor what types of exercise are safe for you.  Return to your normal activities when your doctor says that it is safe. If you have an elastic bandage, sling, or splint:  Wear it as told by your doctor. Take it only as told by your doctor.  Loosen it your fingers or toes below the joint: ? Tingle. ? Become numb. ? Get cold and blue.  Keep it clean.  Ask your doctor if you should take it  off before bathing.  If it is not waterproof: ? Do not let it get wet. ? Cover it with a watertight covering when you take a bath or shower. General instructions  Take over-the-counter and prescription medicines only as told by your doctor. This may include medicines taken by mouth or applied to the skin.  Do not smoke or use any products that contain nicotine or tobacco. If you need help quitting, ask your doctor.  Keep all follow-up visits as told by your doctor. This is important. Contact a doctor if:  You have pain that gets worse and does not get better with medicine.  Your joint pain does not get better in 3 days.  You have more bruising or swelling.  You have a fever.  You lose 10 lb (4.5 kg) or more without trying. Get help right away if:  You cannot move the joint.  Your fingers or toes tingle, become numb. or get cold and blue.  You have a fever along with a joint that is red, warm, and swollen. Summary  Joint pain can be caused by many things. It often goes away if you follow instructions from your doctor for taking care of yourself at home.  Rest the painful joint   for as long as told. Do not do things that cause pain or make your pain worse.  Take over-the-counter and prescription medicines only as told by your doctor. This information is not intended to replace advice given to you by your health care provider. Make sure you discuss any questions you have with your health care provider. Document Revised: 09/12/2019 Document Reviewed: 09/12/2019 Elsevier Patient Education  2021 [employer]. Urinary Tract Infection, Adult A urinary tract infection (UTI) is an infection of any part of the urinary tract. The urinary tract includes:  The kidneys.  The ureters.  The bladder.  The urethra. These organs make, store, and get rid of pee (urine) in the body. What are the causes? This infection is caused by germs (bacteria) in your genital area. These germs grow  and cause swelling (inflammation) of your urinary tract. What increases the risk? The following factors may make you more likely to develop this condition:  Using a small, thin tube (catheter) to drain pee.  Not being able to control when you pee or poop (incontinence).  Being female. If you are female, these things can increase the risk: ? Using these methods to prevent pregnancy:  A medicine that kills sperm (spermicide).  A device that blocks sperm (diaphragm). ? Having low levels of a female hormone (estrogen). ? Being pregnant. You are more likely to develop this condition if:  You have genes that add to your risk.  You are sexually active.  You take antibiotic medicines.  You have trouble peeing because of: ? A prostate that is bigger than normal, if you are female. ? A blockage in the part of your body that drains pee from the bladder. ? A kidney stone. ? A nerve condition that affects your bladder. ? Not getting enough to drink. ? Not peeing often enough.  You have other conditions, such as: ? Diabetes. ? A weak disease-fighting system (immune system). ? Sickle cell disease. ? Gout. ? Injury of the spine. What are the signs or symptoms? Symptoms of this condition include:  Needing to pee right away.  Peeing small amounts often.  Pain or burning when peeing.  Blood in the pee.  Pee that smells bad or not like normal.  Trouble peeing.  Pee that is cloudy.  Fluid coming from the vagina, if you are female.  Pain in the belly or lower back. Other symptoms include:  Vomiting.  Not feeling hungry.  Feeling mixed up (confused). This may be the first symptom in older adults.  Being tired and grouchy (irritable).  A fever.  Watery poop (diarrhea). How is this treated?  Taking antibiotic medicine.  Taking other medicines.  Drinking enough water. In some cases, you may need to see a specialist. Follow these instructions at  home: Medicines  Take over-the-counter and prescription medicines only as told by your doctor.  If you were prescribed an antibiotic medicine, take it as told by your doctor. Do not stop taking it even if you start to feel better. General instructions  Make sure you: ? Pee until your bladder is empty. ? Do not hold pee for a long time. ? Empty your bladder after sex. ? Wipe from front to back after peeing or pooping if you are a female. Use each tissue one time when you wipe.  Drink enough fluid to keep your pee pale yellow.  Keep all follow-up visits.   Contact a doctor if:  You do not get better after 1-2 days.  Your symptoms go away and then come back. Get help right away if:  You have very bad back pain.  You have very bad pain in your lower belly.  You have a fever.  You have chills.  You feeling like you will vomit or you vomit. Summary  A urinary tract infection (UTI) is an infection of any part of the urinary tract.  This condition is caused by germs in your genital area.  There are many risk factors for a UTI.  Treatment includes antibiotic medicines.  Drink enough fluid to keep your pee pale yellow. This information is not intended to replace advice given to you by your health care provider. Make sure you discuss any questions you have with your health care provider. Document Revised: 01/11/2020 Document Reviewed: 01/11/2020 Elsevier Patient Education  2021 [employer]. Methylprednisolone tablets What is this medicine? METHYLPREDNISOLONE (meth ill pred NISS oh lone) is a corticosteroid. It is commonly used to treat inflammation of the skin, joints, lungs, and other organs. Common conditions treated include asthma, allergies, and arthritis. It is also used for other conditions, such as blood disorders and diseases of the adrenal glands. This medicine may be used for other purposes; ask your health care provider or pharmacist if you have questions. COMMON  BRAND NAME(S): Medrol, Medrol Dosepak What should I tell my health care provider before I take this medicine? They need to know if you have any of these conditions:  Cushing's syndrome  eye disease, vision problems  diabetes  glaucoma  heart disease  high blood pressure  infection (especially a virus infection such as chickenpox, cold sores, or herpes)  liver disease  mental illness  myasthenia gravis  osteoporosis  recently received or scheduled to receive a vaccine  seizures  stomach or intestine problems  thyroid disease  an unusual or allergic reaction to lactose, methylprednisolone, other medicines, foods, dyes, or preservatives  pregnant or trying to get pregnant  breast-feeding How should I use this medicine? Take this medicine by mouth with a glass of water. Follow the directions on the prescription label. Take this medicine with food. If you are taking this medicine once a day, take it in the morning. Do not take it more often than directed. Do not suddenly stop taking your medicine because you may develop a severe reaction. Your doctor will tell you how much medicine to take. If your doctor wants you to stop the medicine, the dose may be slowly lowered over time to avoid any side effects. Talk to your pediatrician regarding the use of this medicine in children. Special care may be needed. Overdosage: If you think you have taken too much of this medicine contact a poison control center or emergency room at once. NOTE: This medicine is only for you. Do not share this medicine with others. What if I miss a dose? If you miss a dose, take it as soon as you can. If it is almost time for your next dose, talk to your doctor or health care professional. You may need to miss a dose or take an extra dose. Do not take double or extra doses without advice. What may interact with this medicine? Do not take this medicine with any of the following  medications:  alefacept  echinacea  live virus vaccines  metyrapone  mifepristone This medicine may also interact with the following medications:  amphotericin B  aspirin and aspirin-like medicines  certain antibiotics like erythromycin, clarithromycin, troleandomycin  certain medicines for diabetes  certain medicines for fungal infections like ketoconazole  certain medicines for seizures like carbamazepine, phenobarbital, phenytoin  certain medicines that treat or prevent blood clots like warfarin  cholestyramine  cyclosporine  digoxin  diuretics  female hormones, like estrogens and birth control pills  isoniazid  NSAIDs, medicines for pain inflammation, like ibuprofen or naproxen  other medicines for myasthenia gravis  rifampin  vaccines This list may not describe all possible interactions. Give your health care provider a list of all the medicines, herbs, non-prescription drugs, or dietary supplements you use. Also tell them if you smoke, drink alcohol, or use illegal drugs. Some items may interact with your medicine. What should I watch for while using this medicine? Tell your doctor or healthcare professional if your symptoms do not start to get better or if they get worse. Do not stop taking except on your doctor's advice. You may develop a severe reaction. Your doctor will tell you how much medicine to take. This medicine may increase your risk of getting an infection. Tell your doctor or health care professional if you are around anyone with measles or chickenpox, or if you develop sores or blisters that do not heal properly. This medicine may increase blood sugar levels. Ask your healthcare provider if changes in diet or medicines are needed if you have diabetes. Tell your doctor or health care professional right away if you have any change in your eyesight. Using this medicine for a long time may increase your risk of low bone mass. Talk to your doctor  about bone health. What side effects may I notice from receiving this medicine? Side effects that you should report to your doctor or health care professional as soon as possible:  allergic reactions like skin rash, itching or hives, swelling of the face, lips, or tongue  bloody or tarry stools  hallucination, loss of contact with reality  muscle cramps  muscle pain  palpitations  signs and symptoms of high blood sugar such as being more thirsty or hungry or having to urinate more than normal. You may also feel very tired or have blurry vision.  signs and symptoms of infection like fever or chills; cough; sore throat; pain or trouble passing urine Side effects that usually do not require medical attention (report to your doctor or health care professional if they continue or are bothersome):  changes in emotions or mood  constipation  diarrhea  excessive hair growth on the face or body  headache  nausea, vomiting  trouble sleeping  weight gain This list may not describe all possible side effects. Call your doctor for medical advice about side effects. You may report side effects to FDA at 1-800-FDA-1088. Where should I keep my medicine? Keep out of the reach of children. Store at room temperature between 20 and 25 degrees C (68 and 77 degrees F). Throw away any unused medicine after the expiration date. NOTE: This sheet is a summary. It may not cover all possible information. If you have questions about this medicine, talk to your doctor, pharmacist, or health care provider.  2021 Elsevier/Gold Standard (2018-03-02 09:19:36) Sulfamethoxazole; Trimethoprim, SMX-TMP tablets What is this medicine? SULFAMETHOXAZOLE; TRIMETHOPRIM or SMX-TMP (suhl fuh meth OK suh zohl; trye METH oh prim) is a combination of a sulfonamide antibiotic and a second antibiotic, trimethoprim. It is used to treat or prevent certain kinds of bacterial infections. It will not work for colds, flu, or  other viral infections. This medicine may be used for other purposes; ask your  health care provider or pharmacist if you have questions. COMMON BRAND NAME(S): Bacter-Aid DS, Bactrim, Bactrim DS, Septra, Septra DS What should I tell my health care provider before I take this medicine? They need to know if you have any of these conditions:  G6PD deficiency  HIV or AIDS  kidney disease  liver disease  low platelets  low red blood cell counts  poor nutrition  stomach or intestine problems like colitis  thyroid disease  an unusual or allergic reaction to sulfamethoxazole, trimethoprim, sulfa drugs, other drugs, foods, dyes, or preservatives  pregnant or trying to get pregnant  breast-feeding How should I use this medicine? Take this medicine by mouth with a glass of water. Follow the directions on the prescription label. Take your medicine at regular intervals. Do not take it more often than directed. Take all of your medicine as directed even if you think you are better. Do not skip doses or stop your medicine early. Talk to your pediatrician regarding the use of this medicine in children. While this drug may be prescribed for children as young as 2 months for selected conditions, precautions do apply. Overdosage: If you think you have taken too much of this medicine contact a poison control center or emergency room at once. NOTE: This medicine is only for you. Do not share this medicine with others. What if I miss a dose? If you miss a dose, take it as soon as you can. If it is almost time for your next dose, take only that dose. Do not take double or extra doses. What may interact with this medicine? Do not take this medicine with any of the following medications:  dofetilide This medicine may also interact with the following medications:  amantadine  birth control pills  certain medicines for blood pressure, heart disease  certain medicines for depression, like  amitriptyline  certain medicines for diabetes, like glipizide or glyburide  certain medicines that treat or prevent blood clots like warfarin  cyclosporine  digoxin  diuretics  indomethacin  methotrexate  phenytoin  procainamide  pyrimethamine  zidovudine This list may not describe all possible interactions. Give your health care provider a list of all the medicines, herbs, non-prescription drugs, or dietary supplements you use. Also tell them if you smoke, drink alcohol, or use illegal drugs. Some items may interact with your medicine. What should I watch for while using this medicine? Tell your health care provider if your symptoms do not start to get better or if they get worse. Do not treat diarrhea with over the counter products. Contact your health care provider if you have diarrhea that lasts more than 2 days or if it is severe and watery. This drug may cause serious skin reactions. They can happen weeks to months after starting the drug. Contact your health care provider right away if you notice fevers or flu-like symptoms with a rash. The rash may be red or purple and then turn into blisters or peeling of the skin. Or, you might notice a red rash with swelling of the face, lips or lymph nodes in your neck or under your arms. This drug can make you more sensitive to the sun. Keep out of the sun. If you cannot avoid being in the sun, wear protective clothing and sunscreen. Do not use sun lamps or tanning beds/booths. Be careful brushing or flossing your teeth or using a toothpick because you may get an infection or bleed more easily. If you have any dental work  done, tell your dentist you are receiving this drug. What side effects may I notice from receiving this medicine? Side effects that you should report to your doctor or health care professional as soon as possible:  allergic reactions (skin rash, itching or hives; swelling of the face, lips, or tongue)  bloody or  watery diarrhea  cough  heartbeat rhythm changes (trouble breathing; chest pain; dizziness; fast, irregular heartbeat; feeling faint or lightheaded, falls,)  fever  high potassium levels (chest pain; fast, irregular heartbeat; muscle weakness)  kidney injury (trouble passing urine or change in the amount of urine)  liver injury (dark yellow or brown urine; general ill feeling or flu-like symptoms; loss of appetite, right upper belly pain; unusually weak or tired, yellowing of the eyes or skin)  low blood pressure (dizziness; feeling faint or lightheaded, falls; unusually weak or tired)  low blood sugar (feeling anxious; confusion; dizziness; increased hunger; unusually weak or tired; increased sweating; shakiness; cold, clammy skin; irritable; headache; blurred vision; fast heartbeat; loss of consciousness)  low red blood cell counts (trouble breathing; feeling faint; lightheaded, falls; unusually weak or tired)  rash, fever, and swollen lymph nodes  redness, blistering, peeling, or loosening of the skin, including inside the mouth  trouble breathing  unusual bruising or bleeding Side effects that usually do not require medical attention (report to your doctor or health care professional if they continue or are bothersome):  lack or loss of appetite  nausea  vomiting This list may not describe all possible side effects. Call your doctor for medical advice about side effects. You may report side effects to FDA at 1-800-FDA-1088. Where should I keep my medicine? Keep out of the reach of children. Store between 15 and 25 degrees C (59 to 77 degrees F). Protect from light. Keep the container tightly closed. Throw away any unused drug after the expiration date. NOTE: This sheet is a summary. It may not cover all possible information. If you have questions about this medicine, talk to your doctor, pharmacist, or health care provider.  2021 Elsevier/Gold Standard (2019-05-04  09:54:22)

## 2020-10-10 DIAGNOSIS — G43109 Migraine with aura, not intractable, without status migrainosus: Secondary | ICD-10-CM | POA: Insufficient documentation

## 2020-10-10 DIAGNOSIS — R6883 Chills (without fever): Secondary | ICD-10-CM | POA: Insufficient documentation

## 2020-10-10 DIAGNOSIS — E611 Iron deficiency: Secondary | ICD-10-CM | POA: Insufficient documentation

## 2020-10-10 DIAGNOSIS — N920 Excessive and frequent menstruation with regular cycle: Secondary | ICD-10-CM | POA: Insufficient documentation

## 2020-10-10 MED ORDER — TRIAMCINOLONE ACETONIDE 0.1 % EX CREA: 1.0000 "application " | TOPICAL_CREAM | Freq: Two times a day (BID) | CUTANEOUS | 1 refills | Status: DC

## 2020-10-10 MED ORDER — LORAZEPAM 1 MG PO TABS: 1.0000 mg | ORAL_TABLET | Freq: Four times a day (QID) | ORAL | 0 refills | Status: DC | PRN

## 2020-10-10 MED ORDER — SPIRIVA RESPIMAT 1.25 MCG/ACT IN AERS
2.0000 | INHALATION_SPRAY | Freq: Every day | RESPIRATORY_TRACT | 3 refills | Status: DC
Start: 2020-10-10 — End: 2021-04-22

## 2020-10-10 MED ORDER — NYSTATIN 100000 UNIT/GM EX OINT: 1.0000 "application " | TOPICAL_OINTMENT | Freq: Two times a day (BID) | CUTANEOUS | 1 refills | Status: AC

## 2020-10-14 LAB — SPECIMEN STATUS REPORT

## 2020-10-16 ENCOUNTER — Other Ambulatory Visit: Payer: Self-pay | Admitting: Adult Health

## 2020-10-16 ENCOUNTER — Telehealth: Payer: Self-pay

## 2020-10-16 DIAGNOSIS — B9620 Unspecified Escherichia coli [E. coli] as the cause of diseases classified elsewhere: Secondary | ICD-10-CM | POA: Insufficient documentation

## 2020-10-16 DIAGNOSIS — N390 Urinary tract infection, site not specified: Secondary | ICD-10-CM | POA: Insufficient documentation

## 2020-10-16 LAB — URINE CULTURE

## 2020-10-16 MED ORDER — AMOXICILLIN-POT CLAVULANATE ER 1000-62.5 MG PO TB12: 2.0000 | ORAL_TABLET | Freq: Two times a day (BID) | ORAL | 0 refills | Status: DC

## 2020-10-16 NOTE — Progress Notes (Signed)
E coli in urine is resistant to Bactrim, she should stop it, I am going to send in Augmentin higher dose since she has been on normal dose and see if we can resolve this urinary tract infection as it is resistant to multiple antibiotics per culture and sensitivity-  take as directed - complete all of antibiotics and take with food.  please eat yogurt with probiotics and/ or take probiotics to prevent C difficile.  Will need to repeat urine culture in 3 weeks.   TSH is within normal limits for thyroid.  B12 low end normal ok to continue B12 or al sublingual tablet.  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 Lymes also negative.  Other labs still pending result will result once results come in.

## 2020-10-16 NOTE — Telephone Encounter (Signed)
noted 

## 2020-10-16 NOTE — Telephone Encounter (Signed)
Noted thanks.  We can wait on lab results and have patient go back for sed rate and any additional labs that may be needed at that time.

## 2020-10-16 NOTE — Addendum Note (Signed)
Addended by: Ezequiel Ganser on: 10/16/2020 08:51 AM   Modules accepted: Orders

## 2020-10-16 NOTE — Progress Notes (Signed)
Meds ordered this encounter  Medications  . amoxicillin-clavulanate (AUGMENTIN XR) 1000-62.5 MG 12 hr tablet    Sig: Take 2 tablets by mouth 2 (two) times daily.    Dispense:  20 tablet    Refill:  0   Orders Placed This Encounter  Procedures  . CULTURE, URINE COMPREHENSIVE

## 2020-10-16 NOTE — Telephone Encounter (Signed)
Called and spoke to Manuela Schwartz of [facility] to add on testing to [employer]. Her last date of Service was 10/13/2020. Added on the CBC cDiff, CMP, Lipid, and Vit D. Manuela Schwartz states that a add on sheet will be faxed over, it must be signed and faxed back. Sed Rate was unable to be added on as it has a 24 hour window and it has been beyond 48 hours.

## 2020-10-17 ENCOUNTER — Telehealth: Payer: Self-pay

## 2020-10-17 LAB — COMPREHENSIVE METABOLIC PANEL
ALT: 13 IU/L (ref 0–32)
AST: 10 IU/L (ref 0–40)
Albumin/Globulin Ratio: 2 (ref 1.2–2.2)
Albumin: 4.9 g/dL — ABNORMAL HIGH (ref 3.8–4.8)
Alkaline Phosphatase: 74 IU/L (ref 44–121)
BUN/Creatinine Ratio: 21 (ref 9–23)
BUN: 19 mg/dL (ref 6–20)
Bilirubin Total: 0.2 mg/dL (ref 0.0–1.2)
CO2: 18 mmol/L — ABNORMAL LOW (ref 20–29)
Calcium: 10 mg/dL (ref 8.7–10.2)
Chloride: 101 mmol/L (ref 96–106)
Creatinine, Ser: 0.91 mg/dL (ref 0.57–1.00)
Globulin, Total: 2.4 g/dL (ref 1.5–4.5)
Glucose: 87 mg/dL (ref 65–99)
Potassium: 4.5 mmol/L (ref 3.5–5.2)
Sodium: 139 mmol/L (ref 134–144)
Total Protein: 7.3 g/dL (ref 6.0–8.5)
eGFR: 85 mL/min/{1.73_m2} (ref >59)

## 2020-10-17 LAB — LIPID PANEL W/O CHOL/HDL RATIO
Cholesterol, Total: 188 mg/dL (ref 100–199)
HDL: 52 mg/dL (ref >39)
LDL Chol Calc (NIH): 115 mg/dL — ABNORMAL HIGH (ref 0–99)
Triglycerides: 117 mg/dL (ref 0–149)
VLDL Cholesterol Cal: 21 mg/dL (ref 5–40)

## 2020-10-17 LAB — SPECIMEN STATUS REPORT

## 2020-10-17 LAB — VITAMIN D 25 HYDROXY (VIT D DEFICIENCY, FRACTURES): Vit D, 25-Hydroxy: 27 ng/mL — ABNORMAL LOW (ref 30.0–100.0)

## 2020-10-17 NOTE — Telephone Encounter (Signed)
-----   Message from Doreen Beam, [place] sent at 10/16/2020  2:30 PM EDT ----- E coli in urine is resistant to Bactrim, she should stop it, I am going to send in Augmentin higher dose since she has been on normal dose and see if we can resolve this urinary tract infection as it is resistant to multiple antibiotics per culture and sensitivity-  take as directed - complete all of antibiotics and take with food.  please eat yogurt with probiotics and/ or take probiotics to prevent C difficile.  Will need to repeat urine culture in 3 weeks.   TSH is within normal limits for thyroid.  B12 low end normal ok to continue B12 or al sublingual tablet.  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 Lymes also negative.  Other labs still pending result will result once results come in.

## 2020-10-17 NOTE — Telephone Encounter (Signed)
Received Fax from [facility] on add on labs. Add on Lab sheet has been signed and faxed to [phone].

## 2020-10-20 ENCOUNTER — Other Ambulatory Visit: Payer: Self-pay | Admitting: Adult Health

## 2020-10-20 DIAGNOSIS — R5382 Chronic fatigue, unspecified: Secondary | ICD-10-CM

## 2020-10-20 DIAGNOSIS — N3000 Acute cystitis without hematuria: Secondary | ICD-10-CM

## 2020-10-20 DIAGNOSIS — E559 Vitamin D deficiency, unspecified: Secondary | ICD-10-CM

## 2020-10-20 DIAGNOSIS — Z8639 Personal history of other endocrine, nutritional and metabolic disease: Secondary | ICD-10-CM

## 2020-10-20 MED ORDER — VITAMIN D (ERGOCALCIFEROL) 1.25 MG (50000 UNIT) PO CAPS: 50000.0000 [IU] | ORAL_CAPSULE | ORAL | 1 refills | Status: DC

## 2020-10-20 NOTE — Progress Notes (Signed)
CMP is ok,cholesterol has improved keep up the dietary and lifestyle changes as previously discussed.  Vitamin D still low, I will send in script, take as directed and have Vitamin D rechecked when completed. Multiple myeloma panel negative. 17 - hydroxyprogesterone is still pending.  I do not see results on a CBC yet.  Sed rate they did not  draw will add that on for next labs.

## 2020-10-20 NOTE — Progress Notes (Signed)
Vitamin D deficiency - Plan: Vitamin D, Ergocalciferol, (DRISDOL) 1.25 MG (50000 UNIT) CAPS capsule, VITAMIN D 25 Hydroxy (Vit-D Deficiency, Fractures)  Acute cystitis without hematuria - Plan: CULTURE, URINE COMPREHENSIVE

## 2020-10-21 ENCOUNTER — Other Ambulatory Visit: Payer: Self-pay | Admitting: Adult Health

## 2020-10-21 DIAGNOSIS — R7989 Other specified abnormal findings of blood chemistry: Secondary | ICD-10-CM

## 2020-10-21 DIAGNOSIS — E559 Vitamin D deficiency, unspecified: Secondary | ICD-10-CM

## 2020-10-21 LAB — CBC WITH DIFFERENTIAL/PLATELET
Basophils Absolute: 0 10*3/uL (ref 0.0–0.2)
Basos: 0 %
EOS (ABSOLUTE): 0.1 10*3/uL (ref 0.0–0.4)
Eos: 1 %
Hematocrit: 39.5 % (ref 34.0–46.6)
Hemoglobin: 12.4 g/dL (ref 11.1–15.9)
Immature Grans (Abs): 0 10*3/uL (ref 0.0–0.1)
Immature Granulocytes: 0 %
Lymphocytes Absolute: 3.4 10*3/uL — ABNORMAL HIGH (ref 0.7–3.1)
Lymphs: 34 %
MCH: 25.8 pg — ABNORMAL LOW (ref 26.6–33.0)
MCHC: 31.4 g/dL — ABNORMAL LOW (ref 31.5–35.7)
MCV: 82 fL (ref 79–97)
Monocytes Absolute: 0.7 10*3/uL (ref 0.1–0.9)
Monocytes: 7 %
Neutrophils Absolute: 5.7 10*3/uL (ref 1.4–7.0)
Neutrophils: 58 %
Platelets: 541 10*3/uL — ABNORMAL HIGH (ref 150–450)
RBC: 4.8 x10E6/uL (ref 3.77–5.28)
RDW: 13.7 % (ref 11.7–15.4)
WBC: 10.1 10*3/uL (ref 3.4–10.8)

## 2020-10-21 LAB — COMPREHENSIVE METABOLIC PANEL
ALT: 19 IU/L (ref 0–32)
AST: 13 IU/L (ref 0–40)
Albumin/Globulin Ratio: 1.7 (ref 1.2–2.2)
Albumin: 4.4 g/dL (ref 3.8–4.8)
Alkaline Phosphatase: 73 IU/L (ref 44–121)
BUN/Creatinine Ratio: 12 (ref 9–23)
BUN: 10 mg/dL (ref 6–20)
Bilirubin Total: 0.3 mg/dL (ref 0.0–1.2)
CO2: 25 mmol/L (ref 20–29)
Calcium: 9.5 mg/dL (ref 8.7–10.2)
Chloride: 103 mmol/L (ref 96–106)
Creatinine, Ser: 0.86 mg/dL (ref 0.57–1.00)
Globulin, Total: 2.6 g/dL (ref 1.5–4.5)
Glucose: 90 mg/dL (ref 65–99)
Potassium: 4.8 mmol/L (ref 3.5–5.2)
Sodium: 140 mmol/L (ref 134–144)
Total Protein: 7 g/dL (ref 6.0–8.5)
eGFR: 91 mL/min/{1.73_m2} (ref >59)

## 2020-10-21 LAB — SEDIMENTATION RATE: Sed Rate: 2 mm/hr (ref 0–32)

## 2020-10-21 LAB — LIPID PANEL
Chol/HDL Ratio: 4.2 ratio (ref 0.0–4.4)
Cholesterol, Total: 200 mg/dL — ABNORMAL HIGH (ref 100–199)
HDL: 48 mg/dL (ref >39)
LDL Chol Calc (NIH): 120 mg/dL — ABNORMAL HIGH (ref 0–99)
Triglycerides: 183 mg/dL — ABNORMAL HIGH (ref 0–149)
VLDL Cholesterol Cal: 32 mg/dL (ref 5–40)

## 2020-10-21 LAB — VITAMIN D 25 HYDROXY (VIT D DEFICIENCY, FRACTURES): Vit D, 25-Hydroxy: 22.6 ng/mL — ABNORMAL LOW (ref 30.0–100.0)

## 2020-10-21 MED ORDER — VITAMIN D (ERGOCALCIFEROL) 1.25 MG (50000 UNIT) PO CAPS: 50000.0000 [IU] | ORAL_CAPSULE | ORAL | 0 refills | Status: DC

## 2020-10-21 NOTE — Progress Notes (Signed)
Lab did not draw TIBC iron, ferritin it appears or CRP please call [facility] to see what is happening, and have them add on to present labs please. I placed orders.   CBC stable platelets still elevated, lymphocytes mild elevation she is on antibiotic for UTI.  Sed rate within normal limits.  Vitamin  D is low, this can contribute to poor sleep and fatigue, will send in prescription for Vitamin D at 50,000 units by mouth once every 7 days/(once weekly) for 12 weeks. Advise recheck lab Vitamin D in 1-2 weeks after completing vitamin d prescription. Labs need to be scheduled.   [facility] repeated lipid panel, I doubt that this is fasting here. 8 days ago was much improved  CMP within normal limits.   CBC, vitamin D  recheck in 3 months orders placed and urine culture ordered for repeat as well as previously advised.

## 2020-10-22 LAB — ROCKY MTN SPOTTED FVR ABS PNL(IGG+IGM)
RMSF IgG: NEGATIVE
RMSF IgM: 0.6 index (ref 0.00–0.89)

## 2020-10-22 LAB — COMMENT:

## 2020-10-22 LAB — B12 AND FOLATE PANEL
Folate: 9.4 ng/mL (ref >3.0)
Vitamin B-12: 408 pg/mL (ref 232–1245)

## 2020-10-22 LAB — MULTIPLE MYELOMA CASCADE WITH REFLEX TO SIFE AND SFLC
A/G Ratio: 1.4 (ref 0.7–1.7)
Albumin ELP: 4.3 g/dL (ref 2.9–4.4)
Alpha 1: 0.2 g/dL (ref 0.0–0.4)
Alpha 2: 0.7 g/dL (ref 0.4–1.0)
Beta: 1.1 g/dL (ref 0.7–1.3)
Gamma Globulin: 1 g/dL (ref 0.4–1.8)
Globulin, Total: 3 g/dL (ref 2.2–3.9)
Reflex Testing: 0
Total Protein: 7.3 g/dL (ref 6.0–8.5)

## 2020-10-22 LAB — FREE K+L LT CHAINS,QN,S
Ig Kappa Free Light Chain: 9.4 mg/L (ref 3.3–19.4)
Ig Lambda Free Light Chain: 8.2 mg/L (ref 5.7–26.3)
KAPPA/LAMBDA RATIO: 1.15 (ref 0.26–1.65)

## 2020-10-22 LAB — TSH: TSH: 1.91 u[IU]/mL (ref 0.450–4.500)

## 2020-10-22 LAB — 17-HYDROXYPROGESTERONE: 17-Hydroxyprogesterone: 18 ng/dL

## 2020-10-22 LAB — LYME DISEASE SEROLOGY W/REFLEX: Lyme Total Antibody EIA: NEGATIVE

## 2020-10-23 LAB — IRON,TIBC AND FERRITIN PANEL
Ferritin: 19 ng/mL (ref 15–150)
Iron Saturation: 13 % — ABNORMAL LOW (ref 15–55)
Iron: 43 ug/dL (ref 27–159)
Total Iron Binding Capacity: 342 ug/dL (ref 250–450)
UIBC: 299 ug/dL (ref 131–425)

## 2020-10-23 LAB — SPECIMEN STATUS REPORT

## 2020-10-23 LAB — C-REACTIVE PROTEIN: CRP: 1 mg/L (ref 0–10)

## 2020-10-23 NOTE — Progress Notes (Signed)
   Iron saturation is still low, however improved, Iron is still on low end as well as TIBC still on the high end, meaning that she should continue iron supplement as prescribed and recheck CBC, and TIBC ferritin/ iron in 3 months and platelets will likely improve   CRP is within normal limits.  We can place future 3 month labs for here or [facility] just let me know and I can place orders.

## 2020-10-28 NOTE — Progress Notes (Signed)
17-Hydroxyprogesterone within normal limits for patient's age no cause for heavy menses.

## 2020-10-29 ENCOUNTER — Other Ambulatory Visit: Payer: Self-pay | Admitting: Adult Health

## 2020-10-29 NOTE — Telephone Encounter (Signed)
Requested medication (s) are due for refill today: no  Requested medication (s) are on the active medication list: yes   Last refill:  07/10/2020  Future visit scheduled: no  Notes to clinic:  this refill cannot be delegated    Requested Prescriptions  Pending Prescriptions Disp Refills   cyclobenzaprine (FLEXERIL) 10 MG tablet [Pharmacy Med Name: CYCLOBENZAPRINE 10MG  TABLETS] 90 tablet 0    Sig: TAKE 1 TABLET(10 MG) BY MOUTH THREE TIMES DAILY AS NEEDED FOR MUSCLE SPASMS      Not Delegated - Analgesics:  Muscle Relaxants Failed - 10/29/2020  6:17 AM      Failed - This refill cannot be delegated      Passed - Valid encounter within last 6 months    Recent Outpatient Visits           4 months ago Obstructive sleep apnea syndrome   [facility], Kelby Aline, FNP   6 months ago Pain of left calf   [facility], Kelby Aline, FNP   6 months ago Nausea   [name], Kelby Aline, FNP   7 months ago Cluster headache, not intractable, unspecified chronicity pattern   [name], Kelby Aline, FNP   7 months ago Insomnia, unspecified type   [facility], Kelby Aline, FNP

## 2020-11-03 ENCOUNTER — Encounter: Payer: Self-pay | Admitting: Adult Health

## 2020-11-04 ENCOUNTER — Encounter: Payer: Self-pay | Admitting: Adult Health

## 2020-11-04 ENCOUNTER — Other Ambulatory Visit: Payer: Self-pay | Admitting: Adult Health

## 2020-11-04 DIAGNOSIS — Z800 Family history of malignant neoplasm of digestive organs: Secondary | ICD-10-CM

## 2020-11-04 DIAGNOSIS — R87619 Unspecified abnormal cytological findings in specimens from cervix uteri: Secondary | ICD-10-CM

## 2020-11-04 DIAGNOSIS — Z86010 Personal history of colonic polyps: Secondary | ICD-10-CM | POA: Insufficient documentation

## 2020-11-04 DIAGNOSIS — R140 Abdominal distension (gaseous): Secondary | ICD-10-CM

## 2020-11-04 DIAGNOSIS — R5383 Other fatigue: Secondary | ICD-10-CM

## 2020-11-04 NOTE — Progress Notes (Signed)
Orders Placed This Encounter  Procedures  . Ambulatory referral to Gastroenterology    Referral Priority:   Urgent    Referral Type:   Consultation    Referral Reason:   Specialty Services Required    Number of Visits Requested:   [place], MD, Short Gastroenterologist [phone]

## 2020-11-04 NOTE — Progress Notes (Signed)
[place]- chart review for continuity of care. Has colposcopy scheduled at [facility]      PAP Test Liquid-Based Screening-Low Risk(Routine) Specimen:  Genital - Cervix uteri structure (body structure) Component 2 wk ago Comments  Case Report  Gynecologic (Pap Test)              Case: [id number]                 Authorizing Provider: [place] Primus, Collected:      10/20/2020 1349                   MD                                      Ordering Location:   [facility]      Received:      10/20/2020 1645        First Screen:     Valaria Good, CT(ASCP)                           Pathologist:      Hulan Amato, MD                           Specimen:  PAP LIQUID BASED IMAGED SCRN, Cervical/Endocervical                    Diagnostic Interpretation  EPITHELIAL CELL ABNORMALITY    Electronically signed by Hulan Amato, MD on 10/22/2020 at 4:01 PM   Findings  Atypical glandular cells (AGC - NOS or specify in comments).  Numerous endometrial cells are noted. Correlation with menstrual history (not provided) is recommended.  Specimen Source     Cervical/Endocervical  Statement of Adequacy  Satisfactory for evaluation.    Endocervical Component  Endocervical/transformation zone component is present.    Clinical Information  None    Menstrual History  Other (Specify in COMMENTS)    Material Submitted     Liquid-based sample received in preservative vial. 1 ThinPrep slide prepared.   Imager Additional Documentation     This slide was analyzed by the [place] with manual cytotechnologist rescreen or review.  Additional Documentation     Comment: The pap test is a screening test for cervical cancer and precursor lesions with an  inherent false negative rate.  Regular screening and correlation with clinical findings is recommended.  Attestation     All of the diagnostic evaluations on the enumerated specimens have been personally conducted by the pathologists involved in the care of this patient as indicated by the electronic signatures above.  Resulting Agency [facility]   Specimen Collected: 10/20/20 1:49 PM Last Resulted: 10/22/20 4:01 PM  Received From: [facility]  Result Received: 10/28/20 7:37 AM     Eye exam at [facility].  OCT Glaucoma - [place]  Anatomical Region Laterality Modality  Head -- Other    Narrative  Right Eye  Reliability was good. RNFL thickness was normal. Macular thickness map was  normal.   Left Eye  Reliability was good. RNFL thickness was normal. Macular thickness map was  normal.  Exam End: 09/22/20 10:57 AM Last Resulted: 09/22/20 10:57 AM  Received From: [facility]  Result Received: 09/23/20 10:08 AM

## 2020-11-12 ENCOUNTER — Other Ambulatory Visit: Payer: Self-pay | Admitting: Adult Health

## 2020-11-12 ENCOUNTER — Encounter: Payer: Self-pay | Admitting: Adult Health

## 2020-11-12 DIAGNOSIS — L237 Allergic contact dermatitis due to plants, except food: Secondary | ICD-10-CM

## 2020-11-12 MED ORDER — METHYLPREDNISOLONE 4 MG PO TBPK: ORAL_TABLET | ORAL | 1 refills | Status: DC

## 2020-11-12 MED ORDER — TRIAMCINOLONE ACETONIDE 0.1 % EX CREA
1.0000 | TOPICAL_CREAM | Freq: Two times a day (BID) | CUTANEOUS | 1 refills | Status: DC
Start: 2020-11-12 — End: 2021-06-11

## 2020-11-12 NOTE — Progress Notes (Signed)
No orders of the defined types were placed in this encounter.   Meds ordered this encounter  Medications  . triamcinolone cream (KENALOG) 0.1 %    Sig: Apply 1 application topically 2 (two) times daily. Use thin layer only not for genitals or face for extreme irritation    Dispense:  80 g    Refill:  1  . methylPREDNISolone (MEDROL DOSEPAK) 4 MG TBPK tablet    Sig: .PO: Take 6 tablets on day 1:Take 5 tablets day 2:Take 4 tablets day 3: Take 3 tablets day 4:Take 2 tablets day five: 5 Take 1 tablet day 6    Dispense:  21 tablet    Refill:  1

## 2020-11-22 ENCOUNTER — Other Ambulatory Visit: Payer: Self-pay | Admitting: Family Medicine

## 2020-11-22 NOTE — Telephone Encounter (Signed)
Requested medications are due for refill today yes  Requested medications are on the active medication list yes  Last refill 10/29/20  Last visit 05/2020  Future visit scheduled no  Notes to clinic Not Delegated

## 2020-11-28 ENCOUNTER — Telehealth: Payer: Self-pay

## 2020-11-28 LAB — CBC WITH DIFFERENTIAL/PLATELET
Basophils Absolute: 0.1 10*3/uL (ref 0.0–0.2)
Basos: 1 %
EOS (ABSOLUTE): 0.1 10*3/uL (ref 0.0–0.4)
Eos: 2 %
Hematocrit: 37 % (ref 34.0–46.6)
Hemoglobin: 11.9 g/dL (ref 11.1–15.9)
Immature Grans (Abs): 0.1 10*3/uL (ref 0.0–0.1)
Immature Granulocytes: 1 %
Lymphocytes Absolute: 2.1 10*3/uL (ref 0.7–3.1)
Lymphs: 22 %
MCH: 26.5 pg — ABNORMAL LOW (ref 26.6–33.0)
MCHC: 32.2 g/dL (ref 31.5–35.7)
MCV: 82 fL (ref 79–97)
Monocytes Absolute: 0.7 10*3/uL (ref 0.1–0.9)
Monocytes: 7 %
Neutrophils Absolute: 6.6 10*3/uL (ref 1.4–7.0)
Neutrophils: 67 %
Platelets: 497 10*3/uL — ABNORMAL HIGH (ref 150–450)
RBC: 4.49 x10E6/uL (ref 3.77–5.28)
RDW: 13.5 % (ref 11.7–15.4)
WBC: 9.7 10*3/uL (ref 3.4–10.8)

## 2020-11-28 LAB — C-REACTIVE PROTEIN: CRP: 2 mg/L (ref 0–10)

## 2020-11-28 LAB — SEDIMENTATION RATE: Sed Rate: 2 mm/hr (ref 0–32)

## 2020-11-28 LAB — IRON,TIBC AND FERRITIN PANEL
Ferritin: 15 ng/mL (ref 15–150)
Iron Saturation: 9 % — CL (ref 15–55)
Iron: 31 ug/dL (ref 27–159)
Total Iron Binding Capacity: 341 ug/dL (ref 250–450)
UIBC: 310 ug/dL (ref 131–425)

## 2020-11-28 LAB — VITAMIN D 25 HYDROXY (VIT D DEFICIENCY, FRACTURES): Vit D, 25-Hydroxy: 35.1 ng/mL (ref 30.0–100.0)

## 2020-11-28 NOTE — Telephone Encounter (Signed)
Left message to call back for lab results.

## 2020-11-28 NOTE — Progress Notes (Signed)
Vitamin D is within normal but still on low side will send in script to take as directed.  Sed rate and c reactive protein is within normal limits.  Iron saturation is very  low, CBC platelets improved slightly.  Please verify how she is taking her iron supplement and if it is prescription or over the counter if OTC get dosage and name of.

## 2020-12-02 LAB — CULTURE, URINE COMPREHENSIVE

## 2020-12-08 NOTE — Progress Notes (Signed)
Urine with Group B strep suspect colonization.  Labs previously resulted.

## 2021-01-25 ENCOUNTER — Encounter: Payer: Self-pay | Admitting: Adult Health

## 2021-01-26 NOTE — Telephone Encounter (Signed)
Spoke with pt and scheduled her for an appt on 02/09/2021. Pt wanted a virtual visit declined an [facility] [place] office appt.

## 2021-02-09 ENCOUNTER — Encounter: Payer: Self-pay | Admitting: Family

## 2021-02-09 ENCOUNTER — Telehealth (INDEPENDENT_AMBULATORY_CARE_PROVIDER_SITE_OTHER): Payer: 59 | Admitting: Family

## 2021-02-09 VITALS — Ht 62.99 in | Wt 185.0 lb

## 2021-02-09 DIAGNOSIS — F419 Anxiety disorder, unspecified: Secondary | ICD-10-CM

## 2021-02-09 DIAGNOSIS — U099 Post covid-19 condition, unspecified: Secondary | ICD-10-CM | POA: Diagnosis not present

## 2021-02-09 MED ORDER — ESCITALOPRAM OXALATE 10 MG PO TABS: 10.0000 mg | ORAL_TABLET | Freq: Every day | ORAL | 1 refills | Status: DC

## 2021-02-09 NOTE — Progress Notes (Signed)
Virtual Visit via Video Note  I connected with@  on 02/09/21 at 12:00 PM EDT by a video enabled telemedicine application and verified that I am speaking with the correct person using two identifiers.  Location patient: home Location provider:work  Persons participating in the virtual visit: patient, provider  I discussed the limitations of evaluation and management by telemedicine and the availability of in person appointments. The patient expressed understanding and agreed to proceed.   HPI:  Follow up covid, anxiety  She would like labs repeated for anemia. She is compliant with ferrous sulfate.  She has multiple UTIs this calendar year.  No current dysuria, fever.  She had covid 01/08/21.  Complains of head fog daily which is most bothersome.  This has been persistent since she had COVID. She also complains of dizziness , 4-5 times per week, without inciting event since covid. Occurs when sitting or when moving from sitting to standing.  No syncope, cough, fever, ear pressure, ear popping.   Complains of heart palpitations for the past 4 weeks.  Onset correlates to having COVID.  Reports last night she had an episode while standing, for approx 20 minutes and waxing and waning. Palpitations stopped on its own.  No associated cp, sob. She was lightheaded at the time.  Occurs 3-4 weeks per week.    Ever since covid, she has also felt more anxious. She has been on ativan in the past. She stopped taking lexapro 5 mg as couldn't tell difference. She takes trazodone '50mg'$  prn however has not required recently. She takes atarax '50mg'$  prn for itching.   She denies depression.   No si/hi. She hasnt been hospitalized for depression, anxiety.     No h/o arrhythmia, cardiac evaluation. No family scd.    ROS: See pertinent positives and negatives per HPI.    EXAM:  VITALS per patient if applicable: Ht 5' 123XX123" (1.6 m)   Wt 185 lb (83.9 kg)   LMP 01/19/2021   BMI 32.78 kg/m  BP  Readings from Last 3 Encounters:  05/16/12 115/79   Wt Readings from Last 3 Encounters:  02/09/21 185 lb (83.9 kg)  10/09/20 185 lb (83.9 kg)    GENERAL: alert, oriented, appears well and in no acute distress  HEENT: atraumatic, conjunttiva clear, no obvious abnormalities on inspection of external nose and ears  NECK: normal movements of the head and neck  LUNGS: on inspection no signs of respiratory distress, breathing rate appears normal, no obvious gross SOB, gasping or wheezing  CV: no obvious cyanosis  MS: moves all visible extremities without noticeable abnormality  PSYCH/NEURO: pleasant and cooperative, no obvious depression or anxiety, speech and thought processing grossly intact  ASSESSMENT AND PLAN:  Discussed the following assessment and plan:  Problem List Items Addressed This Visit       Other   Anxiety    Uncontrolled.  Advised against benzodiazepine such as Ativan.  Encourage SSRI for safer more effective treatment.  She was very agreeable to this.  We agreed to retrial Lexapro at 10 mg (previously she been on 5 mg) with increase in 2 weeks time.  Close follow-up in 6 weeks.      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 Dizziness, brain fog, palpitations since contracting COVID-19 4 weeks ago.Dizziness and palpitations are episodic; no inciting event. We discussed dehydration, anxiety is exacerbating factors. We discussed these symptoms as it relates  to her safety and inability to safely evaluate with virtual visit.  I advised that she would need to evaluated in person at [facility] , with  EKG.  Advised that she be seen in urgent care today and not to delay any medical treatment.  She verbalized understanding and stated that she would try to go to urgent care this afternoon. Discussed cardiology referral if symptoms persist and after results of EKG, UC evaluation.       Relevant  Medications   escitalopram (LEXAPRO) 10 MG tablet   Other Relevant Orders   CBC with Differential/Platelet   Comprehensive metabolic panel   Urinalysis, Routine w reflex microscopic   Urine Culture   SARS-CoV-2 Semi-Quantitative Total Antibody, Spike   Iron, TIBC and Ferritin Panel    -we discussed possible serious and likely etiologies, options for evaluation and workup, limitations of telemedicine visit vs in person visit, treatment, treatment risks and precautions. Pt prefers to treat via telemedicine empirically rather then risking or undertaking an in person visit at this moment.  .   I discussed the assessment and treatment plan with the patient. The patient was provided an opportunity to ask questions and all were answered. The patient agreed with the plan and demonstrated an understanding of the instructions.   The patient was advised to call back or seek an in-person evaluation if the symptoms worsen or if the condition fails to improve as anticipated.   Mable Paris, FNP

## 2021-02-09 NOTE — Assessment & Plan Note (Signed)
Uncontrolled.  Advised against benzodiazepine such as Ativan.  Encourage SSRI for safer more effective treatment.  She was very agreeable to this.  We agreed to retrial Lexapro at 10 mg (previously she been on 5 mg) with increase in 2 weeks time.  Close follow-up in 6 weeks.

## 2021-02-09 NOTE — Assessment & Plan Note (Addendum)
Uncontrolled. Dizziness, brain fog, palpitations since contracting COVID-19 4 weeks ago.Dizziness and palpitations are episodic; no inciting event. We discussed dehydration, anxiety is exacerbating factors. We discussed these symptoms as it relates to her safety and inability to safely evaluate with virtual visit.  I advised that she would need to evaluated in person at [facility] , with  EKG.  Advised that she be seen in urgent care today and not to delay any medical treatment.  She verbalized understanding and stated that she would try to go to urgent care this afternoon. Discussed cardiology referral if symptoms persist and after results of EKG, UC evaluation.

## 2021-02-09 NOTE — Patient Instructions (Addendum)
Start lexapro '10mg'$  ; in 2 weeks time, you may increase to lexapro '20mg'$   Again, I cannot emphasize enough that it is important to go to urgent care today and not delay in regards to the dizziness, palpitations.  I cannot safely assess those symptoms over video today.  I would advise you to have an EKG and in person evaluation.   Please call to schedule labs, urine in the next 1 to 2 weeks.  Dizziness Dizziness is a common problem. It is a feeling of unsteadiness or light-headedness. You may feel like you are about to faint. Dizziness can lead to injury if you stumble or fall. Anyone can become dizzy, but dizziness is more common in older adults. This condition can be caused by a number ofthings, including medicines, dehydration, or illness. Follow these instructions at home: Eating and drinking  Drink enough fluid to keep your urine pale yellow. This helps to keep you from becoming dehydrated. Try to drink more clear fluids, such as water. Do not drink alcohol. Limit your caffeine intake if told to do so by your health care provider. Check ingredients and nutrition facts to see if a food or beverage contains caffeine. Limit your salt (sodium) intake if told to do so by your health care provider. Check ingredients and nutrition facts to see if a food or beverage contains sodium.  Activity  Avoid making quick movements. Rise slowly from chairs and steady yourself until you feel okay. In the morning, first sit up on the side of the bed. When you feel okay, stand slowly while you hold onto something until you know that your balance is good. If you need to stand in one place for a long time, move your legs often. Tighten and relax the muscles in your legs while you are standing. Do not drive or use machinery if you feel dizzy. Avoid bending down if you feel dizzy. Place items in your home so that they are easy for you to reach without leaning over.  Lifestyle Do not use any products that contain  nicotine or tobacco. These products include cigarettes, chewing tobacco, and vaping devices, such as e-cigarettes. If you need help quitting, ask your health care provider. Try to reduce your stress level by using methods such as yoga or meditation. Talk with your health care provider if you need help to manage your stress. General instructions Watch your dizziness for any changes. Take over-the-counter and prescription medicines only as told by your health care provider. Talk with your health care provider if you think that your dizziness is caused by a medicine that you are taking. Tell a friend or a family member that you are feeling dizzy. If he or she notices any changes in your behavior, have this person call your health care provider. Keep all follow-up visits. This is important. Contact a health care provider if: Your dizziness does not go away or you have new symptoms. Your dizziness or light-headedness gets worse. You feel nauseous. You have reduced hearing. You have a fever. You have neck pain or a stiff neck. Your dizziness leads to an injury or a fall. Get help right away if: You vomit or have diarrhea and are unable to eat or drink anything. You have problems talking, walking, swallowing, or using your arms, hands, or legs. You feel generally weak. You have any bleeding. You are not thinking clearly or you have trouble forming sentences. It may take a friend or family member to notice this. You have  chest pain, abdominal pain, shortness of breath, or sweating. Your vision changes or you develop a severe headache. These symptoms may represent a serious problem that is an emergency. Do not wait to see if the symptoms will go away. Get medical help right away. Call your local emergency services (911 in the [place] Do not drive yourself to the hospital. Summary Dizziness is a feeling of unsteadiness or light-headedness. This condition can be caused by a number of things, including  medicines, dehydration, or illness. Anyone can become dizzy, but dizziness is more common in older adults. Drink enough fluid to keep your urine pale yellow. Do not drink alcohol. Avoid making quick movements if you feel dizzy. Monitor your dizziness for any changes. This information is not intended to replace advice given to you by your health care provider. Make sure you discuss any questions you have with your healthcare provider. Document Revised: 05/05/2020 Document Reviewed: 05/05/2020 Elsevier Patient Education  2022 [place]. Palpitations Palpitations are feelings that your heartbeat is irregular or is faster than normal. It may feel like your heart is fluttering or skipping a beat. Palpitations are usually not a serious problem. They may be caused by many things, including smoking, caffeine, alcohol, stress, and certain medicines or drugs. Most causes of palpitations are not serious. However, some palpitations can be a sign of a serious problem. You may need further tests to rule outserious medical problems. Follow these instructions at home:     Pay attention to any changes in your condition. Take these actions to helpmanage your symptoms: Eating and drinking Avoid foods and drinks that may cause palpitations. These may include: Caffeinated coffee, tea, soft drinks, diet pills, and energy drinks. Chocolate. Alcohol. Lifestyle Take steps to reduce your stress and anxiety. Things that can help you relax include: Yoga. Mind-body activities, such as deep breathing, meditation, or using words and images to create positive thoughts (guided imagery). Physical activity, such as swimming, jogging, or walking. Tell your health care provider if your palpitations increase with activity. If you have chest pain or shortness of breath with activity, do not continue the activity until you are seen by your health care provider. Biofeedback. This is a method that helps you learn to use your mind  to control things in your body, such as your heartbeat. Do not use drugs, including cocaine or ecstasy. Do not use marijuana. Get plenty of rest and sleep. Keep a regular bed time. General instructions Take over-the-counter and prescription medicines only as told by your health care provider. Do not use any products that contain nicotine or tobacco, such as cigarettes and e-cigarettes. If you need help quitting, ask your health care provider. Keep all follow-up visits as told by your health care provider. This is important. These may include visits for further testing if palpitations do not go away or get worse. Contact a health care provider if you: Continue to have a fast or irregular heartbeat after 24 hours. Notice that your palpitations occur more often. Get help right away if you: Have chest pain or shortness of breath. Have a severe headache. Feel dizzy or you faint. Summary Palpitations are feelings that your heartbeat is irregular or is faster than normal. It may feel like your heart is fluttering or skipping a beat. Palpitations may be caused by many things, including smoking, caffeine, alcohol, stress, certain medicines, and drugs. Although most causes of palpitations are not serious, some causes can be a sign of a serious medical problem. Get help  right away if you faint or have chest pain, shortness of breath, a severe headache, or dizziness. This information is not intended to replace advice given to you by your health care provider. Make sure you discuss any questions you have with your healthcare provider. Document Revised: 07/13/2017 Document Reviewed: 07/13/2017 Elsevier Patient Education  2022 [place]. http://[email or web address].NIH.Gov">  Generalized Anxiety Disorder, Adult Generalized anxiety disorder (GAD) is a mental health condition. Unlike normal worries, anxiety related to GAD is not triggered by a specific event. These worries do not fade or get better with time. GAD  interferes with relationships,work, and school. GAD symptoms can vary from mild to severe. People with severe GAD can have intense waves of anxiety with physical symptoms that are similar to panicattacks. What are the causes? The exact cause of GAD is not known, but the following are believed to have an impact: Differences in natural brain chemicals. Genes passed down from parents to children. Differences in the way threats are perceived. Development during childhood. Personality. What increases the risk? The following factors may make you more likely to develop this condition: Being female. Having a family history of anxiety disorders. Being very shy. Experiencing very stressful life events, such as the death of a loved one. Having a very stressful family environment. What are the signs or symptoms? People with GAD often worry excessively about many things in their lives, such as their health and family. Symptoms may also include: Mental and emotional symptoms: Worrying excessively about natural disasters. Fear of being late. Difficulty concentrating. Fears that others are judging your performance. Physical symptoms: Fatigue. Headaches, muscle tension, muscle twitches, trembling, or feeling shaky. Feeling like your heart is pounding or beating very fast. Feeling out of breath or like you cannot take a deep breath. Having trouble falling asleep or staying asleep, or experiencing restlessness. Sweating. Nausea, diarrhea, or irritable bowel syndrome (IBS). Behavioral symptoms: Experiencing erratic moods or irritability. Avoidance of new situations. Avoidance of people. Extreme difficulty making decisions. How is this diagnosed? This condition is diagnosed based on your symptoms and medical history. You will also have a physical exam. Your health care provider may perform tests torule out other possible causes of your symptoms. To be diagnosed with GAD, a person must have anxiety  that: Is out of his or her control. Affects several different aspects of his or her life, such as work and relationships. Causes distress that makes him or her unable to take part in normal activities. Includes at least three symptoms of GAD, such as restlessness, fatigue, trouble concentrating, irritability, muscle tension, or sleep problems. Before your health care provider can confirm a diagnosis of GAD, these symptoms must be present more days than they are not, and they must last for 6 months orlonger. How is this treated? This condition may be treated with: Medicine. Antidepressant medicine is usually prescribed for long-term daily control. Anti-anxiety medicines may be added in severe cases, especially when panic attacks occur. Talk therapy (psychotherapy). Certain types of talk therapy can be helpful in treating GAD by providing support, education, and guidance. Options include: Cognitive behavioral therapy (CBT). People learn coping skills and self-calming techniques to ease their physical symptoms. They learn to identify unrealistic thoughts and behaviors and to replace them with more appropriate thoughts and behaviors. Acceptance and commitment therapy (ACT). This treatment teaches people how to be mindful as a way to cope with unwanted thoughts and feelings. Biofeedback. This process trains you to manage your body's response (physiological response) through breathing  techniques and relaxation methods. You will work with a therapist while machines are used to monitor your physical symptoms. Stress management techniques. These include yoga, meditation, and exercise. A mental health specialist can help determine which treatment is best for you. Some people see improvement with one type of therapy. However, other peoplerequire a combination of therapies. Follow these instructions at home: Lifestyle Maintain a consistent routine and schedule. Anticipate stressful situations. Create a plan,  and allow extra time to work with your plan. Practice stress management or self-calming techniques that you have learned from your therapist or your health care provider. General instructions Take over-the-counter and prescription medicines only as told by your health care provider. Understand that you are likely to have setbacks. Accept this and be kind to yourself as you persist to take better care of yourself. Recognize and accept your accomplishments, even if you judge them as small. Keep all follow-up visits as told by your health care provider. This is important. Contact a health care provider if: Your symptoms do not get better. Your symptoms get worse. You have signs of depression, such as: A persistently sad or irritable mood. Loss of enjoyment in activities that used to bring you joy. Change in weight or eating. Changes in sleeping habits. Avoiding friends or family members. Loss of energy for normal tasks. Feelings of guilt or worthlessness. Get help right away if: You have serious thoughts about hurting yourself or others. If you ever feel like you may hurt yourself or others, or have thoughts about taking your own life, get help right away. Go to your nearest emergency department or: Call your local emergency services (911 in the [place] Call a suicide crisis helpline, such as the [place] at (907) 335-4229. This is open 24 hours a day in the [place] Text the Crisis Text Line at (475) 106-0705 (in the [place].). Summary Generalized anxiety disorder (GAD) is a mental health condition that involves worry that is not triggered by a specific event. People with GAD often worry excessively about many things in their lives, such as their health and family. GAD may cause symptoms such as restlessness, trouble concentrating, sleep problems, frequent sweating, nausea, diarrhea, headaches, and trembling or muscle twitching. A mental health specialist can help determine which  treatment is best for you. Some people see improvement with one type of therapy. However, other people require a combination of therapies. This information is not intended to replace advice given to you by your health care provider. Make sure you discuss any questions you have with your healthcare provider. Document Revised: 03/21/2019 Document Reviewed: 03/21/2019 Elsevier Patient Education  [place].

## 2021-02-11 LAB — COMPREHENSIVE METABOLIC PANEL
ALT: 18 IU/L (ref 0–32)
AST: 14 IU/L (ref 0–40)
Albumin/Globulin Ratio: 2.4 — ABNORMAL HIGH (ref 1.2–2.2)
Albumin: 4.7 g/dL (ref 3.8–4.8)
Alkaline Phosphatase: 76 IU/L (ref 44–121)
BUN/Creatinine Ratio: 13 (ref 9–23)
BUN: 10 mg/dL (ref 6–20)
Bilirubin Total: 0.4 mg/dL (ref 0.0–1.2)
CO2: 23 mmol/L (ref 20–29)
Calcium: 9.3 mg/dL (ref 8.7–10.2)
Chloride: 104 mmol/L (ref 96–106)
Creatinine, Ser: 0.76 mg/dL (ref 0.57–1.00)
Globulin, Total: 2 g/dL (ref 1.5–4.5)
Glucose: 84 mg/dL (ref 65–99)
Potassium: 4 mmol/L (ref 3.5–5.2)
Sodium: 142 mmol/L (ref 134–144)
Total Protein: 6.7 g/dL (ref 6.0–8.5)
eGFR: 105 mL/min/{1.73_m2} (ref >59)

## 2021-02-11 LAB — CBC WITH DIFFERENTIAL/PLATELET
Basophils Absolute: 0 10*3/uL (ref 0.0–0.2)
Basos: 0 %
EOS (ABSOLUTE): 0.1 10*3/uL (ref 0.0–0.4)
Eos: 2 %
Hematocrit: 35.7 % (ref 34.0–46.6)
Hemoglobin: 11.6 g/dL (ref 11.1–15.9)
Immature Grans (Abs): 0 10*3/uL (ref 0.0–0.1)
Immature Granulocytes: 0 %
Lymphocytes Absolute: 2.4 10*3/uL (ref 0.7–3.1)
Lymphs: 35 %
MCH: 26.5 pg — ABNORMAL LOW (ref 26.6–33.0)
MCHC: 32.5 g/dL (ref 31.5–35.7)
MCV: 82 fL (ref 79–97)
Monocytes Absolute: 0.5 10*3/uL (ref 0.1–0.9)
Monocytes: 8 %
Neutrophils Absolute: 3.7 10*3/uL (ref 1.4–7.0)
Neutrophils: 55 %
Platelets: 419 10*3/uL (ref 150–450)
RBC: 4.37 x10E6/uL (ref 3.77–5.28)
RDW: 13.8 % (ref 11.7–15.4)
WBC: 6.8 10*3/uL (ref 3.4–10.8)

## 2021-02-11 LAB — URINALYSIS, ROUTINE W REFLEX MICROSCOPIC
Bilirubin, UA: NEGATIVE
Glucose, UA: NEGATIVE
Ketones, UA: NEGATIVE
Leukocytes,UA: NEGATIVE
Nitrite, UA: NEGATIVE
Protein,UA: NEGATIVE
RBC, UA: NEGATIVE
Specific Gravity, UA: 1.023 (ref 1.005–1.030)
Urobilinogen, Ur: 0.2 mg/dL (ref 0.2–1.0)
pH, UA: 6.5 (ref 5.0–7.5)

## 2021-02-11 LAB — IRON,TIBC AND FERRITIN PANEL
Ferritin: 20 ng/mL (ref 15–150)
Iron Saturation: 7 % — CL (ref 15–55)
Iron: 26 ug/dL — ABNORMAL LOW (ref 27–159)
Total Iron Binding Capacity: 370 ug/dL (ref 250–450)
UIBC: 344 ug/dL (ref 131–425)

## 2021-02-11 LAB — SARS-COV-2 SEMI-QUANTITATIVE TOTAL ANTIBODY, SPIKE
SARS-CoV-2 Semi-Quant Total Ab: 1.7 U/mL (ref <0.8)
SARS-CoV-2 Spike Ab Interp: POSITIVE

## 2021-02-11 LAB — URINE CULTURE

## 2021-02-18 ENCOUNTER — Other Ambulatory Visit: Payer: Self-pay

## 2021-02-18 ENCOUNTER — Other Ambulatory Visit: Payer: Self-pay | Admitting: Family

## 2021-02-18 ENCOUNTER — Telehealth: Payer: Self-pay | Admitting: Adult Health

## 2021-02-18 ENCOUNTER — Encounter: Payer: Self-pay | Admitting: Family

## 2021-02-18 DIAGNOSIS — Z8639 Personal history of other endocrine, nutritional and metabolic disease: Secondary | ICD-10-CM

## 2021-02-18 MED ORDER — PANTOPRAZOLE SODIUM 40 MG PO TBEC: 40.0000 mg | DELAYED_RELEASE_TABLET | Freq: Two times a day (BID) | ORAL | 1 refills | Status: DC

## 2021-02-18 NOTE — Telephone Encounter (Signed)
[facility] faxed refill request for the following medications:   pantoprazole (PROTONIX) 40 MG tablet    Please advise.  

## 2021-02-23 ENCOUNTER — Other Ambulatory Visit: Payer: Self-pay

## 2021-02-23 DIAGNOSIS — R7989 Other specified abnormal findings of blood chemistry: Secondary | ICD-10-CM

## 2021-03-02 IMAGING — MR MR ABDOMEN WO/W CM
16 of 18 series · 44 of 48 positions shown · IV contrast (7ml Gadavist)
Comparison: Abdomen pelvis CT 10/29/2019
COMPARISON: Abdomen pelvis CT 10/29/2019

Addendum:
CLINICAL DATA: Right upper quadrant abdominal pain with liver
lesion.

EXAM:
MRI ABDOMEN WITHOUT AND WITH CONTRAST
TECHNIQUE: Multiplanar multisequence MR imaging of the abdomen was performed
both before and after the administration of intravenous contrast.
CONTRAST:  7mL GADAVIST GADOBUTROL 1 MMOL/ML IV SOLN

[Series 4: T2 · coronal · 6.0mm · 1.19mm/px · 2 of 30 slices shown (1 of 2)]
[im 1/30]
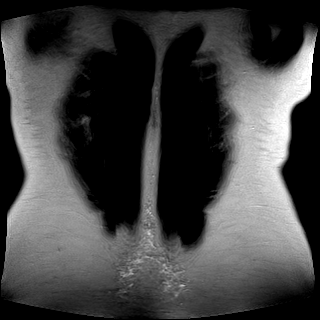
[im 30/30]
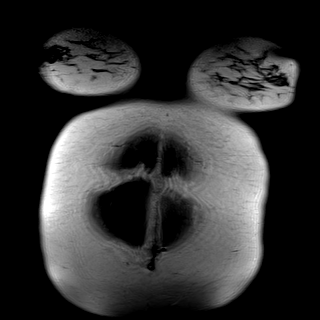

[Series 5: T2 · axial · 6.0mm · 1.19mm/px · z∈[+60,+283]mm · 2 of 32 slices shown (2 of 2)]
[im 1/32]
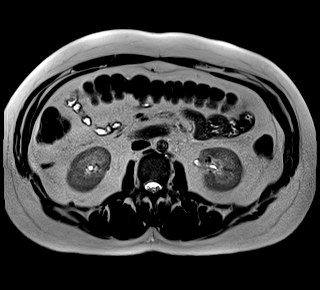
[im 32/32]
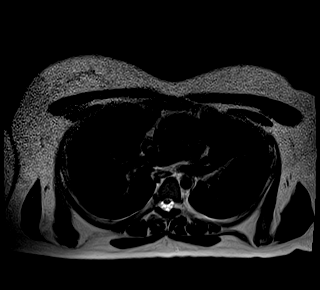

[Series 7: T2 fat-sat · axial · 6.0mm · 1.19mm/px · z∈[+53,+290]mm · 2 of 34 slices shown]
[im 1/34]
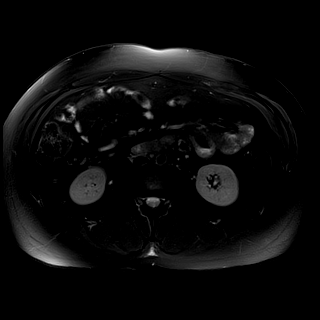
[im 34/34]
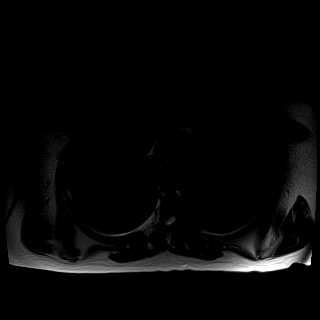

[Series 8: T1 · axial · 6.0mm · 0.74mm/px · z∈[+60,+283]mm · 2 of 32 slices shown]
[im 1/32]
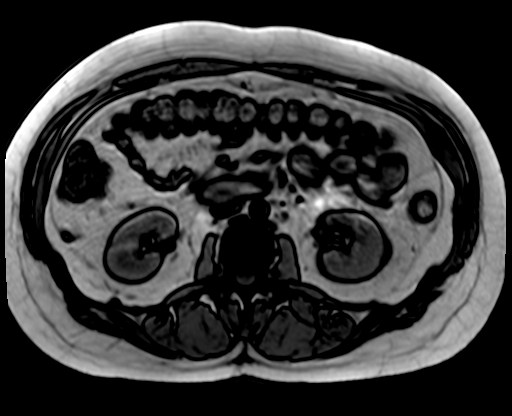
[im 32/32]
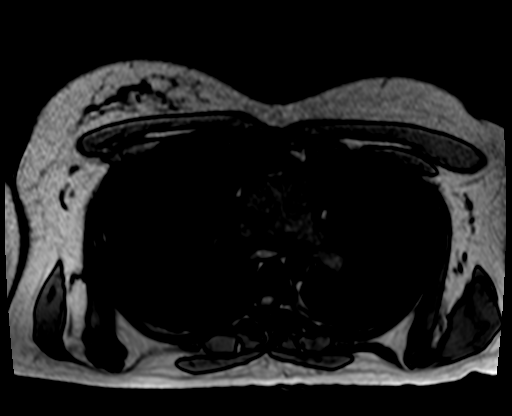

[Series 9: ax dwi_tracew · axial · 6.0mm · 1.42mm/px · z∈[+53,+290]mm · 5 of 102 slices shown]
[im 1/102]
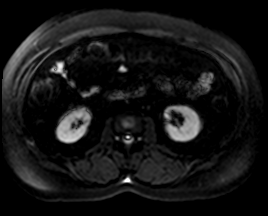
[im 26/102]
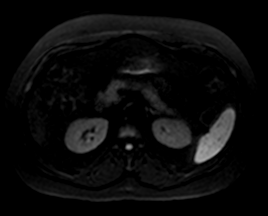
[im 51/102]
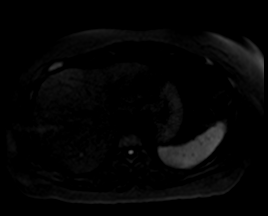
[im 76/102]
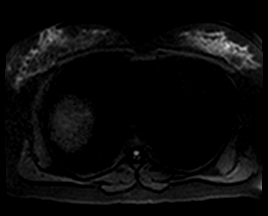
[im 102/102]
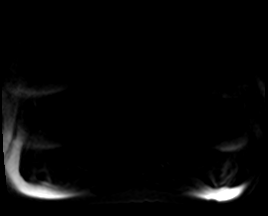

[Series 10: ax dwi_adc · axial · 6.0mm · 1.42mm/px · z∈[+53,+290]mm · 2 of 34 slices shown]
[im 1/34]
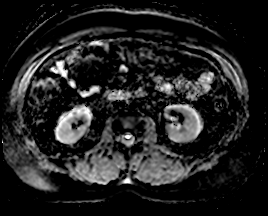
[im 34/34]
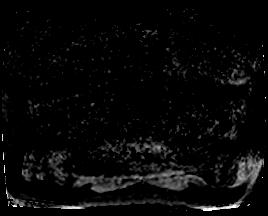

[Series 11: bSSFP · axial · 6.0mm · 0.74mm/px · z∈[+60,+283]mm · 2 of 32 slices shown]
[im 1/32]
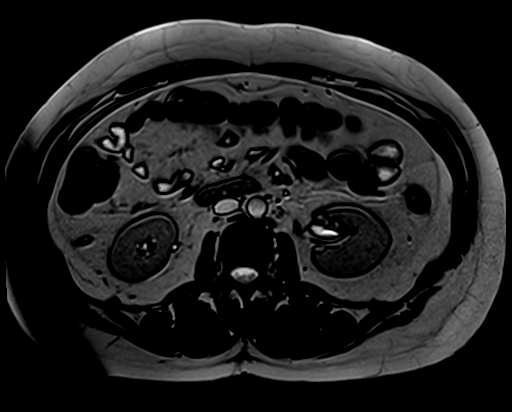
[im 32/32]
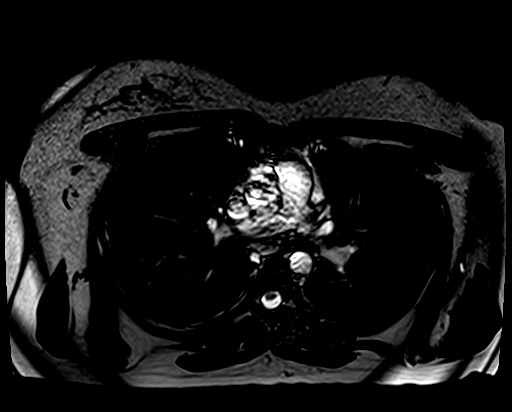

[Series 12: T1 dynamic fat-sat · axial · non-contrast · 3.0mm · 1.19mm/px · z∈[+53,+290]mm · 3 of 80 slices shown (1 of 4)]
[im 1/80]
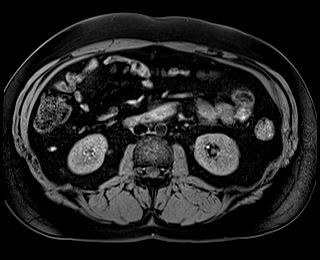
[im 40/80]
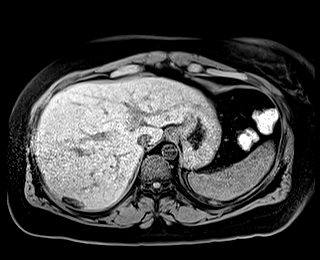
[im 80/80]
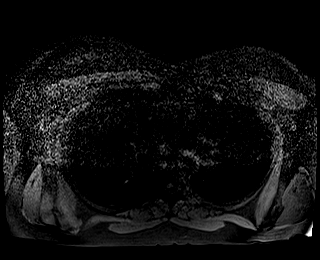

[Series 13: T1 dynamic fat-sat post-contrast · axial · 3.0mm · 1.19mm/px · z∈[+53,+290]mm · 3 of 80 slices shown (1 of 4)]
[im 1/80]
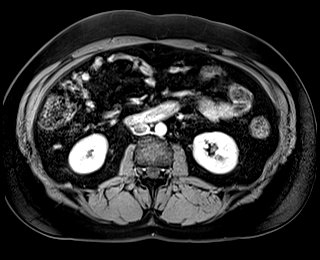
[im 40/80]
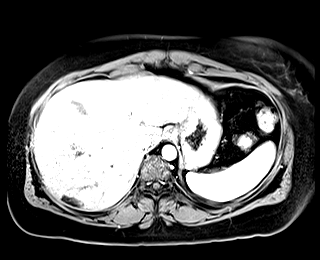
[im 80/80]
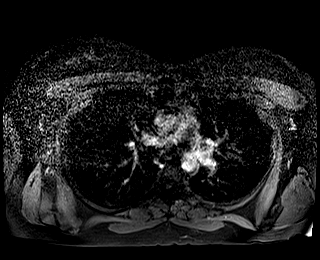

[Series 14: T1 dynamic fat-sat · axial · 3.0mm · 1.19mm/px · z∈[+53,+290]mm · 3 of 80 slices shown (2 of 4)]
[im 1/80]
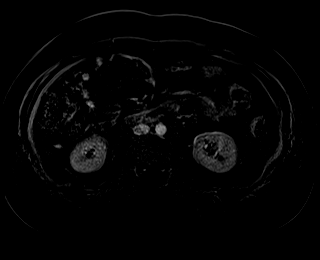
[im 40/80]
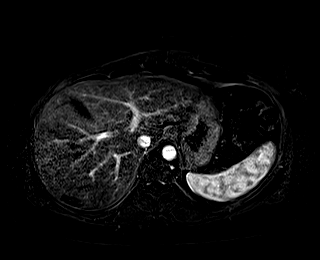
[im 80/80]
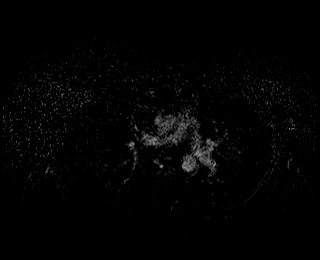

[Series 15: T1 dynamic fat-sat post-contrast · axial · 3.0mm · 1.19mm/px · z∈[+53,+290]mm · 3 of 80 slices shown (2 of 4)]
[im 1/80]
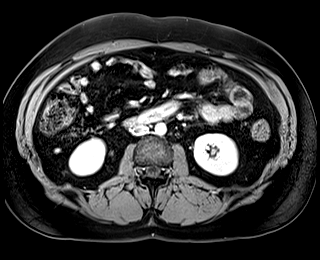
[im 40/80]
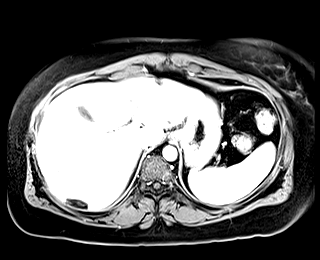
[im 80/80]
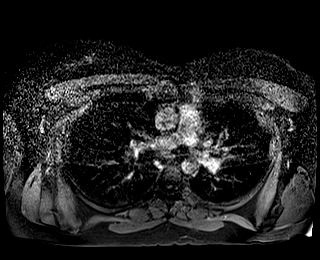

[Series 16: T1 dynamic fat-sat · axial · 3.0mm · 1.19mm/px · z∈[+53,+290]mm · 3 of 80 slices shown (3 of 4)]
[im 1/80]
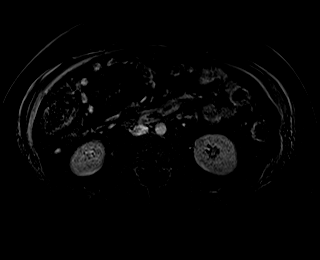
[im 40/80]
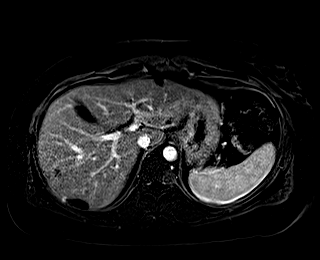
[im 80/80]
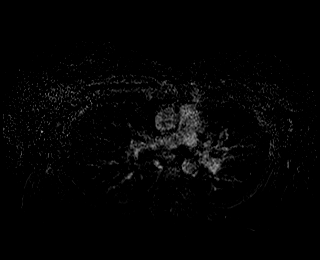

[Series 17: T1 dynamic fat-sat post-contrast · axial · 3.0mm · 1.19mm/px · z∈[+53,+290]mm · 3 of 80 slices shown (3 of 4)]
[im 1/80]
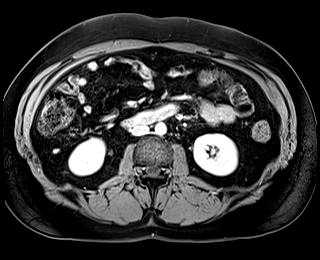
[im 40/80]
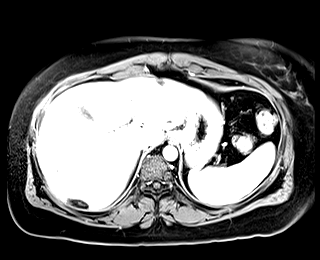
[im 80/80]
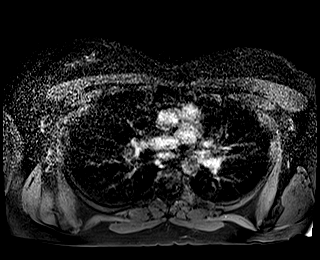

[Series 18: T1 dynamic fat-sat · axial · 3.0mm · 1.19mm/px · z∈[+53,+290]mm · 3 of 80 slices shown (4 of 4)]
[im 1/80]
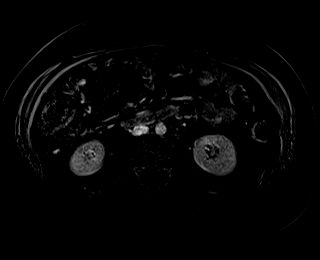
[im 40/80]
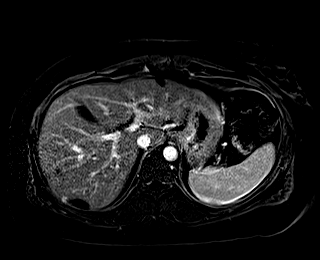
[im 80/80]
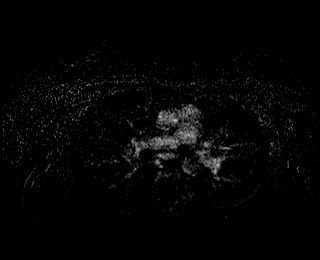

[Series 19: T1 dynamic post-contrast · coronal · 3.0mm · 1.31mm/px · 3 of 72 slices shown]
[im 1/72]
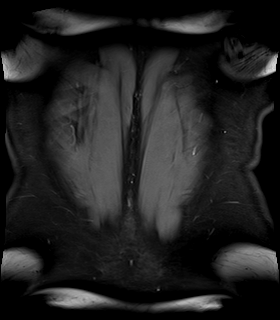
[im 36/72]
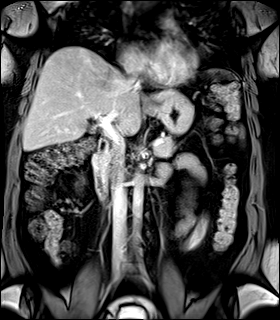
[im 72/72]
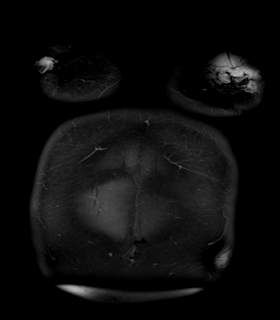

[Series 20: T1 dynamic fat-sat post-contrast · axial · 3.0mm · 1.19mm/px · z∈[+53,+290]mm · 3 of 80 slices shown (4 of 4)]
[im 1/80]
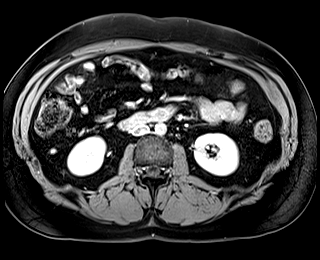
[im 40/80]
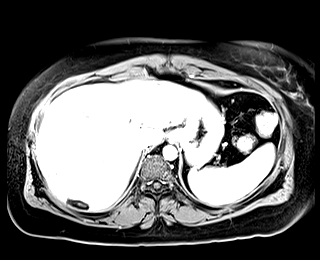
[im 80/80]
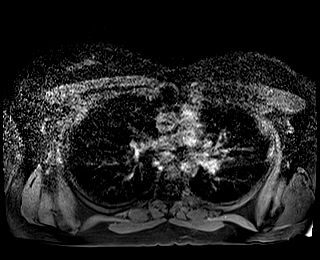

[44 of 48 positions shown; findings below may reference images not displayed]

FINDINGS: Lower chest: Unremarkable

Hepatobiliary: Scattered tiny cysts are identified in the liver
parenchyma, measuring up to 13 mm in the lateral segment left liver.
Dense posterior subcapsular calcification has benign characteristics
and is likely infectious/inflammatory. There is no evidence for
gallstones, gallbladder wall thickening, or pericholecystic fluid.
No intrahepatic or extrahepatic biliary dilation.

Pancreas: No focal mass lesion. No dilatation of the main duct. No
intraparenchymal cyst. No peripancreatic edema.

Spleen:  No splenomegaly. No focal mass lesion.

Adrenals/Urinary Tract: No adrenal nodule or mass. Kidneys
unremarkable.

Stomach/Bowel: Stomach is unremarkable. No gastric wall thickening.
No evidence of outlet obstruction. Duodenum is normally positioned
as is the ligament of Treitz. No small bowel or colonic dilatation
within the visualized abdomen.

Vascular/Lymphatic: No abdominal aortic aneurysm. There is no
gastrohepatic or hepatoduodenal ligament lymphadenopathy. No
retroperitoneal or mesenteric lymphadenopathy.

Other:  No intraperitoneal free fluid.

Musculoskeletal: No abnormal marrow enhancement within the
visualized bony anatomy.
IMPRESSION: Multiple scattered tiny liver lesions compatible with cysts. No
suspicious hepatic parenchymal abnormality on today's study.

Benign appearing subcapsular calcification in the posterior right
liver likely infectious/inflammatory.

ADDENDUM:
For clarification, the multiple scattered tiny hepatic cysts are new
since previous MRI of 10/06/2010 and not substantially changed since
CT scan from 2 days ago. Today's MRI suggests that these cysts are
simple as there is no internal architecture and no rim enhancement.
While infectious etiology is not excluded, it is considered unlikely
by MR imaging.

*** End of Addendum ***
FINDINGS: Lower chest: Unremarkable

Hepatobiliary: Scattered tiny cysts are identified in the liver
parenchyma, measuring up to 13 mm in the lateral segment left liver.
Dense posterior subcapsular calcification has benign characteristics
and is likely infectious/inflammatory. There is no evidence for
gallstones, gallbladder wall thickening, or pericholecystic fluid.
No intrahepatic or extrahepatic biliary dilation.

Pancreas: No focal mass lesion. No dilatation of the main duct. No
intraparenchymal cyst. No peripancreatic edema.

Spleen:  No splenomegaly. No focal mass lesion.

Adrenals/Urinary Tract: No adrenal nodule or mass. Kidneys
unremarkable.

Stomach/Bowel: Stomach is unremarkable. No gastric wall thickening.
No evidence of outlet obstruction. Duodenum is normally positioned
as is the ligament of Treitz. No small bowel or colonic dilatation
within the visualized abdomen.

Vascular/Lymphatic: No abdominal aortic aneurysm. There is no
gastrohepatic or hepatoduodenal ligament lymphadenopathy. No
retroperitoneal or mesenteric lymphadenopathy.

Other:  No intraperitoneal free fluid.

Musculoskeletal: No abnormal marrow enhancement within the
visualized bony anatomy.
IMPRESSION: Multiple scattered tiny liver lesions compatible with cysts. No
suspicious hepatic parenchymal abnormality on today's study.

Benign appearing subcapsular calcification in the posterior right
liver likely infectious/inflammatory.

## 2021-03-12 ENCOUNTER — Telehealth: Payer: Self-pay

## 2021-03-12 NOTE — Telephone Encounter (Signed)
Placed call to pt to see if she is still taking Lorazepam. LMTCB

## 2021-03-19 DIAGNOSIS — R251 Tremor, unspecified: Secondary | ICD-10-CM | POA: Insufficient documentation

## 2021-04-22 ENCOUNTER — Encounter: Payer: Self-pay | Admitting: Adult Health

## 2021-04-22 ENCOUNTER — Telehealth (INDEPENDENT_AMBULATORY_CARE_PROVIDER_SITE_OTHER): Payer: 59 | Admitting: Adult Health

## 2021-04-22 ENCOUNTER — Other Ambulatory Visit: Payer: Self-pay | Admitting: Adult Health

## 2021-04-22 VITALS — Ht 62.99 in | Wt 185.0 lb

## 2021-04-22 DIAGNOSIS — G43829 Menstrual migraine, not intractable, without status migrainosus: Secondary | ICD-10-CM

## 2021-04-22 DIAGNOSIS — G43109 Migraine with aura, not intractable, without status migrainosus: Secondary | ICD-10-CM

## 2021-04-22 DIAGNOSIS — E538 Deficiency of other specified B group vitamins: Secondary | ICD-10-CM

## 2021-04-22 DIAGNOSIS — J4540 Moderate persistent asthma, uncomplicated: Secondary | ICD-10-CM

## 2021-04-22 DIAGNOSIS — E559 Vitamin D deficiency, unspecified: Secondary | ICD-10-CM

## 2021-04-22 DIAGNOSIS — E611 Iron deficiency: Secondary | ICD-10-CM | POA: Diagnosis not present

## 2021-04-22 DIAGNOSIS — F419 Anxiety disorder, unspecified: Secondary | ICD-10-CM

## 2021-04-22 DIAGNOSIS — R21 Rash and other nonspecific skin eruption: Secondary | ICD-10-CM | POA: Diagnosis not present

## 2021-04-22 DIAGNOSIS — R569 Unspecified convulsions: Secondary | ICD-10-CM

## 2021-04-22 DIAGNOSIS — Z87440 Personal history of urinary (tract) infections: Secondary | ICD-10-CM

## 2021-04-22 DIAGNOSIS — L089 Local infection of the skin and subcutaneous tissue, unspecified: Secondary | ICD-10-CM

## 2021-04-22 DIAGNOSIS — Z8639 Personal history of other endocrine, nutritional and metabolic disease: Secondary | ICD-10-CM

## 2021-04-22 MED ORDER — CYCLOBENZAPRINE HCL 10 MG PO TABS
ORAL_TABLET | ORAL | 0 refills | Status: DC
Start: 2021-04-22 — End: 2021-06-11

## 2021-04-22 MED ORDER — SYMBICORT 80-4.5 MCG/ACT IN AERO: 2.0000 | INHALATION_SPRAY | Freq: Two times a day (BID) | RESPIRATORY_TRACT | 6 refills | Status: DC

## 2021-04-22 MED ORDER — CLINDAMYCIN HCL 150 MG PO CAPS: 150.0000 mg | ORAL_CAPSULE | Freq: Three times a day (TID) | ORAL | 0 refills | Status: DC

## 2021-04-22 MED ORDER — FLUCONAZOLE 150 MG PO TABS: 150.0000 mg | ORAL_TABLET | ORAL | 0 refills | Status: DC

## 2021-04-22 MED ORDER — METHYLPREDNISOLONE 4 MG PO TBPK: ORAL_TABLET | ORAL | 1 refills | Status: DC

## 2021-04-22 MED ORDER — ONDANSETRON 8 MG PO TBDP: 4.0000 mg | ORAL_TABLET | Freq: Three times a day (TID) | ORAL | 0 refills | Status: DC | PRN

## 2021-04-22 MED ORDER — KETOROLAC TROMETHAMINE 10 MG PO TABS: 5.0000 mg | ORAL_TABLET | Freq: Three times a day (TID) | ORAL | 0 refills | Status: DC | PRN

## 2021-04-22 MED ORDER — SUMATRIPTAN SUCCINATE 50 MG PO TABS: ORAL_TABLET | ORAL | 0 refills | Status: DC

## 2021-04-22 MED ORDER — MECLIZINE HCL 25 MG PO TABS: 12.5000 mg | ORAL_TABLET | Freq: Three times a day (TID) | ORAL | 0 refills | Status: DC | PRN

## 2021-04-22 MED ORDER — LORAZEPAM 1 MG PO TABS: 1.0000 mg | ORAL_TABLET | Freq: Four times a day (QID) | ORAL | 0 refills | Status: DC | PRN

## 2021-04-22 MED ORDER — FEROSUL 325 (65 FE) MG PO TABS: 325.0000 mg | ORAL_TABLET | ORAL | 0 refills | Status: DC

## 2021-04-22 NOTE — Progress Notes (Signed)
Virtual Visit via Telephone Note  I connected with [name] on 04/22/21 at 11:00 AM EST by telephone and verified that I am speaking with the correct person using two identifiers.   I discussed the limitations, risks, security and privacy concerns of performing an evaluation and management service by telephone and the availability of in person appointments. I also discussed with the patient that there may be a patient responsible charge related to this service. The patient expressed understanding and agreed to proceed.   History of Present Illness: Patient with rash on upper torso and arms. Onset 04/03/21. She reports it is clearing.  Last B12 injection 04/08/21.  She has small pustules that come up intermittent.   She also needs chronic medication refills.  Lexapro she never started feels she does better with PRN medication last ativan script was 12/2020 she takes only as needed.  She has had a few spots on her face described more like small acne. Denies any new medications prescription or over the counter, denies any new soaps, exposures.  She has been seen at [facility] and diagnosed with functional tremor, however her description of witnessed event lasting over 3- 5 minutes where she is unable to speak or communicate with tremors is concerning for seizure activity. She has had MRI done would like her to get EEG and further work up by neurology will place order. Denies any loss of consciousness or preceding events. Last episode one month ago. 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  Patient  denies any fever, body aches,chills, chest pain, shortness of breath, nausea, vomiting, or diarrhea.   Denies any recent illness.  Observations/Objective:   Patient is alert and oriented and responsive to questions Engages in conversation with provider. Speaks in full sentences without any pauses without any shortness of breath or distress.    Pustule like rash on torso/  bilateral arms. Some areas appear to be healing. - mild to moderate rash.   Assessment and Plan:  Rash and nonspecific skin eruption - Plan: clindamycin (CLEOCIN) 150 MG capsule, methylPREDNISolone (MEDROL DOSEPAK) 4 MG TBPK tablet  Skin pustule - Plan: clindamycin (CLEOCIN) 150 MG capsule, methylPREDNISolone (MEDROL DOSEPAK) 4 MG TBPK tablet  Iron deficiency - Plan: Iron, TIBC and Ferritin Panel  Menstrual migraine without status migrainosus, not intractable - Plan: Ambulatory referral to Neurology  Witnessed seizure-like activity ([place]) - Plan: Ambulatory referral to Neurology  Anxiety - Plan: TSH, LORazepam (ATIVAN) 1 MG tablet  History of recurrent UTIs - Plan: CULTURE, URINE COMPREHENSIVE  Vitamin D deficiency - Plan: VITAMIN D 25 Hydroxy (Vit-D Deficiency, Fractures)  History of iron deficiency - Plan: CBC with Differential/Platelet, Comprehensive metabolic panel  U82 deficiency - Plan: B12 and Folate Panel   Follow Up Instructions:  Follow up with dermatology or be seen in person if rash persists.  Did place referral patient would like to be seen in [place] for neurology- migraines and questionable seizure type activity has been seen at [facility] but would like provider closer to home.   Strict ER precaution given.    I discussed the assessment and treatment plan with the patient. The patient was provided an opportunity to ask questions and all were answered. The patient agreed with the plan and demonstrated an understanding of the instructions.   The patient was advised to call back or seek an in-person evaluation if the symptoms worsen or if the condition fails to improve as anticipated.  I provided 22 minutes of non-face-to-face  time during this encounter.   Marcille Buffy, FNP

## 2021-04-23 ENCOUNTER — Encounter: Payer: Self-pay | Admitting: Adult Health

## 2021-04-23 NOTE — Addendum Note (Signed)
Addended by: Leeanne Rio on: 04/23/2021 09:28 AM   Modules accepted: Orders

## 2021-04-23 NOTE — Addendum Note (Signed)
Addended by: Doreen Beam on: 04/23/2021 01:36 PM   Modules accepted: Orders

## 2021-04-23 NOTE — Addendum Note (Signed)
Addended by: Doreen Beam on: 04/23/2021 01:42 PM   Modules accepted: Orders

## 2021-04-24 LAB — COMPREHENSIVE METABOLIC PANEL
ALT: 20 IU/L (ref 0–32)
AST: 16 IU/L (ref 0–40)
Albumin/Globulin Ratio: 2 (ref 1.2–2.2)
Albumin: 4.5 g/dL (ref 3.8–4.8)
Alkaline Phosphatase: 86 IU/L (ref 44–121)
BUN/Creatinine Ratio: 15 (ref 9–23)
BUN: 11 mg/dL (ref 6–20)
Bilirubin Total: 0.4 mg/dL (ref 0.0–1.2)
CO2: 25 mmol/L (ref 20–29)
Calcium: 9.6 mg/dL (ref 8.7–10.2)
Chloride: 103 mmol/L (ref 96–106)
Creatinine, Ser: 0.71 mg/dL (ref 0.57–1.00)
Globulin, Total: 2.3 g/dL (ref 1.5–4.5)
Glucose: 82 mg/dL (ref 70–99)
Potassium: 4.2 mmol/L (ref 3.5–5.2)
Sodium: 140 mmol/L (ref 134–144)
Total Protein: 6.8 g/dL (ref 6.0–8.5)
eGFR: 114 mL/min/{1.73_m2} (ref >59)

## 2021-04-24 LAB — CBC WITH DIFFERENTIAL/PLATELET
Basophils Absolute: 0.1 10*3/uL (ref 0.0–0.2)
Basos: 1 %
EOS (ABSOLUTE): 0.2 10*3/uL (ref 0.0–0.4)
Eos: 2 %
Hematocrit: 38.8 % (ref 34.0–46.6)
Hemoglobin: 12.6 g/dL (ref 11.1–15.9)
Immature Grans (Abs): 0 10*3/uL (ref 0.0–0.1)
Immature Granulocytes: 0 %
Lymphocytes Absolute: 2.8 10*3/uL (ref 0.7–3.1)
Lymphs: 31 %
MCH: 26.9 pg (ref 26.6–33.0)
MCHC: 32.5 g/dL (ref 31.5–35.7)
MCV: 83 fL (ref 79–97)
Monocytes Absolute: 0.9 10*3/uL (ref 0.1–0.9)
Monocytes: 10 %
Neutrophils Absolute: 5 10*3/uL (ref 1.4–7.0)
Neutrophils: 56 %
Platelets: 524 10*3/uL — ABNORMAL HIGH (ref 150–450)
RBC: 4.68 x10E6/uL (ref 3.77–5.28)
RDW: 12.9 % (ref 11.7–15.4)
WBC: 8.9 10*3/uL (ref 3.4–10.8)

## 2021-04-24 LAB — IRON,TIBC AND FERRITIN PANEL
Ferritin: 15 ng/mL (ref 15–150)
Iron Saturation: 8 % — CL (ref 15–55)
Iron: 35 ug/dL (ref 27–159)
Total Iron Binding Capacity: 427 ug/dL (ref 250–450)
UIBC: 392 ug/dL (ref 131–425)

## 2021-04-24 LAB — TSH: TSH: 1.58 u[IU]/mL (ref 0.450–4.500)

## 2021-04-24 LAB — VITAMIN D 25 HYDROXY (VIT D DEFICIENCY, FRACTURES): Vit D, 25-Hydroxy: 31.8 ng/mL (ref 30.0–100.0)

## 2021-04-24 LAB — B12 AND FOLATE PANEL
Folate: 6.4 ng/mL (ref >3.0)
Vitamin B-12: 1100 pg/mL (ref 232–1245)

## 2021-04-24 NOTE — Telephone Encounter (Signed)
Labs done and resulted

## 2021-04-29 ENCOUNTER — Encounter: Payer: Self-pay | Admitting: Adult Health

## 2021-04-29 DIAGNOSIS — Z8616 Personal history of COVID-19: Secondary | ICD-10-CM

## 2021-04-29 DIAGNOSIS — R52 Pain, unspecified: Secondary | ICD-10-CM

## 2021-04-29 DIAGNOSIS — E569 Vitamin deficiency, unspecified: Secondary | ICD-10-CM

## 2021-04-29 DIAGNOSIS — Z87440 Personal history of urinary (tract) infections: Secondary | ICD-10-CM

## 2021-04-29 DIAGNOSIS — R5383 Other fatigue: Secondary | ICD-10-CM

## 2021-04-29 DIAGNOSIS — D509 Iron deficiency anemia, unspecified: Secondary | ICD-10-CM

## 2021-04-29 DIAGNOSIS — Z20828 Contact with and (suspected) exposure to other viral communicable diseases: Secondary | ICD-10-CM

## 2021-04-29 LAB — CULTURE, URINE COMPREHENSIVE

## 2021-04-29 NOTE — Progress Notes (Signed)
Mixed normal flora,  no treatment indicated.  Follow up if needed.

## 2021-04-30 ENCOUNTER — Other Ambulatory Visit: Payer: Self-pay | Admitting: Adult Health

## 2021-04-30 MED ORDER — DOXYCYCLINE HYCLATE 100 MG PO TABS: 100.0000 mg | ORAL_TABLET | Freq: Two times a day (BID) | ORAL | 0 refills | Status: DC

## 2021-05-04 ENCOUNTER — Other Ambulatory Visit: Payer: Self-pay

## 2021-05-04 DIAGNOSIS — E559 Vitamin D deficiency, unspecified: Secondary | ICD-10-CM

## 2021-05-04 MED ORDER — VITAMIN D (ERGOCALCIFEROL) 1.25 MG (50000 UNIT) PO CAPS: 50000.0000 [IU] | ORAL_CAPSULE | ORAL | 0 refills | Status: DC

## 2021-05-13 ENCOUNTER — Other Ambulatory Visit: Payer: Self-pay | Admitting: Family

## 2021-05-18 ENCOUNTER — Other Ambulatory Visit: Payer: Self-pay | Admitting: Adult Health

## 2021-05-18 DIAGNOSIS — E785 Hyperlipidemia, unspecified: Secondary | ICD-10-CM

## 2021-05-23 LAB — LIPID PANEL W/O CHOL/HDL RATIO
Cholesterol, Total: 170 mg/dL (ref 100–199)
HDL: 50 mg/dL (ref >39)
LDL Chol Calc (NIH): 95 mg/dL (ref 0–99)
Triglycerides: 140 mg/dL (ref 0–149)
VLDL Cholesterol Cal: 25 mg/dL (ref 5–40)

## 2021-05-23 LAB — URINALYSIS, MICROSCOPIC ONLY
Bacteria, UA: NONE SEEN
Casts: NONE SEEN /lpf
Epithelial Cells (non renal): NONE SEEN /hpf (ref 0–10)
WBC, UA: NONE SEEN /hpf (ref 0–5)

## 2021-05-24 NOTE — Progress Notes (Signed)
Iron saturation levels are improving, please keep taking iron as directed at last lab.  Platelets still elevated but are improving with iron. Magnesium is within normal limits.  Covid antibodies and zinc  still pending result will result once received.  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  Good news urinalysis is within normal limits.   Please add labs for patient will need recheck CBC and iron TIBC ferritin in 3 months,uses [facility].

## 2021-05-25 ENCOUNTER — Other Ambulatory Visit: Payer: Self-pay | Admitting: Adult Health

## 2021-05-25 ENCOUNTER — Telehealth: Payer: Self-pay

## 2021-05-25 DIAGNOSIS — U099 Post covid-19 condition, unspecified: Secondary | ICD-10-CM

## 2021-05-25 DIAGNOSIS — L709 Acne, unspecified: Secondary | ICD-10-CM

## 2021-05-25 DIAGNOSIS — R7989 Other specified abnormal findings of blood chemistry: Secondary | ICD-10-CM

## 2021-05-25 DIAGNOSIS — D509 Iron deficiency anemia, unspecified: Secondary | ICD-10-CM

## 2021-05-25 MED ORDER — DOXYCYCLINE HYCLATE 100 MG PO TABS: 100.0000 mg | ORAL_TABLET | Freq: Two times a day (BID) | ORAL | 0 refills | Status: DC

## 2021-05-25 NOTE — Progress Notes (Signed)
Meds ordered this encounter  Medications   doxycycline (VIBRA-TABS) 100 MG tablet    Sig: Take 1 tablet (100 mg total) by mouth 2 (two) times daily.    Dispense:  180 tablet    Refill:  0  Adult acne - Plan: doxycycline (VIBRA-TABS) 100 MG tablet

## 2021-05-25 NOTE — Telephone Encounter (Signed)
Placed orders for CBC w/ diff and iron, TIBC and ferritin for future labs

## 2021-05-26 LAB — URINE CULTURE

## 2021-05-26 LAB — IRON,TIBC AND FERRITIN PANEL
Ferritin: 14 ng/mL — ABNORMAL LOW (ref 15–150)
Iron Saturation: 13 % — ABNORMAL LOW (ref 15–55)
Iron: 57 ug/dL (ref 27–159)
Total Iron Binding Capacity: 439 ug/dL (ref 250–450)
UIBC: 382 ug/dL (ref 131–425)

## 2021-05-26 LAB — CBC WITH DIFFERENTIAL/PLATELET
Basophils Absolute: 0.1 10*3/uL (ref 0.0–0.2)
Basos: 1 %
EOS (ABSOLUTE): 0.1 10*3/uL (ref 0.0–0.4)
Eos: 2 %
Hematocrit: 39.9 % (ref 34.0–46.6)
Hemoglobin: 12.9 g/dL (ref 11.1–15.9)
Immature Grans (Abs): 0 10*3/uL (ref 0.0–0.1)
Immature Granulocytes: 0 %
Lymphocytes Absolute: 2.5 10*3/uL (ref 0.7–3.1)
Lymphs: 29 %
MCH: 26.6 pg (ref 26.6–33.0)
MCHC: 32.3 g/dL (ref 31.5–35.7)
MCV: 82 fL (ref 79–97)
Monocytes Absolute: 0.7 10*3/uL (ref 0.1–0.9)
Monocytes: 8 %
Neutrophils Absolute: 5.3 10*3/uL (ref 1.4–7.0)
Neutrophils: 60 %
Platelets: 504 10*3/uL — ABNORMAL HIGH (ref 150–450)
RBC: 4.85 x10E6/uL (ref 3.77–5.28)
RDW: 12.6 % (ref 11.7–15.4)
WBC: 8.7 10*3/uL (ref 3.4–10.8)

## 2021-05-26 LAB — SAR COV2 SEROLOGY (COVID19)AB(IGG),IA
SARS-CoV-2 Semi-Quant IgG Ab: 40.3 AU/mL (ref <13.0)
SARS-CoV-2 Spike Ab Interp: POSITIVE

## 2021-05-26 LAB — SARS-COV-2 SEMI-QUANTITATIVE TOTAL ANTIBODY, SPIKE
SARS-CoV-2 Semi-Quant Total Ab: 19.5 U/mL (ref <0.8)
SARS-CoV-2 Spike Ab Interp: POSITIVE

## 2021-05-26 LAB — ZINC: Zinc: 106 ug/dL (ref 44–115)

## 2021-05-26 LAB — EPSTEIN-BARR VIRUS NUCLEAR ANTIGEN ANTIBODY, IGG: EBV NA IgG: 310 U/mL — ABNORMAL HIGH (ref 0.0–17.9)

## 2021-05-26 LAB — MAGNESIUM: Magnesium: 2 mg/dL (ref 1.6–2.3)

## 2021-05-26 NOTE — Progress Notes (Signed)
Antibodies for covid are positive.

## 2021-05-27 NOTE — Progress Notes (Signed)
Zinc level within normal limits.

## 2021-05-29 ENCOUNTER — Other Ambulatory Visit: Payer: Self-pay

## 2021-05-29 ENCOUNTER — Telehealth: Payer: Self-pay

## 2021-05-29 NOTE — Telephone Encounter (Signed)
LMTCB for lab results.  

## 2021-06-10 ENCOUNTER — Encounter: Payer: Self-pay | Admitting: Adult Health

## 2021-06-10 DIAGNOSIS — N838 Other noninflammatory disorders of ovary, fallopian tube and broad ligament: Secondary | ICD-10-CM

## 2021-06-10 DIAGNOSIS — N83291 Other ovarian cyst, right side: Secondary | ICD-10-CM

## 2021-06-10 DIAGNOSIS — R1031 Right lower quadrant pain: Secondary | ICD-10-CM

## 2021-06-10 DIAGNOSIS — N83512 Torsion of left ovary and ovarian pedicle: Secondary | ICD-10-CM

## 2021-06-11 ENCOUNTER — Ambulatory Visit
Admission: RE | Admit: 2021-06-11 | Discharge: 2021-06-11 | Disposition: A | Discharge status: Home / Self Care | Payer: 59 | Source: Ambulatory Visit | Attending: Adult Health | Admitting: Adult Health

## 2021-06-11 ENCOUNTER — Other Ambulatory Visit: Payer: Self-pay

## 2021-06-11 ENCOUNTER — Telehealth (INDEPENDENT_AMBULATORY_CARE_PROVIDER_SITE_OTHER): Payer: 59 | Admitting: Adult Health

## 2021-06-11 VITALS — Ht 62.99 in | Wt 185.0 lb

## 2021-06-11 DIAGNOSIS — G43829 Menstrual migraine, not intractable, without status migrainosus: Secondary | ICD-10-CM

## 2021-06-11 DIAGNOSIS — N83291 Other ovarian cyst, right side: Secondary | ICD-10-CM | POA: Insufficient documentation

## 2021-06-11 DIAGNOSIS — T7840XS Allergy, unspecified, sequela: Secondary | ICD-10-CM | POA: Diagnosis not present

## 2021-06-11 DIAGNOSIS — D649 Anemia, unspecified: Secondary | ICD-10-CM

## 2021-06-11 DIAGNOSIS — B379 Candidiasis, unspecified: Secondary | ICD-10-CM

## 2021-06-11 DIAGNOSIS — T452X5D Adverse effect of vitamins, subsequent encounter: Secondary | ICD-10-CM

## 2021-06-11 DIAGNOSIS — N838 Other noninflammatory disorders of ovary, fallopian tube and broad ligament: Secondary | ICD-10-CM | POA: Diagnosis present

## 2021-06-11 DIAGNOSIS — J4540 Moderate persistent asthma, uncomplicated: Secondary | ICD-10-CM

## 2021-06-11 DIAGNOSIS — T3695XA Adverse effect of unspecified systemic antibiotic, initial encounter: Secondary | ICD-10-CM

## 2021-06-11 DIAGNOSIS — E559 Vitamin D deficiency, unspecified: Secondary | ICD-10-CM

## 2021-06-11 DIAGNOSIS — R1031 Right lower quadrant pain: Secondary | ICD-10-CM | POA: Diagnosis present

## 2021-06-11 DIAGNOSIS — G43109 Migraine with aura, not intractable, without status migrainosus: Secondary | ICD-10-CM

## 2021-06-11 DIAGNOSIS — N83512 Torsion of left ovary and ovarian pedicle: Secondary | ICD-10-CM | POA: Insufficient documentation

## 2021-06-11 DIAGNOSIS — R112 Nausea with vomiting, unspecified: Secondary | ICD-10-CM

## 2021-06-11 DIAGNOSIS — Z6832 Body mass index (BMI) 32.0-32.9, adult: Secondary | ICD-10-CM

## 2021-06-11 DIAGNOSIS — Z79899 Other long term (current) drug therapy: Secondary | ICD-10-CM

## 2021-06-11 DIAGNOSIS — R21 Rash and other nonspecific skin eruption: Secondary | ICD-10-CM

## 2021-06-11 DIAGNOSIS — F419 Anxiety disorder, unspecified: Secondary | ICD-10-CM

## 2021-06-11 DIAGNOSIS — R110 Nausea: Secondary | ICD-10-CM

## 2021-06-11 MED ORDER — LORAZEPAM 1 MG PO TABS: 1.0000 mg | ORAL_TABLET | Freq: Four times a day (QID) | ORAL | 0 refills | Status: DC | PRN

## 2021-06-11 MED ORDER — MECLIZINE HCL 25 MG PO TABS: 12.5000 mg | ORAL_TABLET | Freq: Three times a day (TID) | ORAL | 0 refills | Status: DC | PRN

## 2021-06-11 MED ORDER — ONDANSETRON 8 MG PO TBDP: 4.0000 mg | ORAL_TABLET | Freq: Three times a day (TID) | ORAL | 0 refills | Status: DC | PRN

## 2021-06-11 MED ORDER — PANTOPRAZOLE SODIUM 40 MG PO TBEC: DELAYED_RELEASE_TABLET | ORAL | 1 refills | Status: DC

## 2021-06-11 MED ORDER — VITAMIN D (ERGOCALCIFEROL) 1.25 MG (50000 UNIT) PO CAPS: 50000.0000 [IU] | ORAL_CAPSULE | ORAL | 0 refills | Status: AC

## 2021-06-11 MED ORDER — SYMBICORT 80-4.5 MCG/ACT IN AERO: 2.0000 | INHALATION_SPRAY | Freq: Two times a day (BID) | RESPIRATORY_TRACT | 6 refills | Status: AC

## 2021-06-11 MED ORDER — KETOROLAC TROMETHAMINE 10 MG PO TABS: 5.0000 mg | ORAL_TABLET | Freq: Three times a day (TID) | ORAL | 0 refills | Status: DC | PRN

## 2021-06-11 MED ORDER — METHYLPREDNISOLONE 4 MG PO TBPK: ORAL_TABLET | ORAL | 1 refills | Status: AC

## 2021-06-11 MED ORDER — FEROSUL 325 (65 FE) MG PO TABS: 325.0000 mg | ORAL_TABLET | ORAL | 0 refills | Status: DC

## 2021-06-11 MED ORDER — FLUCONAZOLE 150 MG PO TABS: 150.0000 mg | ORAL_TABLET | ORAL | 0 refills | Status: DC

## 2021-06-11 MED ORDER — HYDROXYZINE HCL 50 MG PO TABS: 25.0000 mg | ORAL_TABLET | Freq: Three times a day (TID) | ORAL | 0 refills | Status: DC | PRN

## 2021-06-11 MED ORDER — HYDROCHLOROTHIAZIDE 25 MG PO TABS: 12.5000 mg | ORAL_TABLET | Freq: Every morning | ORAL | 1 refills | Status: AC

## 2021-06-11 MED ORDER — SUMATRIPTAN SUCCINATE 50 MG PO TABS
ORAL_TABLET | ORAL | 0 refills | Status: DC
Start: 2021-06-11 — End: 2021-08-25

## 2021-06-11 MED ORDER — TRIAMCINOLONE ACETONIDE 0.1 % EX CREA: 1.0000 "application " | TOPICAL_CREAM | Freq: Two times a day (BID) | CUTANEOUS | 1 refills | Status: AC

## 2021-06-11 MED ORDER — ALBUTEROL SULFATE HFA 108 (90 BASE) MCG/ACT IN AERS: INHALATION_SPRAY | RESPIRATORY_TRACT | 1 refills | Status: AC

## 2021-06-11 MED ORDER — CYCLOBENZAPRINE HCL 10 MG PO TABS: ORAL_TABLET | ORAL | 0 refills | Status: DC

## 2021-06-11 NOTE — Progress Notes (Signed)
Negative for any  right ovarian torsion or mass of ovary seen.endometrium visualized as within normal.  History of left ovary removed,  If pain persistent need to consider CT rule out appendicitis/ diverticulitis.

## 2021-06-11 NOTE — Progress Notes (Signed)
Virtual Visit via Video Note  I connected with [name] on 06/12/21 at  4:30 PM EST by a video enabled telemedicine application and verified that I am speaking with the correct person using two identifiers.  Location: Patient: at home  Provider: Provider: Provider's office at  [facility], [place].   I discussed the limitations of evaluation and management by telemedicine and the availability of in person appointments. The patient expressed understanding and agreed to proceed.    I discussed the assessment and treatment plan with the patient. The patient was provided an opportunity to ask questions and all were answered. The patient agreed with the plan and demonstrated an understanding of the instructions.   The patient was advised to call back or seek an in-person evaluation if the symptoms worsen or if the condition fails to improve as anticipated.    Ashley Buffy, FNP    Established Patient Office Visit  Subjective:  Patient ID: [name], female    DOB: [birth year]  Age: 35 y.o. MRN: [id number]  CC:  Chief Complaint  Patient presents with   Follow-up    HPI [name] presents for refills on medications.  Had ultrasound of the pelvis and transvaginal that was negative this morning this morning right ovary and was normal. 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  Patient continues to have persistent right lower quadrant pain and tenderness for the last 2 weeks.  She has some associated nausea.  Denies any black or tarry stools denies any mucus or pus.  Denies any bleeding. Patient denies any abdominal trauma.  Left side of face near ear has seen [facility] a few years ago, for area appears to be actinic keratosis on video however video feels fixated.  After referral to dermatology at this time patient prefers to wait and watch due to insurance changing.  Otherwise she feels well today, she  does need chronic refills on medications for history of allergic response in the past we will send her prednisone.  She also is on Toradol as needed for menstrual cramping and pain she is seeing gynecology at [facility] and this was to be continued, she has nausea medication Zofran for migraines.  Is Imitrex as needed will refill she is doing well with these medications. She does keep prednisone on hand as well as Atarax for itching and allergic response, past history of this has been chronic for years no specific trigger with allergy testing she has no chronic acute flares at this time.  She is aware not to take meclizine or Antivert together.  She has Antivert for mild vertigo type symptoms.  She does well with Ativan as needed, she has not done well with any antidepressant in the past and prefers not to take those at this time.  Patient  denies any fever, body aches,chills, rash, chest pain, shortness of breath, vomiting, or diarrhea.   Denies dizziness, lightheadedness, pre syncopal or syncopal episodes.   Patient denies any chance of pregnancy.  No past medical history on file.  Past Surgical History:  Procedure Laterality Date   OVARY SURGERY     URETHRA SURGERY     WISDOM TOOTH EXTRACTION      No family history on file.  Social History   Socioeconomic History   Marital status: Married    Spouse name: Not on file   Number of children: Not on file   Years of education:  Not on file   Highest education level: Not on file  Occupational History   Not on file  Tobacco Use   Smoking status: Never   Smokeless tobacco: Never  Vaping Use   Vaping Use: Never used  Substance and Sexual Activity   Alcohol use: No   Drug use: No   Sexual activity: Not on file  Other Topics Concern   Not on file  Social History Narrative   Not on file   Social Determinants of Health   Financial Resource Strain: Not on file  Food Insecurity: Not on file  Transportation Needs: Not on file  Physical  Activity: Not on file  Stress: Not on file  Social Connections: Not on file  Intimate Partner Violence: Not on file    Outpatient Medications Prior to Visit  Medication Sig Dispense Refill   ascorbic acid (VITAMIN C) 500 MG tablet Take by mouth.     aspirin EC 325 MG tablet Take 325 mg by mouth daily.     cyanocobalamin 1000 MCG tablet Take by mouth.     doxycycline (VIBRA-TABS) 100 MG tablet Take 1 tablet (100 mg total) by mouth 2 (two) times daily. 180 tablet 0   EPINEPHrine 0.3 mg/0.3 mL IJ SOAJ injection Inject into the muscle.     nystatin ointment (MYCOSTATIN) Apply 1 application topically 2 (two) times daily. For fungal rash not for internal use. 30 g 1   albuterol (VENTOLIN HFA) 108 (90 Base) MCG/ACT inhaler INHALE 2 PUFFS BY MOUTH INTO THE LUNGS EVERY 6 HOURS AS NEEDED FOR WHEEZING     cyclobenzaprine (FLEXERIL) 10 MG tablet TAKE 1 TABLET(10 MG) BY MOUTH THREE TIMES DAILY AS NEEDED FOR MUSCLE SPASMS 90 tablet 0   FEROSUL 325 (65 Fe) MG tablet Take 1 tablet (325 mg total) by mouth every other day. 90 tablet 0   hydrochlorothiazide (HYDRODIURIL) 25 MG tablet Take 12.5 mg by mouth every morning.     ketorolac (TORADOL) 10 MG tablet Take 0.5 tablets (5 mg total) by mouth every 8 (eight) hours as needed for moderate pain. 10 tablet 0   LORazepam (ATIVAN) 1 MG tablet Take 1 tablet (1 mg total) by mouth every 6 (six) hours as needed for anxiety. 90 tablet 0   meclizine (ANTIVERT) 25 MG tablet Take 0.5-1 tablets (12.5-25 mg total) by mouth every 8 (eight) hours as needed. 30 tablet 0   ondansetron (ZOFRAN-ODT) 8 MG disintegrating tablet Take 0.5-1 tablets (4-8 mg total) by mouth every 8 (eight) hours as needed for nausea or vomiting (migraine). Take as needed/ directed only. 20 tablet 0   pantoprazole (PROTONIX) 40 MG tablet TAKE 1 TABLET(40 MG) BY MOUTH TWICE DAILY 90 tablet 1   SUMAtriptan (IMITREX) 50 MG tablet TAKE 1 TABLET BY MOUTH AS NEEDED AT ONSET OF SYMPTOMS. MAY REPEAT IN 2 HOURS IF  NEEDED. NO MORE THAN 3 DOSES IN 24 HOURS 10 tablet 0   SYMBICORT 80-4.5 MCG/ACT inhaler Inhale 2 puffs into the lungs 2 (two) times daily. 1 each 6   triamcinolone cream (KENALOG) 0.1 % Apply 1 application topically 2 (two) times daily. Use thin layer only not for genitals or face for extreme irritation 80 g 1   Vitamin D, Ergocalciferol, (DRISDOL) 1.25 MG (50000 UNIT) CAPS capsule Take 1 capsule (50,000 Units total) by mouth every 7 (seven) days. 12 capsule 0   fluconazole (DIFLUCAN) 150 MG tablet Take 1 tablet (150 mg total) by mouth as directed. If needed for antibiotic induced yeast.Take  one tablet by mouth on day 1. May repeat dose of one tablet by mouth on day four. (Patient not taking: Reported on 06/11/2021) 2 tablet 0   No facility-administered medications prior to visit.    Allergies  Allergen Reactions   Benzonatate Shortness Of Breath   Cephalexin Anaphylaxis and Shortness Of Breath   Codeine Swelling and Other (See Comments)    Other reaction Other reaction    Hornet Venom Anaphylaxis   Polyethylene Glycol Anaphylaxis    Reported by patient in past    Prednisone Anaphylaxis and Shortness Of Breath    Received Celestone injection in past per Dr. Jarold Song    Pseudoephedrine Other (See Comments)    CNS Disorder CNS Disorder    Pseudoephedrine Hcl Other (See Comments)    No allergic, per pt   Hydrocodone-Acetaminophen Swelling   Nitrofurantoin Rash    Ulcers on tongue   Oxycodone-Acetaminophen Swelling   Percocet [Oxycodone-Acetaminophen] Swelling   Vicodin [Hydrocodone-Acetaminophen] Swelling    ROS Review of Systems  Constitutional: Negative.   Respiratory: Negative.    Cardiovascular: Negative.   Gastrointestinal:  Positive for abdominal pain and nausea. Negative for abdominal distention, anal bleeding, blood in stool, constipation, diarrhea, rectal pain and vomiting.  Genitourinary:  Positive for flank pain and menstrual problem (sees gynecology). Negative for  decreased urine volume, difficulty urinating, dyspareunia, dysuria, enuresis, frequency, genital sores, hematuria, pelvic pain, urgency, vaginal bleeding, vaginal discharge and vaginal pain.  Musculoskeletal:  Negative for arthralgias, back pain, gait problem, joint swelling, myalgias, neck pain and neck stiffness.  Neurological: Negative.   Psychiatric/Behavioral: Negative.        Objective:    Physical Exam  Patient is alert and oriented and responsive to questions Engages in conversation with provider. Speaks in full sentences without any pauses without any shortness of breath or distress.  See note above on skin lesion, declines, dermatology at this time. Ht 5' 2.99" (1.6 m)    Wt 185 lb (83.9 kg)    LMP 05/29/2021    BMI 32.78 kg/m  Wt Readings from Last 3 Encounters:  06/11/21 185 lb (83.9 kg)  04/22/21 185 lb (83.9 kg)  02/09/21 185 lb (83.9 kg)     Health Maintenance Due  Topic Date Due   Pneumococcal Vaccine 49-24 Years old (1 - PCV) Never done   HIV Screening  Never done   INFLUENZA VACCINE  Never done    There are no preventive care reminders to display for this patient.  Lab Results  Component Value Date   TSH 1.580 04/23/2021   Lab Results  Component Value Date   WBC 8.7 05/22/2021   HGB 12.9 05/22/2021   HCT 39.9 05/22/2021   MCV 82 05/22/2021   PLT 504 (H) 05/22/2021   Lab Results  Component Value Date   NA 140 04/23/2021   K 4.2 04/23/2021   CO2 25 04/23/2021   GLUCOSE 82 04/23/2021   BUN 11 04/23/2021   CREATININE 0.71 04/23/2021   BILITOT 0.4 04/23/2021   ALKPHOS 86 04/23/2021   AST 16 04/23/2021   ALT 20 04/23/2021   PROT 6.8 04/23/2021   ALBUMIN 4.5 04/23/2021   CALCIUM 9.6 04/23/2021   EGFR 114 04/23/2021   Lab Results  Component Value Date   CHOL 170 05/22/2021   Lab Results  Component Value Date   HDL 50 05/22/2021   Lab Results  Component Value Date   LDLCALC 95 05/22/2021   Lab Results  Component Value Date  TRIG 140  05/22/2021   Lab Results  Component Value Date   CHOLHDL 4.2 10/20/2020   No results found for: HGBA1C    Assessment & Plan:  The primary encounter diagnosis was Right lower quadrant abdominal pain. Diagnoses of Anxiety, Allergy, sequela, Migraine with aura and without status migrainosus, not intractable, Vitamin D deficiency, Menstrual migraine without status migrainosus, not intractable, Asthma, allergic, moderate persistent, uncomplicated, Antibiotic-induced yeast infection, Rash and nonspecific skin eruption, RLQ abdominal pain, Body mass index (BMI) of 32.0-32.9 in adult, and Nausea were also pertinent to this visit.   Given normal ultrasound of ovaries and pelvis transvaginal with Doppler patient is still having right lower quadrant pain need to obtain a CT with contrast, patient prefers fast acting contrast.  Need to rule out appendicitis, or possible diverticulitis.  Meds ordered this encounter  Medications   albuterol (VENTOLIN HFA) 108 (90 Base) MCG/ACT inhaler    Sig: INHALE 2 PUFFS BY MOUTH INTO THE LUNGS EVERY 6 HOURS AS NEEDED FOR WHEEZING    Dispense:  18 g    Refill:  1   hydrochlorothiazide (HYDRODIURIL) 25 MG tablet    Sig: Take 0.5 tablets (12.5 mg total) by mouth every morning.    Dispense:  90 tablet    Refill:  1   cyclobenzaprine (FLEXERIL) 10 MG tablet    Sig: TAKE 1 TABLET(10 MG) BY MOUTH THREE TIMES DAILY AS NEEDED FOR MUSCLE SPASMS    Dispense:  90 tablet    Refill:  0   FEROSUL 325 (65 Fe) MG tablet    Sig: Take 1 tablet (325 mg total) by mouth every other day.    Dispense:  90 tablet    Refill:  0   LORazepam (ATIVAN) 1 MG tablet    Sig: Take 1 tablet (1 mg total) by mouth every 6 (six) hours as needed for anxiety.    Dispense:  90 tablet    Refill:  0   ketorolac (TORADOL) 10 MG tablet    Sig: Take 0.5 tablets (5 mg total) by mouth every 8 (eight) hours as needed for moderate pain.    Dispense:  10 tablet    Refill:  0   triamcinolone cream  (KENALOG) 0.1 %    Sig: Apply 1 application topically 2 (two) times daily. Use thin layer only not for genitals or face for extreme irritation    Dispense:  80 g    Refill:  1   ondansetron (ZOFRAN-ODT) 8 MG disintegrating tablet    Sig: Take 0.5-1 tablets (4-8 mg total) by mouth every 8 (eight) hours as needed for nausea or vomiting (migraine). Take as needed/ directed only.    Dispense:  30 tablet    Refill:  0   pantoprazole (PROTONIX) 40 MG tablet    Sig: TAKE 1 TABLET(40 MG) BY MOUTH TWICE DAILY    Dispense:  90 tablet    Refill:  1   Vitamin D, Ergocalciferol, (DRISDOL) 1.25 MG (50000 UNIT) CAPS capsule    Sig: Take 1 capsule (50,000 Units total) by mouth every 7 (seven) days.    Dispense:  12 capsule    Refill:  0   SUMAtriptan (IMITREX) 50 MG tablet    Sig: TAKE 1 TABLET BY MOUTH AS NEEDED AT ONSET OF SYMPTOMS. MAY REPEAT IN 2 HOURS IF NEEDED. NO MORE THAN 3 DOSES IN 24 HOURS    Dispense:  10 tablet    Refill:  0   SYMBICORT 80-4.5 MCG/ACT inhaler  Sig: Inhale 2 puffs into the lungs 2 (two) times daily.    Dispense:  1 each    Refill:  6   meclizine (ANTIVERT) 25 MG tablet    Sig: Take 0.5-1 tablets (12.5-25 mg total) by mouth every 8 (eight) hours as needed.    Dispense:  30 tablet    Refill:  0   fluconazole (DIFLUCAN) 150 MG tablet    Sig: Take 1 tablet (150 mg total) by mouth as directed. Take one tablet by mouth on day 1. May repeat dose of one tablet by mouth on day four.    Dispense:  2 tablet    Refill:  0   hydrOXYzine (ATARAX) 50 MG tablet    Sig: Take 0.5-1 tablets (25-50 mg total) by mouth 3 (three) times daily as needed.    Dispense:  30 tablet    Refill:  0   methylPREDNISolone (MEDROL DOSEPAK) 4 MG TBPK tablet    Sig: .PO: Take 6 tablets on day 1:Take 5 tablets day 2:Take 4 tablets day 3: Take 3 tablets day 4:Take 2 tablets day five: 5 Take 1 tablet day 6    Dispense:  21 tablet    Refill:  1   Red Flags discussed. The patient was given clear  instructions to go to ER or return to medical center if any red flags develop, symptoms do not improve, worsen or new problems develop. They verbalized understanding. .Follow treatment plan from office  if not improving or any worsening within 72 hours and also return to office or open medical facility at ANYTIME if any symptoms persist, change, or worsen or you have any further concerns or questions. Call 911 immediately for emergencies.  She is aware to follow-up in the office if her abdominal pain continues to persist for an in person evaluation.  Follow-up: Return in 6 months (on 12/10/2021).    Ashley Buffy, FNP

## 2021-06-12 ENCOUNTER — Telehealth: Payer: Self-pay

## 2021-06-12 ENCOUNTER — Other Ambulatory Visit
Admission: RE | Admit: 2021-06-12 | Discharge: 2021-06-12 | Disposition: A | Discharge status: Home / Self Care | Payer: 59 | Source: Home / Self Care | Attending: Adult Health | Admitting: Adult Health

## 2021-06-12 ENCOUNTER — Encounter: Payer: Self-pay | Admitting: Adult Health

## 2021-06-12 ENCOUNTER — Ambulatory Visit
Admission: RE | Admit: 2021-06-12 | Discharge: 2021-06-12 | Disposition: A | Discharge status: Home / Self Care | Payer: 59 | Source: Ambulatory Visit | Attending: Adult Health | Admitting: Adult Health

## 2021-06-12 ENCOUNTER — Other Ambulatory Visit: Payer: Self-pay | Admitting: Adult Health

## 2021-06-12 DIAGNOSIS — R110 Nausea: Secondary | ICD-10-CM | POA: Insufficient documentation

## 2021-06-12 DIAGNOSIS — T3695XA Adverse effect of unspecified systemic antibiotic, initial encounter: Secondary | ICD-10-CM | POA: Insufficient documentation

## 2021-06-12 DIAGNOSIS — R1031 Right lower quadrant pain: Secondary | ICD-10-CM | POA: Insufficient documentation

## 2021-06-12 DIAGNOSIS — T7840XA Allergy, unspecified, initial encounter: Secondary | ICD-10-CM | POA: Insufficient documentation

## 2021-06-12 DIAGNOSIS — Z6832 Body mass index (BMI) 32.0-32.9, adult: Secondary | ICD-10-CM | POA: Insufficient documentation

## 2021-06-12 DIAGNOSIS — R112 Nausea with vomiting, unspecified: Secondary | ICD-10-CM | POA: Insufficient documentation

## 2021-06-12 LAB — AMYLASE: Amylase: 53 U/L (ref 28–100)

## 2021-06-12 LAB — COMPREHENSIVE METABOLIC PANEL
ALT: 25 U/L (ref 0–44)
AST: 25 U/L (ref 15–41)
Albumin: 4.1 g/dL (ref 3.5–5.0)
Alkaline Phosphatase: 67 U/L (ref 38–126)
Anion gap: 6 (ref 5–15)
BUN: 13 mg/dL (ref 6–20)
CO2: 27 mmol/L (ref 22–32)
Calcium: 9.1 mg/dL (ref 8.9–10.3)
Chloride: 104 mmol/L (ref 98–111)
Creatinine, Ser: 0.67 mg/dL (ref 0.44–1.00)
GFR, Estimated: >60 mL/min (ref >60)
Glucose, Bld: 88 mg/dL (ref 70–99)
Potassium: 3.6 mmol/L (ref 3.5–5.1)
Sodium: 137 mmol/L (ref 135–145)
Total Bilirubin: 0.6 mg/dL (ref 0.3–1.2)
Total Protein: 7.2 g/dL (ref 6.5–8.1)

## 2021-06-12 LAB — VITAMIN D 25 HYDROXY (VIT D DEFICIENCY, FRACTURES): Vit D, 25-Hydroxy: 32.37 ng/mL (ref 30–100)

## 2021-06-12 LAB — CBC WITH DIFFERENTIAL/PLATELET
Abs Immature Granulocytes: 0.02 10*3/uL (ref 0.00–0.07)
Basophils Absolute: 0.1 10*3/uL (ref 0.0–0.1)
Basophils Relative: 1 %
Eosinophils Absolute: 0.2 10*3/uL (ref 0.0–0.5)
Eosinophils Relative: 3 %
HCT: 36.6 % (ref 36.0–46.0)
Hemoglobin: 11.9 g/dL — ABNORMAL LOW (ref 12.0–15.0)
Immature Granulocytes: 0 %
Lymphocytes Relative: 36 %
Lymphs Abs: 2.7 10*3/uL (ref 0.7–4.0)
MCH: 27.4 pg (ref 26.0–34.0)
MCHC: 32.5 g/dL (ref 30.0–36.0)
MCV: 84.1 fL (ref 80.0–100.0)
Monocytes Absolute: 0.5 10*3/uL (ref 0.1–1.0)
Monocytes Relative: 7 %
Neutro Abs: 3.9 10*3/uL (ref 1.7–7.7)
Neutrophils Relative %: 53 %
Platelets: 540 10*3/uL — ABNORMAL HIGH (ref 150–400)
RBC: 4.35 MIL/uL (ref 3.87–5.11)
RDW: 12.4 % (ref 11.5–15.5)
WBC: 7.4 10*3/uL (ref 4.0–10.5)
nRBC: 0 % (ref 0.0–0.2)

## 2021-06-12 LAB — IRON AND TIBC
Iron: 31 ug/dL (ref 28–170)
Saturation Ratios: 7 % — ABNORMAL LOW (ref 10.4–31.8)
TIBC: 462 ug/dL — ABNORMAL HIGH (ref 250–450)
UIBC: 431 ug/dL

## 2021-06-12 LAB — URINALYSIS, COMPLETE (UACMP) WITH MICROSCOPIC
Bacteria, UA: NONE SEEN
Bilirubin Urine: NEGATIVE
Glucose, UA: NEGATIVE mg/dL
Hgb urine dipstick: NEGATIVE
Ketones, ur: NEGATIVE mg/dL
Leukocytes,Ua: NEGATIVE
Nitrite: NEGATIVE
Protein, ur: NEGATIVE mg/dL
Specific Gravity, Urine: 1.013 (ref 1.005–1.030)
Squamous Epithelial / HPF: NONE SEEN (ref 0–5)
pH: 7 (ref 5.0–8.0)

## 2021-06-12 LAB — MAGNESIUM: Magnesium: 2 mg/dL (ref 1.7–2.4)

## 2021-06-12 LAB — VITAMIN B12: Vitamin B-12: 416 pg/mL (ref 180–914)

## 2021-06-12 LAB — FERRITIN: Ferritin: 7 ng/mL — ABNORMAL LOW (ref 11–307)

## 2021-06-12 LAB — LIPASE, BLOOD: Lipase: 38 U/L (ref 11–51)

## 2021-06-12 LAB — FOLATE: Folate: 10.7 ng/mL (ref >5.9)

## 2021-06-12 NOTE — Telephone Encounter (Signed)
[facility] called and stated that pt was there to have her CT scan done but was refusing the IV contrast. Verbal order was given to change order to without contrast since pt refused the IV and provider will be notified.

## 2021-06-12 NOTE — Progress Notes (Signed)
B12 is ok.  Vitamin D still within normal range but could be higher. Continue prescription.  CMP is within normal limits. Amylase ans lipase within normal limits.  Magnesium level within normal limits.  Folate ok.  Iron levels still low, would go back to daily iron as prescribed take with vitamin C or with apple juice, OJ etc with vitamin C and not with any other supplements within the 2 hours of taking. Ferritin is low.  Hemoglobin is lower, she has been having heavier menses, if already taking iron everyday without missed doses we may need to change dose. Let me know? Also any dark stools, rectal bleeding  or red blood in stools  ? Fecal occult blood test of stool advised  Will need recheck of iron tibc ferritin and CBC in 3 months.  Urinalysis is okay, mucus is seen likely normal discharge. Other labs still pending.   Platelets elevated still. Has seen hematology in the past.

## 2021-06-12 NOTE — Addendum Note (Signed)
Addended by: Doreen Beam on: 06/12/2021 07:15 AM   Modules accepted: Orders

## 2021-06-12 NOTE — Addendum Note (Signed)
Addended by: Doreen Beam on: 06/12/2021 06:52 AM   Modules accepted: Orders

## 2021-06-12 NOTE — Progress Notes (Signed)
CT of abdomen shows no acute findings.  Small kidney stones in both kidneys.  Stable cysts of liver.  Follow treatment plan from office  if not improving or any worsening within 72 hours and also return to office or open medical facility at ANYTIME if any symptoms persist, change, or worsen or you have any further concerns or questions. Call 911 immediately for emergencies.

## 2021-06-13 LAB — URINE CULTURE: Culture: NO GROWTH

## 2021-06-13 LAB — CALCITRIOL (1,25 DI-OH VIT D): Vit D, 1,25-Dihydroxy: 68.2 pg/mL (ref 24.8–81.5)

## 2021-06-13 NOTE — Progress Notes (Signed)
No growth urine culture.

## 2021-06-14 NOTE — Progress Notes (Signed)
Vitamin d is within normal limits.

## 2021-06-17 LAB — VITAMIN B1: Vitamin B1 (Thiamine): 164.3 nmol/L (ref 66.5–200.0)

## 2021-06-17 LAB — COPPER, SERUM: Copper: 103 ug/dL (ref 80–158)

## 2021-06-17 NOTE — Progress Notes (Signed)
Copper within normal limits.

## 2021-06-18 ENCOUNTER — Telehealth: Payer: Self-pay

## 2021-06-18 LAB — HEAVY METALS, BLOOD
Arsenic: 2 ug/L (ref 0–9)
Lead: <1 ug/dL (ref 0.0–3.4)
Mercury: <1 ug/L (ref 0.0–14.9)

## 2021-06-18 NOTE — Progress Notes (Signed)
Heavy metals within normal limits.

## 2021-06-18 NOTE — Telephone Encounter (Signed)
LMTCB for lab results.  

## 2021-07-08 ENCOUNTER — Encounter: Payer: Self-pay | Admitting: Adult Health

## 2021-07-08 DIAGNOSIS — R110 Nausea: Secondary | ICD-10-CM

## 2021-07-08 DIAGNOSIS — K219 Gastro-esophageal reflux disease without esophagitis: Secondary | ICD-10-CM

## 2021-07-08 DIAGNOSIS — G43109 Migraine with aura, not intractable, without status migrainosus: Secondary | ICD-10-CM

## 2021-07-08 DIAGNOSIS — Z862 Personal history of diseases of the blood and blood-forming organs and certain disorders involving the immune mechanism: Secondary | ICD-10-CM

## 2021-07-08 DIAGNOSIS — G43829 Menstrual migraine, not intractable, without status migrainosus: Secondary | ICD-10-CM

## 2021-07-08 MED ORDER — FEROSUL 325 (65 FE) MG PO TABS: 325.0000 mg | ORAL_TABLET | Freq: Every day | ORAL | 0 refills | Status: AC

## 2021-07-08 MED ORDER — PANTOPRAZOLE SODIUM 40 MG PO TBEC: DELAYED_RELEASE_TABLET | ORAL | 1 refills | Status: DC

## 2021-07-08 MED ORDER — ONDANSETRON 8 MG PO TBDP: 4.0000 mg | ORAL_TABLET | Freq: Three times a day (TID) | ORAL | 1 refills | Status: DC | PRN

## 2021-07-08 MED ORDER — FEROSUL 325 (65 FE) MG PO TABS: 325.0000 mg | ORAL_TABLET | ORAL | 0 refills | Status: DC

## 2021-07-08 MED ORDER — KETOROLAC TROMETHAMINE 10 MG PO TABS: 5.0000 mg | ORAL_TABLET | Freq: Three times a day (TID) | ORAL | 0 refills | Status: DC | PRN

## 2021-07-08 NOTE — Telephone Encounter (Signed)
Holly from [employer] called about pantoprazole (PROTONIX) 40 MG tablet. Earnest Bailey states that does the provider want pt to take one tablet twice daily meaning twice a day. Also the medication Caroleen Hamman says dispense as written because its coming up as FEROSUL 325 (65 Fe) MG tablet. Earnest Bailey states the nbc number when she type it in its generic so she wants to know if she should dispense the generic of Fersosul or the brand name. [phone] [id number]

## 2021-07-08 NOTE — Addendum Note (Signed)
Addended by: Doreen Beam on: 07/08/2021 04:04 PM   Modules accepted: Orders

## 2021-07-08 NOTE — Telephone Encounter (Signed)
Please let pharmacy know new prescriptions sent. Instructed on new script sent electronically of the below:  Protonix 40 mg by mouth once a day- clarified, no longer twice a day this was from GI in the past. She is now on Protonix 40 mg once daily by mouth.  Iron ok to do generic she does not have insurance currently.

## 2021-07-13 NOTE — Progress Notes (Signed)
Negative test.

## 2021-07-14 LAB — MISC LABCORP TEST (SEND OUT)
Labcorp test code: 1800
Labcorp test code: 70115
Labcorp test code: 706200

## 2021-07-21 ENCOUNTER — Telehealth: Payer: Self-pay | Admitting: Adult Health

## 2021-07-21 ENCOUNTER — Telehealth: Payer: Self-pay

## 2021-07-21 NOTE — Telephone Encounter (Signed)
Lvm for patient to return call to start video visit and discuss reason for visit.

## 2021-08-07 ENCOUNTER — Encounter: Payer: Self-pay | Admitting: Adult Health

## 2021-08-11 ENCOUNTER — Encounter: Payer: Self-pay | Admitting: Adult Health

## 2021-08-11 ENCOUNTER — Telehealth: Payer: Self-pay | Admitting: Adult Health

## 2021-08-11 NOTE — Progress Notes (Deleted)
Virtual Visit via Video Note  I connected with [name] on 08/11/21 at  4:30 PM EST by a video enabled telemedicine application and verified that I am speaking with the correct person using two identifiers.  Location: Patient: at home  Provider: Provider: Provider's office at  [facility], [place].      I discussed the limitations of evaluation and management by telemedicine and the availability of in person appointments. The patient expressed understanding and agreed to proceed.  History of Present Illness: Patient is here for follow-up via video visit. She is currently on iron as recorded on medication list for iron deficiency anemia.  She does have heavy menses.  She does have a OB/GYN.  Patient denies any pregnancy.  Lab orders were placed for [facility] at last visit.   Observations/Objective:   Patient is alert and oriented and responsive to questions Engages in conversation with provider. Speaks in full sentences without any pauses without any shortness of breath or distress.   Assessment and Plan:   Follow Up Instructions:    I discussed the assessment and treatment plan with the patient. The patient was provided an opportunity to ask questions and all were answered. The patient agreed with the plan and demonstrated an understanding of the instructions.   The patient was advised to call back or seek an in-person evaluation if the symptoms worsen or if the condition fails to improve as anticipated.  I provided *** minutes of non-face-to-face time during this encounter.   Marcille Buffy, FNP

## 2021-08-17 ENCOUNTER — Encounter: Payer: Self-pay | Admitting: Adult Health

## 2021-08-18 ENCOUNTER — Encounter: Payer: Self-pay | Admitting: Adult Health

## 2021-08-18 ENCOUNTER — Other Ambulatory Visit: Payer: Self-pay

## 2021-08-18 ENCOUNTER — Telehealth: Payer: Self-pay | Admitting: Adult Health

## 2021-08-18 DIAGNOSIS — Z862 Personal history of diseases of the blood and blood-forming organs and certain disorders involving the immune mechanism: Secondary | ICD-10-CM

## 2021-08-18 DIAGNOSIS — F419 Anxiety disorder, unspecified: Secondary | ICD-10-CM

## 2021-08-18 DIAGNOSIS — R5383 Other fatigue: Secondary | ICD-10-CM

## 2021-08-18 DIAGNOSIS — R7989 Other specified abnormal findings of blood chemistry: Secondary | ICD-10-CM

## 2021-08-18 DIAGNOSIS — D509 Iron deficiency anemia, unspecified: Secondary | ICD-10-CM

## 2021-08-18 DIAGNOSIS — E569 Vitamin deficiency, unspecified: Secondary | ICD-10-CM

## 2021-08-18 DIAGNOSIS — E785 Hyperlipidemia, unspecified: Secondary | ICD-10-CM

## 2021-08-24 ENCOUNTER — Encounter: Payer: Self-pay | Admitting: Adult Health

## 2021-08-24 ENCOUNTER — Telehealth (INDEPENDENT_AMBULATORY_CARE_PROVIDER_SITE_OTHER): Payer: Self-pay | Admitting: Adult Health

## 2021-08-24 DIAGNOSIS — L709 Acne, unspecified: Secondary | ICD-10-CM

## 2021-08-24 DIAGNOSIS — F419 Anxiety disorder, unspecified: Secondary | ICD-10-CM

## 2021-08-24 DIAGNOSIS — G43829 Menstrual migraine, not intractable, without status migrainosus: Secondary | ICD-10-CM

## 2021-08-24 DIAGNOSIS — D509 Iron deficiency anemia, unspecified: Secondary | ICD-10-CM

## 2021-08-24 NOTE — Progress Notes (Unsigned)
Virtual Visit via Video Note  I connected with [name] on 08/24/21 at  4:30 PM EDT by a video enabled telemedicine application and verified that I am speaking with the correct person using two identifiers.  Location: Patient: at home  Provider: Provider: Provider's office at  [facility], [place].    I discussed the limitations of evaluation and management by telemedicine and the availability of in person appointments. The patient expressed understanding and agreed to proceed.  History of Present Illness:   Patient is a 36 year old female in no acute distress who comes to the office for a follow up.  She needs refill on her acne medication - doxycycline.   No LMP recorded.08/24/21 currently.  Needs refill of medications.  She is due for labs she is self pay.   Numbness in left great toe- chronic. Has had follow up.     Observations/Objective:   Patient is alert and oriented and responsive to questions Engages in conversation with provider. Speaks in full sentences without any pauses without any shortness of breath or distress.     Assessment and Plan:   Follow Up Instructions:   Advised in person evaluation at anytime is advised if any symptoms do not improve, worsen or change at any given time.  Red Flags discussed. The patient was given clear instructions to go to ER or return to medical center if any red flags develop, symptoms do not improve, worsen or new problems develop. They verbalized understanding.    I discussed the assessment and treatment plan with the patient. The patient was provided an opportunity to ask questions and all were answered. The patient agreed with the plan and demonstrated an understanding of the instructions.   The patient was advised to call back or seek an in-person evaluation if the symptoms worsen or if the condition fails to improve as anticipated.  I provided *** minutes of non-face-to-face time  during this encounter.   Marcille Buffy, FNP

## 2021-08-25 ENCOUNTER — Other Ambulatory Visit: Payer: Self-pay

## 2021-08-25 ENCOUNTER — Other Ambulatory Visit: Payer: Self-pay | Admitting: Adult Health

## 2021-08-25 DIAGNOSIS — F419 Anxiety disorder, unspecified: Secondary | ICD-10-CM

## 2021-08-25 DIAGNOSIS — L709 Acne, unspecified: Secondary | ICD-10-CM | POA: Insufficient documentation

## 2021-08-25 DIAGNOSIS — K219 Gastro-esophageal reflux disease without esophagitis: Secondary | ICD-10-CM

## 2021-08-25 DIAGNOSIS — G43109 Migraine with aura, not intractable, without status migrainosus: Secondary | ICD-10-CM

## 2021-08-25 DIAGNOSIS — G43829 Menstrual migraine, not intractable, without status migrainosus: Secondary | ICD-10-CM

## 2021-08-25 MED ORDER — DOXYCYCLINE HYCLATE 100 MG PO TABS: 100.0000 mg | ORAL_TABLET | Freq: Two times a day (BID) | ORAL | 0 refills | Status: AC

## 2021-08-25 MED ORDER — ONDANSETRON 8 MG PO TBDP: 4.0000 mg | ORAL_TABLET | Freq: Three times a day (TID) | ORAL | 1 refills | Status: AC | PRN

## 2021-08-25 MED ORDER — KETOROLAC TROMETHAMINE 10 MG PO TABS: 5.0000 mg | ORAL_TABLET | Freq: Three times a day (TID) | ORAL | 0 refills | Status: AC | PRN

## 2021-08-25 MED ORDER — CYCLOBENZAPRINE HCL 10 MG PO TABS: ORAL_TABLET | ORAL | 0 refills | Status: AC

## 2021-08-25 MED ORDER — FLUCONAZOLE 150 MG PO TABS: ORAL_TABLET | ORAL | 0 refills | Status: AC

## 2021-08-25 MED ORDER — LORAZEPAM 1 MG PO TABS: 1.0000 mg | ORAL_TABLET | Freq: Three times a day (TID) | ORAL | 0 refills | Status: AC | PRN

## 2021-08-25 MED ORDER — PANTOPRAZOLE SODIUM 40 MG PO TBEC: DELAYED_RELEASE_TABLET | ORAL | 1 refills | Status: AC

## 2021-08-25 MED ORDER — SUMATRIPTAN SUCCINATE 50 MG PO TABS: ORAL_TABLET | ORAL | 0 refills | Status: AC

## 2021-08-25 NOTE — Addendum Note (Signed)
Addended by: Leeanne Rio on: 08/25/2021 11:38 AM ? ? Modules accepted: Orders ? ?

## 2021-08-25 NOTE — Addendum Note (Signed)
Addended by: Doreen Beam on: 08/25/2021 12:23 PM ? ? Modules accepted: Orders ? ?

## 2021-08-26 ENCOUNTER — Encounter: Payer: Self-pay | Admitting: Adult Health

## 2021-08-26 ENCOUNTER — Other Ambulatory Visit (INDEPENDENT_AMBULATORY_CARE_PROVIDER_SITE_OTHER): Payer: Self-pay

## 2021-08-26 ENCOUNTER — Other Ambulatory Visit: Payer: Self-pay

## 2021-08-26 ENCOUNTER — Telehealth: Payer: Self-pay

## 2021-08-26 DIAGNOSIS — E785 Hyperlipidemia, unspecified: Secondary | ICD-10-CM

## 2021-08-26 DIAGNOSIS — F419 Anxiety disorder, unspecified: Secondary | ICD-10-CM

## 2021-08-26 DIAGNOSIS — D509 Iron deficiency anemia, unspecified: Secondary | ICD-10-CM

## 2021-08-26 DIAGNOSIS — R7989 Other specified abnormal findings of blood chemistry: Secondary | ICD-10-CM

## 2021-08-26 DIAGNOSIS — E569 Vitamin deficiency, unspecified: Secondary | ICD-10-CM

## 2021-08-26 DIAGNOSIS — Z862 Personal history of diseases of the blood and blood-forming organs and certain disorders involving the immune mechanism: Secondary | ICD-10-CM

## 2021-08-26 DIAGNOSIS — R5383 Other fatigue: Secondary | ICD-10-CM

## 2021-08-26 LAB — COMPREHENSIVE METABOLIC PANEL
ALT: 15 U/L (ref 0–35)
AST: 15 U/L (ref 0–37)
Albumin: 4.3 g/dL (ref 3.5–5.2)
Alkaline Phosphatase: 70 U/L (ref 39–117)
BUN: 12 mg/dL (ref 6–23)
CO2: 27 mEq/L (ref 19–32)
Calcium: 9.3 mg/dL (ref 8.4–10.5)
Chloride: 103 mEq/L (ref 96–112)
Creatinine, Ser: 0.83 mg/dL (ref 0.40–1.20)
GFR: 91.64 mL/min (ref >60.00)
Glucose, Bld: 79 mg/dL (ref 70–99)
Potassium: 4.2 mEq/L (ref 3.5–5.1)
Sodium: 136 mEq/L (ref 135–145)
Total Bilirubin: 0.5 mg/dL (ref 0.2–1.2)
Total Protein: 6.7 g/dL (ref 6.0–8.3)

## 2021-08-26 LAB — CBC WITH DIFFERENTIAL/PLATELET
Basophils Absolute: 0 10*3/uL (ref 0.0–0.1)
Basophils Relative: 0.4 % (ref 0.0–3.0)
Eosinophils Absolute: 0.2 10*3/uL (ref 0.0–0.7)
Eosinophils Relative: 2 % (ref 0.0–5.0)
HCT: 37.6 % (ref 36.0–46.0)
Hemoglobin: 12.2 g/dL (ref 12.0–15.0)
Lymphocytes Relative: 29.1 % (ref 12.0–46.0)
Lymphs Abs: 2.5 10*3/uL (ref 0.7–4.0)
MCHC: 32.4 g/dL (ref 30.0–36.0)
MCV: 80.2 fl (ref 78.0–100.0)
Monocytes Absolute: 0.7 10*3/uL (ref 0.1–1.0)
Monocytes Relative: 7.7 % (ref 3.0–12.0)
Neutro Abs: 5.2 10*3/uL (ref 1.4–7.7)
Neutrophils Relative %: 60.8 % (ref 43.0–77.0)
Platelets: 448 10*3/uL — ABNORMAL HIGH (ref 150.0–400.0)
RBC: 4.69 Mil/uL (ref 3.87–5.11)
RDW: 13.5 % (ref 11.5–15.5)
WBC: 8.6 10*3/uL (ref 4.0–10.5)

## 2021-08-26 LAB — IBC + FERRITIN
Ferritin: 5.4 ng/mL — ABNORMAL LOW (ref 10.0–291.0)
Iron: 87 ug/dL (ref 42–145)
Saturation Ratios: 18.8 % — ABNORMAL LOW (ref 20.0–50.0)
TIBC: 463.4 ug/dL — ABNORMAL HIGH (ref 250.0–450.0)
Transferrin: 331 mg/dL (ref 212.0–360.0)

## 2021-08-26 LAB — URINALYSIS, ROUTINE W REFLEX MICROSCOPIC
Bilirubin Urine: NEGATIVE
Hgb urine dipstick: NEGATIVE
Ketones, ur: NEGATIVE
Leukocytes,Ua: NEGATIVE
Nitrite: NEGATIVE
RBC / HPF: NONE SEEN (ref 0–?)
Specific Gravity, Urine: 1.015 (ref 1.000–1.030)
Total Protein, Urine: NEGATIVE
Urine Glucose: NEGATIVE
Urobilinogen, UA: 0.2 (ref 0.0–1.0)
pH: 7 (ref 5.0–8.0)

## 2021-08-26 NOTE — Telephone Encounter (Signed)
-----   Message from Doreen Beam, [place] sent at 08/26/2021  3:24 PM EDT ----- ?Hemoglobin slightly improved as well as platelets, continue iron as prescribed, would like to see her hemoglobin nearer to 14-15 and likely platelets will continue to improve. Iron saturations are improved but still not normal. Continue Ferosul 325 (65 Fe) once daily by mouth. Take measures to avoid constipation as previously discussed. Recheck iron levels and CBC in around 3 months.  ?Urinalysis is within normal limits.  ?CMP shows normal electrolytes, kidney and liver function. ?

## 2021-08-26 NOTE — Progress Notes (Signed)
Hemoglobin slightly improved as well as platelets, continue iron as prescribed, would like to see her hemoglobin nearer to 14-15 and likely platelets will continue to improve. Iron saturations are improved but still not normal. Continue Ferosul 325 (65 Fe) once daily by mouth. Take measures to avoid constipation as previously discussed. Recheck iron levels and CBC in around 3 months.  ?Urinalysis is within normal limits.  ?CMP shows normal electrolytes, kidney and liver function.

## 2021-09-03 ENCOUNTER — Telehealth: Payer: Self-pay | Admitting: Adult Health

## 2021-09-03 ENCOUNTER — Other Ambulatory Visit: Payer: Self-pay | Admitting: Adult Health

## 2021-09-03 ENCOUNTER — Encounter: Payer: Self-pay | Admitting: Adult Health

## 2021-09-03 DIAGNOSIS — J302 Other seasonal allergic rhinitis: Secondary | ICD-10-CM

## 2021-09-03 MED ORDER — METHYLPREDNISOLONE 4 MG PO TBPK: ORAL_TABLET | ORAL | 0 refills | Status: AC

## 2021-09-03 NOTE — Progress Notes (Signed)
Unable to connect to patient video or by phone. Not seen.  ?

## 2021-09-04 ENCOUNTER — Telehealth: Payer: Self-pay | Admitting: Family

## 2021-09-04 NOTE — Telephone Encounter (Signed)
Patient's insurance, [employer] is requesting clinical notes from the 06/11/2021 x-rays. Please fax to [phone], Ref # to put on fax sheet when faxing is - [phone] ?

## 2021-09-11 NOTE — Telephone Encounter (Signed)
Faxed to [employer].  ?
